# Patient Record
Sex: Female | Born: 1970
Health system: Southern US, Community
[De-identification: ages and names within clinical notes are randomized; demographics above are authoritative.]

## PROBLEM LIST (undated history)

## (undated) DIAGNOSIS — A0472 Enterocolitis due to Clostridium difficile, not specified as recurrent: Secondary | ICD-10-CM

## (undated) DIAGNOSIS — E785 Hyperlipidemia, unspecified: Secondary | ICD-10-CM

## (undated) DIAGNOSIS — E119 Type 2 diabetes mellitus without complications: Secondary | ICD-10-CM

## (undated) DIAGNOSIS — Z9889 Other specified postprocedural states: Secondary | ICD-10-CM

## (undated) DIAGNOSIS — R51 Headache: Secondary | ICD-10-CM

## (undated) DIAGNOSIS — Z800 Family history of malignant neoplasm of digestive organs: Secondary | ICD-10-CM

## (undated) DIAGNOSIS — M5136 Other intervertebral disc degeneration, lumbar region: Secondary | ICD-10-CM

## (undated) DIAGNOSIS — I10 Essential (primary) hypertension: Secondary | ICD-10-CM

## (undated) DIAGNOSIS — Z8041 Family history of malignant neoplasm of ovary: Secondary | ICD-10-CM

## (undated) DIAGNOSIS — M1990 Unspecified osteoarthritis, unspecified site: Secondary | ICD-10-CM

## (undated) DIAGNOSIS — E282 Polycystic ovarian syndrome: Secondary | ICD-10-CM

## (undated) DIAGNOSIS — Z803 Family history of malignant neoplasm of breast: Principal | ICD-10-CM

## (undated) DIAGNOSIS — M51369 Other intervertebral disc degeneration, lumbar region without mention of lumbar back pain or lower extremity pain: Secondary | ICD-10-CM

## (undated) DIAGNOSIS — K219 Gastro-esophageal reflux disease without esophagitis: Secondary | ICD-10-CM

## (undated) DIAGNOSIS — K5792 Diverticulitis of intestine, part unspecified, without perforation or abscess without bleeding: Secondary | ICD-10-CM

## (undated) DIAGNOSIS — N189 Chronic kidney disease, unspecified: Secondary | ICD-10-CM

## (undated) DIAGNOSIS — R112 Nausea with vomiting, unspecified: Secondary | ICD-10-CM

## (undated) HISTORY — DX: Family history of malignant neoplasm of digestive organs: Z80.0

## (undated) HISTORY — DX: Family history of malignant neoplasm of breast: Z80.3

## (undated) HISTORY — DX: Family history of malignant neoplasm of ovary: Z80.41

---

## 1992-05-21 HISTORY — PX: CHOLECYSTECTOMY: SHX55

## 1992-05-21 HISTORY — PX: DILATION AND CURETTAGE OF UTERUS: SHX78

## 1996-05-21 HISTORY — PX: TONSILLECTOMY: SUR1361

## 1997-05-21 HISTORY — PX: LYMPH NODE DISSECTION: SHX5087

## 2004-06-15 ENCOUNTER — Ambulatory Visit: Payer: Self-pay | Admitting: Oncology

## 2004-06-21 ENCOUNTER — Ambulatory Visit: Payer: Self-pay | Admitting: Oncology

## 2004-07-19 ENCOUNTER — Ambulatory Visit: Payer: Self-pay | Admitting: Oncology

## 2004-08-15 ENCOUNTER — Ambulatory Visit: Payer: Self-pay | Admitting: Anesthesiology

## 2004-11-08 ENCOUNTER — Ambulatory Visit: Payer: Self-pay | Admitting: General Practice

## 2005-02-01 ENCOUNTER — Ambulatory Visit: Payer: Self-pay | Admitting: General Practice

## 2005-03-28 ENCOUNTER — Ambulatory Visit: Payer: Self-pay | Admitting: Anesthesiology

## 2005-06-13 ENCOUNTER — Ambulatory Visit: Payer: Self-pay | Admitting: Internal Medicine

## 2005-09-06 ENCOUNTER — Ambulatory Visit: Payer: Self-pay | Admitting: Anesthesiology

## 2005-11-24 ENCOUNTER — Emergency Department: Payer: Self-pay | Admitting: Emergency Medicine

## 2005-12-05 ENCOUNTER — Encounter: Payer: Self-pay | Admitting: Internal Medicine

## 2005-12-18 ENCOUNTER — Ambulatory Visit: Payer: Self-pay | Admitting: Internal Medicine

## 2005-12-19 ENCOUNTER — Encounter: Payer: Self-pay | Admitting: Internal Medicine

## 2006-02-06 ENCOUNTER — Ambulatory Visit: Payer: Self-pay | Admitting: Internal Medicine

## 2006-02-18 ENCOUNTER — Ambulatory Visit: Payer: Self-pay | Admitting: Internal Medicine

## 2006-02-27 ENCOUNTER — Ambulatory Visit: Payer: Self-pay | Admitting: Anesthesiology

## 2006-08-15 ENCOUNTER — Ambulatory Visit: Payer: Self-pay | Admitting: Anesthesiology

## 2006-08-28 ENCOUNTER — Ambulatory Visit: Payer: Self-pay | Admitting: Internal Medicine

## 2007-01-22 ENCOUNTER — Ambulatory Visit: Payer: Self-pay | Admitting: Anesthesiology

## 2007-02-09 ENCOUNTER — Ambulatory Visit: Payer: Self-pay | Admitting: Family Medicine

## 2007-02-21 ENCOUNTER — Ambulatory Visit: Payer: Self-pay | Admitting: Internal Medicine

## 2007-02-25 ENCOUNTER — Ambulatory Visit: Payer: Self-pay | Admitting: Anesthesiology

## 2007-02-28 ENCOUNTER — Ambulatory Visit: Payer: Self-pay | Admitting: Internal Medicine

## 2007-05-22 HISTORY — PX: ABDOMINAL HYSTERECTOMY: SHX81

## 2007-08-27 ENCOUNTER — Ambulatory Visit: Payer: Self-pay | Admitting: Anesthesiology

## 2007-09-29 ENCOUNTER — Ambulatory Visit: Payer: Self-pay | Admitting: Pain Medicine

## 2007-10-22 ENCOUNTER — Ambulatory Visit: Payer: Self-pay | Admitting: Internal Medicine

## 2007-10-30 ENCOUNTER — Ambulatory Visit: Payer: Self-pay | Admitting: Anesthesiology

## 2008-04-05 ENCOUNTER — Ambulatory Visit: Payer: Self-pay | Admitting: Unknown Physician Specialty

## 2008-04-08 ENCOUNTER — Inpatient Hospital Stay: Payer: Self-pay | Admitting: Unknown Physician Specialty

## 2008-05-10 ENCOUNTER — Ambulatory Visit: Payer: Self-pay | Admitting: Anesthesiology

## 2008-10-12 ENCOUNTER — Ambulatory Visit: Payer: Self-pay | Admitting: Internal Medicine

## 2008-11-23 ENCOUNTER — Ambulatory Visit: Payer: Self-pay | Admitting: Anesthesiology

## 2008-12-07 ENCOUNTER — Ambulatory Visit: Payer: Self-pay | Admitting: Internal Medicine

## 2009-03-25 ENCOUNTER — Ambulatory Visit: Payer: Self-pay | Admitting: Internal Medicine

## 2009-05-26 ENCOUNTER — Ambulatory Visit: Payer: Self-pay | Admitting: Anesthesiology

## 2009-11-23 ENCOUNTER — Ambulatory Visit: Payer: Self-pay | Admitting: Anesthesiology

## 2010-02-09 ENCOUNTER — Ambulatory Visit: Payer: Self-pay | Admitting: Internal Medicine

## 2010-05-08 ENCOUNTER — Ambulatory Visit: Payer: Self-pay | Admitting: Anesthesiology

## 2010-11-13 ENCOUNTER — Ambulatory Visit: Payer: Self-pay

## 2011-01-16 ENCOUNTER — Ambulatory Visit: Payer: Self-pay | Admitting: Anesthesiology

## 2011-04-17 ENCOUNTER — Ambulatory Visit: Payer: Self-pay | Admitting: Internal Medicine

## 2011-06-20 ENCOUNTER — Ambulatory Visit: Payer: Self-pay | Admitting: Anesthesiology

## 2012-02-04 ENCOUNTER — Ambulatory Visit: Payer: Self-pay | Admitting: Anesthesiology

## 2012-02-14 ENCOUNTER — Ambulatory Visit: Payer: Self-pay

## 2012-02-14 LAB — URINALYSIS, COMPLETE
Blood: NEGATIVE
Glucose,UR: NEGATIVE mg/dL (ref 0–75)
Ketone: NEGATIVE
Protein: NEGATIVE
Specific Gravity: 1.01 (ref 1.003–1.030)

## 2012-03-16 ENCOUNTER — Ambulatory Visit: Payer: Self-pay

## 2012-04-07 ENCOUNTER — Ambulatory Visit: Payer: Self-pay | Admitting: Anesthesiology

## 2012-06-25 ENCOUNTER — Ambulatory Visit: Payer: Self-pay | Admitting: Internal Medicine

## 2012-08-04 ENCOUNTER — Ambulatory Visit: Payer: Self-pay | Admitting: Anesthesiology

## 2012-11-18 ENCOUNTER — Ambulatory Visit: Payer: Self-pay | Admitting: Anesthesiology

## 2013-01-21 ENCOUNTER — Ambulatory Visit: Payer: Self-pay | Admitting: Orthopedic Surgery

## 2013-02-05 ENCOUNTER — Encounter (HOSPITAL_COMMUNITY)
Admission: RE | Admit: 2013-02-05 | Discharge: 2013-02-05 | Disposition: A | Discharge status: Home / Self Care | Payer: 59 | Source: Ambulatory Visit | Attending: Orthopedic Surgery | Admitting: Orthopedic Surgery

## 2013-02-05 ENCOUNTER — Encounter (HOSPITAL_COMMUNITY)
Admission: RE | Admit: 2013-02-05 | Discharge: 2013-02-05 | Disposition: A | Discharge status: Home / Self Care | Payer: 59 | Source: Ambulatory Visit | Attending: Anesthesiology | Admitting: Anesthesiology

## 2013-02-05 ENCOUNTER — Encounter (HOSPITAL_COMMUNITY): Payer: Self-pay

## 2013-02-05 ENCOUNTER — Encounter (HOSPITAL_COMMUNITY): Payer: Self-pay | Admitting: Pharmacy Technician

## 2013-02-05 DIAGNOSIS — Z01812 Encounter for preprocedural laboratory examination: Secondary | ICD-10-CM | POA: Insufficient documentation

## 2013-02-05 DIAGNOSIS — Z01810 Encounter for preprocedural cardiovascular examination: Secondary | ICD-10-CM | POA: Insufficient documentation

## 2013-02-05 DIAGNOSIS — Z01818 Encounter for other preprocedural examination: Secondary | ICD-10-CM | POA: Insufficient documentation

## 2013-02-05 HISTORY — DX: Essential (primary) hypertension: I10

## 2013-02-05 HISTORY — DX: Nausea with vomiting, unspecified: R11.2

## 2013-02-05 HISTORY — DX: Unspecified osteoarthritis, unspecified site: M19.90

## 2013-02-05 HISTORY — DX: Type 2 diabetes mellitus without complications: E11.9

## 2013-02-05 HISTORY — DX: Headache: R51

## 2013-02-05 HISTORY — DX: Other specified postprocedural states: Z98.890

## 2013-02-05 HISTORY — DX: Gastro-esophageal reflux disease without esophagitis: K21.9

## 2013-02-05 HISTORY — DX: Chronic kidney disease, unspecified: N18.9

## 2013-02-05 LAB — BASIC METABOLIC PANEL
BUN: 15 mg/dL (ref 6–23)
GFR calc Af Amer: >90 mL/min (ref >90)
GFR calc non Af Amer: >90 mL/min (ref >90)
Potassium: 4 mEq/L (ref 3.5–5.1)
Sodium: 134 mEq/L — ABNORMAL LOW (ref 135–145)

## 2013-02-05 LAB — CBC
MCH: 30.3 pg (ref 26.0–34.0)
MCV: 88.7 fL (ref 78.0–100.0)
Platelets: 251 10*3/uL (ref 150–400)
RBC: 4.26 MIL/uL (ref 3.87–5.11)
RDW: 12.5 % (ref 11.5–15.5)
WBC: 7.5 10*3/uL (ref 4.0–10.5)

## 2013-02-05 NOTE — Anesthesia Preprocedure Evaluation (Addendum)
Anesthesia Evaluation  Patient identified by MRN, date of birth, ID band Patient awake    History of Anesthesia Complications (+) PONV  Airway Mallampati: II TM Distance: >3 FB Neck ROM: Limited    Dental   Pulmonary  breath sounds clear to auscultation        Cardiovascular hypertension, Rhythm:Regular Rate:Normal     Neuro/Psych  Headaches,    GI/Hepatic GERD-  ,  Endo/Other  diabetesMorbid obesity  Renal/GU      Musculoskeletal   Abdominal (+) + obese,   Peds  Hematology   Anesthesia Other Findings   Reproductive/Obstetrics                           Anesthesia Physical Anesthesia Plan  ASA: III  Anesthesia Plan: General   Post-op Pain Management:    Induction: Intravenous  Airway Management Planned: Oral ETT  Additional Equipment:   Intra-op Plan:   Post-operative Plan: Extubation in OR  Informed Consent: I have reviewed the patients History and Physical, chart, labs and discussed the procedure including the risks, benefits and alternatives for the proposed anesthesia with the patient or authorized representative who has indicated his/her understanding and acceptance.     Plan Discussed with: CRNA and Surgeon  Anesthesia Plan Comments: (See my note regarding anesthesia concerns: IV access, N/V.  Shonna Chock, PA-C)       Anesthesia Quick Evaluation

## 2013-02-05 NOTE — Progress Notes (Signed)
Anesthesia Note:  Patient is a 42 year old female scheduled for left shoulder arthroscopy with subacromial decompression, distal clavicle resection, possible bicep tenotomy, possible rotator cuff repair on 02/12/13 by Dr. Rennis Chris.  History includes Cat-scratch fever with right neck cervical LN dissection in 1991 with development of RUE lymphedema 10 years later, HTN, DM2 (reported A1C of 6.1), morbid obesity (BMI 41.98; 116 kg), GERD, headaches, non-smoker, arthritis, tonsillectomy, cholecystectomy, hysterectomy.  PCP is Dr. Bethann Punches.  She is an Nurse, learning disability at [employer].  Anesthesia concerns:  Her RUE is restricted due to lymphedema history.  She reports that Dr. Rennis Chris said she could not have a PIV in her LUE at all (even in hand) since he will be working on her left shoulder.  She would like an IV in her foot for this procedure.  She also requests that if she receives propofol she will need Zofran preoperatively to prevent N/V.    EKG on 02/05/13 showed NSR, cannot rule out anterior infarct. She is unsure if she has ever had a prior EKG.  She denies history of stress and echo.  She denies SOB or recent history of chest pain.  She is able to still work on the [employer] floor. Up until a month ago when she began having shoulder issues, she was going to the gym and working with a Psychologist, educational.  She was able to lose 80 lb by doing 30-40 minutes of cardio and 60 minutes of weight training.  She denied any chest pain during these sessions.  She has had recent good exercise tolerance > 4 METS, so I do not think she will require further cardiac evaluation at this time.  Preoperative CXR and labs noted.  Reviewed foot IV request with anesthesiologists Dr. Jacklynn Bue and Dr. Michelle Piper.  Will attempt to obtain IV access in her foot on the day of surgery, but if unsuccessful a neck IV will need to be considered.  Patient updated, but she is adamant that she only wants a left hand IV (which Dr. Rennis Chris said "no") or a foot IV.  She will be  further evaluated by her assigned anesthesiologist on the day of surgery to discuss the definitve anesthesia plan.  Velna Ochs [facility] Stay [facility]/Anesthesiology Phone [phone] 02/05/2013 3:04 PM

## 2013-02-05 NOTE — Pre-Procedure Instructions (Signed)
KAMYLAH MANZO  02/05/2013   Your procedure is scheduled on:  Thursday February 12, 2013  Report to [facility] at 0800 AM.  Call this number if you have problems the morning of surgery: [phone]   Remember:   Do not eat food or drink liquids after midnight Wednesday   Take these medicines the morning of surgery with A SIP OF WATER: None. Take medication at night.   Do not wear jewelry, make-up or nail polish.  Do not wear lotions, powders, or perfumes. You may wear deodorant.  Do not shave 48 hours prior to surgery.   Do not bring valuables to the hospital.  [facility] is not responsible for any belongings or valuables.  Contacts, dentures or bridgework may not be worn into surgery.  Leave suitcase in the car. After surgery it may be brought to your room.  For patients admitted to the hospital, checkout time is 11:00 AM the day of discharge.   Patients discharged the day of surgery will not be allowed to drive home.  Name and phone number of your driver:  Special Instructions: Shower using CHG 2 nights before surgery and the night before surgery.  If you shower the day of surgery use CHG.  Use special wash - you have one bottle of CHG for all showers.  You should use approximately 1/3 of the bottle for each shower.   Please read over the following fact sheets that you were given: Pain Booklet, Coughing and Deep Breathing and Surgical Site Infection Prevention

## 2013-02-11 MED ORDER — DEXTROSE 5 % IV SOLN
3.0000 g | INTRAVENOUS | Status: AC
  Administered 2013-02-12: 3 g via INTRAVENOUS
  Filled 2013-02-11: qty 3000

## 2013-02-12 ENCOUNTER — Ambulatory Visit (HOSPITAL_COMMUNITY)
Admission: RE | Admit: 2013-02-12 | Discharge: 2013-02-12 | Disposition: A | Discharge status: Home / Self Care | Payer: 59 | Source: Ambulatory Visit | Attending: Orthopedic Surgery | Admitting: Orthopedic Surgery

## 2013-02-12 ENCOUNTER — Encounter (HOSPITAL_COMMUNITY)
Admission: RE | Disposition: A | Discharge status: Home / Self Care | Payer: Self-pay | Source: Ambulatory Visit | Attending: Orthopedic Surgery

## 2013-02-12 ENCOUNTER — Encounter (HOSPITAL_COMMUNITY): Payer: Self-pay | Admitting: *Deleted

## 2013-02-12 ENCOUNTER — Encounter (HOSPITAL_COMMUNITY): Payer: Self-pay | Admitting: Vascular Surgery

## 2013-02-12 ENCOUNTER — Ambulatory Visit (HOSPITAL_COMMUNITY): Payer: 59 | Admitting: *Deleted

## 2013-02-12 DIAGNOSIS — M75110 Incomplete rotator cuff tear or rupture of unspecified shoulder, not specified as traumatic: Secondary | ICD-10-CM | POA: Insufficient documentation

## 2013-02-12 DIAGNOSIS — I10 Essential (primary) hypertension: Secondary | ICD-10-CM | POA: Insufficient documentation

## 2013-02-12 DIAGNOSIS — M19019 Primary osteoarthritis, unspecified shoulder: Secondary | ICD-10-CM | POA: Insufficient documentation

## 2013-02-12 DIAGNOSIS — M7542 Impingement syndrome of left shoulder: Secondary | ICD-10-CM

## 2013-02-12 DIAGNOSIS — E119 Type 2 diabetes mellitus without complications: Secondary | ICD-10-CM | POA: Insufficient documentation

## 2013-02-12 DIAGNOSIS — M25819 Other specified joint disorders, unspecified shoulder: Secondary | ICD-10-CM | POA: Insufficient documentation

## 2013-02-12 DIAGNOSIS — Z79899 Other long term (current) drug therapy: Secondary | ICD-10-CM | POA: Insufficient documentation

## 2013-02-12 HISTORY — PX: SHOULDER ARTHROSCOPY WITH ROTATOR CUFF REPAIR AND SUBACROMIAL DECOMPRESSION: SHX5686

## 2013-02-12 SURGERY — SHOULDER ARTHROSCOPY WITH ROTATOR CUFF REPAIR AND SUBACROMIAL DECOMPRESSION
Anesthesia: General | Site: Shoulder | Laterality: Left | Wound class: Clean

## 2013-02-12 MED ORDER — DEXAMETHASONE SODIUM PHOSPHATE 4 MG/ML IJ SOLN
INTRAMUSCULAR | Status: DC | PRN
  Administered 2013-02-12: 4 mg via INTRAVENOUS
  Administered 2013-02-12: 4 mg

## 2013-02-12 MED ORDER — FENTANYL CITRATE 0.05 MG/ML IJ SOLN
100.0000 ug | Freq: Once | INTRAMUSCULAR | Status: AC
  Administered 2013-02-12: 100 ug via INTRAVENOUS

## 2013-02-12 MED ORDER — ONDANSETRON HCL 4 MG PO TABS: 4.0000 mg | ORAL_TABLET | Freq: Four times a day (QID) | ORAL | Status: DC | PRN

## 2013-02-12 MED ORDER — PROMETHAZINE HCL 25 MG/ML IJ SOLN: 6.2500 mg | INTRAMUSCULAR | Status: DC | PRN

## 2013-02-12 MED ORDER — METHOCARBAMOL 500 MG PO TABS: 500.0000 mg | ORAL_TABLET | Freq: Four times a day (QID) | ORAL | Status: DC

## 2013-02-12 MED ORDER — METOCLOPRAMIDE HCL 5 MG/ML IJ SOLN: 5.0000 mg | Freq: Three times a day (TID) | INTRAMUSCULAR | Status: DC | PRN

## 2013-02-12 MED ORDER — MIDAZOLAM HCL 5 MG/5ML IJ SOLN
INTRAMUSCULAR | Status: DC | PRN
  Administered 2013-02-12: 2 mg via INTRAVENOUS

## 2013-02-12 MED ORDER — ONDANSETRON HCL 4 MG/2ML IJ SOLN
INTRAMUSCULAR | Status: DC | PRN
  Administered 2013-02-12 (×2): 4 mg via INTRAVENOUS

## 2013-02-12 MED ORDER — PROPOFOL 10 MG/ML IV BOLUS
INTRAVENOUS | Status: DC | PRN
  Administered 2013-02-12: 200 mg via INTRAVENOUS
  Administered 2013-02-12: 90 mg via INTRAVENOUS

## 2013-02-12 MED ORDER — SUCCINYLCHOLINE CHLORIDE 20 MG/ML IJ SOLN
INTRAMUSCULAR | Status: DC | PRN
  Administered 2013-02-12: 160 mg via INTRAVENOUS

## 2013-02-12 MED ORDER — ACETAMINOPHEN 325 MG PO TABS
ORAL_TABLET | ORAL | Status: AC
  Filled 2013-02-12: qty 2

## 2013-02-12 MED ORDER — BUPIVACAINE-EPINEPHRINE PF 0.5-1:200000 % IJ SOLN
INTRAMUSCULAR | Status: DC | PRN
  Administered 2013-02-12: 25 mL

## 2013-02-12 MED ORDER — LIDOCAINE HCL (CARDIAC) 20 MG/ML IV SOLN
INTRAVENOUS | Status: DC | PRN
  Administered 2013-02-12: 100 mg via INTRAVENOUS

## 2013-02-12 MED ORDER — MIDAZOLAM HCL 5 MG/ML IJ SOLN
2.0000 mg | Freq: Once | INTRAMUSCULAR | Status: AC
  Administered 2013-02-12: 2 mg via INTRAVENOUS

## 2013-02-12 MED ORDER — FENTANYL CITRATE 0.05 MG/ML IJ SOLN
50.0000 ug | Freq: Once | INTRAMUSCULAR | Status: AC
  Administered 2013-02-12: 100 ug via INTRAVENOUS

## 2013-02-12 MED ORDER — FENTANYL CITRATE 0.05 MG/ML IJ SOLN
INTRAMUSCULAR | Status: AC
  Administered 2013-02-12: 100 ug via INTRAVENOUS
  Filled 2013-02-12: qty 4

## 2013-02-12 MED ORDER — SODIUM CHLORIDE 0.9 % IR SOLN
Status: DC | PRN
  Administered 2013-02-12 (×2): 3000 mL

## 2013-02-12 MED ORDER — ONDANSETRON HCL 4 MG/2ML IJ SOLN: 4.0000 mg | Freq: Four times a day (QID) | INTRAMUSCULAR | Status: DC | PRN

## 2013-02-12 MED ORDER — LACTATED RINGERS IV SOLN
INTRAVENOUS | Status: DC
  Administered 2013-02-12: 09:00:00 via INTRAVENOUS

## 2013-02-12 MED ORDER — MIDAZOLAM HCL 2 MG/2ML IJ SOLN
INTRAMUSCULAR | Status: AC
  Administered 2013-02-12: 2 mg
  Filled 2013-02-12: qty 4

## 2013-02-12 MED ORDER — METOCLOPRAMIDE HCL 5 MG PO TABS: 5.0000 mg | ORAL_TABLET | Freq: Three times a day (TID) | ORAL | Status: DC | PRN

## 2013-02-12 MED ORDER — OXYCODONE-ACETAMINOPHEN 5-325 MG PO TABS: 1.0000 | ORAL_TABLET | ORAL | Status: DC | PRN

## 2013-02-12 MED ORDER — ACETAMINOPHEN 325 MG PO TABS
650.0000 mg | ORAL_TABLET | Freq: Once | ORAL | Status: AC
  Administered 2013-02-12: 650 mg via ORAL

## 2013-02-12 MED ORDER — NAPROXEN 500 MG PO TABS: 500.0000 mg | ORAL_TABLET | Freq: Two times a day (BID) | ORAL | Status: DC

## 2013-02-12 MED ORDER — CHLORHEXIDINE GLUCONATE 4 % EX LIQD: 60.0000 mL | Freq: Once | CUTANEOUS | Status: DC

## 2013-02-12 MED ORDER — HYDROMORPHONE HCL PF 1 MG/ML IJ SOLN: 0.2500 mg | INTRAMUSCULAR | Status: DC | PRN

## 2013-02-12 MED ORDER — LACTATED RINGERS IV SOLN: INTRAVENOUS | Status: DC

## 2013-02-12 MED ORDER — LACTATED RINGERS IV SOLN
INTRAVENOUS | Status: DC | PRN
  Administered 2013-02-12 (×2): via INTRAVENOUS

## 2013-02-12 MED ORDER — MIDAZOLAM HCL 2 MG/2ML IJ SOLN: 2.0000 mg | Freq: Once | INTRAMUSCULAR | Status: DC

## 2013-02-12 MED ORDER — TEMAZEPAM 30 MG PO CAPS: 30.0000 mg | ORAL_CAPSULE | Freq: Every evening | ORAL | Status: DC | PRN

## 2013-02-12 SURGICAL SUPPLY — 56 items
BLADE CUTTER GATOR 3.5 (BLADE) ×2 IMPLANT
BLADE GREAT WHITE 4.2 (BLADE) ×2 IMPLANT
BLADE SURG 11 STRL SS (BLADE) ×2 IMPLANT
BOOTCOVER CLEANROOM LRG (PROTECTIVE WEAR) ×4 IMPLANT
BUR 3.5 LG SPHERICAL (BURR) ×1 IMPLANT
BUR OVAL 4.0 (BURR) ×2 IMPLANT
BURR 3.5 LG SPHERICAL (BURR) ×2
CANISTER SUCT LVC 12 LTR MEDI- (MISCELLANEOUS) ×2 IMPLANT
CANNULA ACUFLEX KIT 5X76 (CANNULA) ×2 IMPLANT
CANNULA DRILOCK 5.0X75 (CANNULA) IMPLANT
CLOTH BEACON ORANGE TIMEOUT ST (SAFETY) ×2 IMPLANT
CONNECTOR 5 IN 1 STRAIGHT STRL (MISCELLANEOUS) ×2 IMPLANT
DRAPE INCISE 23X17 IOBAN STRL (DRAPES) ×1
DRAPE INCISE IOBAN 23X17 STRL (DRAPES) ×1 IMPLANT
DRAPE INCISE IOBAN 66X45 STRL (DRAPES) ×2 IMPLANT
DRAPE STERI 35X30 U-POUCH (DRAPES) ×2 IMPLANT
DRAPE SURG 17X11 SM STRL (DRAPES) ×2 IMPLANT
DRAPE U-SHAPE 47X51 STRL (DRAPES) ×2 IMPLANT
DRSG PAD ABDOMINAL 8X10 ST (GAUZE/BANDAGES/DRESSINGS) ×2 IMPLANT
DURAPREP 26ML APPLICATOR (WOUND CARE) ×4 IMPLANT
ELECT REM PT RETURN 9FT ADLT (ELECTROSURGICAL)
ELECTRODE REM PT RTRN 9FT ADLT (ELECTROSURGICAL) IMPLANT
GLOVE BIO SURGEON STRL SZ7.5 (GLOVE) ×2 IMPLANT
GLOVE BIO SURGEON STRL SZ8 (GLOVE) ×2 IMPLANT
GLOVE EUDERMIC 7 POWDERFREE (GLOVE) ×2 IMPLANT
GLOVE SS BIOGEL STRL SZ 7.5 (GLOVE) ×1 IMPLANT
GLOVE SUPERSENSE BIOGEL SZ 7.5 (GLOVE) ×1
GOWN STRL NON-REIN LRG LVL3 (GOWN DISPOSABLE) ×2 IMPLANT
GOWN STRL REIN XL XLG (GOWN DISPOSABLE) ×8 IMPLANT
KIT BASIN OR (CUSTOM PROCEDURE TRAY) ×2 IMPLANT
KIT ROOM TURNOVER OR (KITS) ×2 IMPLANT
KIT SHOULDER TRACTION (DRAPES) ×2 IMPLANT
MANIFOLD NEPTUNE II (INSTRUMENTS) ×2 IMPLANT
NDL SUT 6 .5 CRC .975X.05 MAYO (NEEDLE) IMPLANT
NEEDLE MAYO TAPER (NEEDLE)
NEEDLE SPNL 18GX3.5 QUINCKE PK (NEEDLE) ×2 IMPLANT
NS IRRIG 1000ML POUR BTL (IV SOLUTION) ×2 IMPLANT
PACK SHOULDER (CUSTOM PROCEDURE TRAY) ×2 IMPLANT
PAD ARMBOARD 7.5X6 YLW CONV (MISCELLANEOUS) ×4 IMPLANT
SET ARTHROSCOPY TUBING (MISCELLANEOUS) ×1
SET ARTHROSCOPY TUBING LN (MISCELLANEOUS) ×1 IMPLANT
SLING ARM FOAM STRAP LRG (SOFTGOODS) ×2 IMPLANT
SLING ARM FOAM STRAP MED (SOFTGOODS) ×2 IMPLANT
SPONGE GAUZE 4X4 12PLY (GAUZE/BANDAGES/DRESSINGS) ×2 IMPLANT
SPONGE LAP 4X18 X RAY DECT (DISPOSABLE) ×2 IMPLANT
STRIP CLOSURE SKIN 1/2X4 (GAUZE/BANDAGES/DRESSINGS) ×2 IMPLANT
SUT MNCRL AB 3-0 PS2 18 (SUTURE) ×2 IMPLANT
SUT PDS AB 0 CT 36 (SUTURE) IMPLANT
SUT RETRIEVER GRASP 30 DEG (SUTURE) IMPLANT
SYR 20CC LL (SYRINGE) ×2 IMPLANT
TAPE CLOTH SURG 4X10 WHT LF (GAUZE/BANDAGES/DRESSINGS) ×2 IMPLANT
TAPE PAPER 3X10 WHT MICROPORE (GAUZE/BANDAGES/DRESSINGS) ×2 IMPLANT
TOWEL OR 17X24 6PK STRL BLUE (TOWEL DISPOSABLE) ×2 IMPLANT
TOWEL OR 17X26 10 PK STRL BLUE (TOWEL DISPOSABLE) ×2 IMPLANT
WAND SUCTION MAX 4MM 90S (SURGICAL WAND) ×2 IMPLANT
WATER STERILE IRR 1000ML POUR (IV SOLUTION) ×2 IMPLANT

## 2013-02-12 NOTE — Op Note (Signed)
02/12/2013  12:00 PM  PATIENT:   Charlene Daniels  42 y.o. female  PRE-OPERATIVE DIAGNOSIS:  LEFT SHOULDER IMPINGEMENT  POST-OPERATIVE DIAGNOSIS:  Same with AC joint OA and partial articular rotator cuff tear and labral tear.  PROCEDURE:  LSA, labral and rotator cuff debridement, SAD, DCR  SURGEON:  Emryn Flanery, Vania Rea. M.D.  ASSISTANTS: Roberts pac   ANESTHESIA:   GET + ISB  EBL: min  SPECIMEN:  none  Drains: none   PATIENT DISPOSITION:  PACU - hemodynamically stable.    PLAN OF CARE: Discharge to home after PACU  Dictation# [id number]

## 2013-02-12 NOTE — H&P (Signed)
Charlene Daniels    Chief Complaint: LEFT SHOULDER IMPINGEMENT HPI: The patient is a 42 y.o. female with chronic left shoulder impingment and AC joint OA  Past Medical History  Diagnosis Date  . PONV (postoperative nausea and vomiting)   . Diabetes mellitus without complication   . Hypertension   . Chronic kidney disease     on meds to protect kidney from DM  . GERD (gastroesophageal reflux disease)   . Headache(784.0)   . Arthritis     Past Surgical History  Procedure Laterality Date  . Tonsillectomy  1998  . Cholecystectomy  1994  . Dilation and curettage of uterus  1994  . Abdominal hysterectomy  2009  . Lymph node dissection Right 1999    right neck cervical LN dissection after Cat-scratch fever    No family history on file.  Social History:  reports that she has never smoked. She has never used smokeless tobacco. She reports that  drinks alcohol. She reports that she does not use illicit drugs.  Allergies:  Allergies  Allergen Reactions  . Darvocet [Propoxyphene-Acetaminophen] Nausea Only and Other (See Comments)    headache  . Ultram [Tramadol] Other (See Comments)    hallucinations  . Sulfa Antibiotics Rash    Rash on wrist    Medications Prior to Admission  Medication Sig Dispense Refill  . acetaminophen (TYLENOL) 325 MG tablet Take 650 mg by mouth daily as needed for pain. For pain      . aspirin 81 MG tablet Take 81 mg by mouth daily as needed for pain. For pain/blood thinner      . Cholecalciferol (VITAMIN D-3) 5000 UNITS TABS Take 1 tablet by mouth daily.      . Cyanocobalamin (VITAMIN B-12 PO) Take 1 tablet by mouth daily.      Marland Kitchen estradiol (ESTRACE) 1 MG tablet Take 0.5 mg by mouth at bedtime.       Marland Kitchen HYDROcodone-acetaminophen (NORCO/VICODIN) 5-325 MG per tablet Take 1-2 tablets by mouth daily as needed for pain. For pain      . ibuprofen (ADVIL,MOTRIN) 200 MG tablet Take 200 mg by mouth daily as needed for pain. For pain      .  lisinopril-hydrochlorothiazide (PRINZIDE,ZESTORETIC) 20-12.5 MG per tablet Take 0.5 tablets by mouth at bedtime.       . metFORMIN (GLUCOPHAGE) 1000 MG tablet Take 1,000 mg by mouth 2 (two) times daily with a meal.      . omeprazole (PRILOSEC) 20 MG capsule Take 20 mg by mouth daily.      . Probiotic Product (PROBIOTIC PO) Take 1 tablet by mouth daily.      Marland Kitchen spironolactone (ALDACTONE) 50 MG tablet Take 50 mg by mouth daily.      . tiaGABine (GABITRIL) 4 MG tablet Take 4-8 mg by mouth 2 (two) times daily. 4mg  in the am 8mg  at night         Physical Exam: left shoulder with painful and restricted motion as noted at recent office visit.  Vitals     Assessment/Plan  Impression: LEFT SHOULDER IMPINGEMENT  Plan of Action: Procedure(s): LEFT SHOULDER ARTHROSCOPY WITH SUBACROMIAL DECOMPRESSION, DISTAL CLAVICLE RESECTION, POSSIBLE BICEP TENOTOMY, POSSIBLE ROTATOR CUFF REPAIR  Javeon Macmurray M 02/12/2013, 9:54 AM

## 2013-02-12 NOTE — Anesthesia Postprocedure Evaluation (Signed)
  Anesthesia Post-op Note  Patient: Charlene Daniels  Procedure(s) Performed: Procedure(s): LEFT SHOULDER ARTHROSCOPY WITH SUBACROMIAL DECOMPRESSION, DISTAL CLAVICLE RESECTION, POSSIBLE BICEP TENOTOMY, POSSIBLE ROTATOR CUFF REPAIR (Left)  Patient Location: PACU  Anesthesia Type:GA combined with regional for post-op pain  Level of Consciousness: awake and alert   Airway and Oxygen Therapy: Patient Spontanous Breathing  Post-op Pain: none  Post-op Assessment: Post-op Vital signs reviewed, Patient's Cardiovascular Status Stable, Respiratory Function Stable, Patent Airway, No signs of Nausea or vomiting and Pain level controlled  Post-op Vital Signs: Reviewed and stable  Complications: No apparent anesthesia complications

## 2013-02-12 NOTE — Progress Notes (Signed)
Orthopedic Tech Progress Note Patient Details:  Charlene Daniels [birth year] [id number]  Ortho Devices Type of Ortho Device: Sling immobilizer Ortho Device/Splint Interventions: Application   Shawnie Pons 02/12/2013, 2:29 PM

## 2013-02-12 NOTE — Transfer of Care (Signed)
Immediate Anesthesia Transfer of Care Note  Patient: Doyce Para  Procedure(s) Performed: Procedure(s): LEFT SHOULDER ARTHROSCOPY WITH SUBACROMIAL DECOMPRESSION, DISTAL CLAVICLE RESECTION, POSSIBLE BICEP TENOTOMY, POSSIBLE ROTATOR CUFF REPAIR (Left)  Patient Location: PACU  Anesthesia Type:General and Regional  Level of Consciousness: patient cooperative and responds to stimulation  Airway & Oxygen Therapy: Patient Spontanous Breathing and Patient connected to nasal cannula oxygen  Post-op Assessment: Report given to PACU RN and Post -op Vital signs reviewed and stable  Post vital signs: Reviewed and stable  Complications: No apparent anesthesia complications

## 2013-02-12 NOTE — Anesthesia Procedure Notes (Signed)
Anesthesia Regional Block:  Interscalene brachial plexus block  Pre-Anesthetic Checklist: ,, timeout performed, Correct Patient, Correct Site, Correct Laterality, Correct Procedure, Correct Position, site marked, Risks and benefits discussed,  Surgical consent,  Pre-op evaluation,  At surgeon's request and post-op pain management  Laterality: Left  Prep: chloraprep       Needles:   Needle Type: Echogenic Stimulator Needle     Needle Length: 5cm 5 cm Needle Gauge: 22 and 22 G    Additional Needles:  Procedures: ultrasound guided (picture in chart) and nerve stimulator Interscalene brachial plexus block  Nerve Stimulator or Paresthesia:  Response: 0.48 mA,   Additional Responses:   Narrative:  Start time: 02/12/2013 9:47 AM End time: 02/12/2013 10:04 AM Injection made incrementally with aspirations every 3 mL. Anesthesiologist: Dr Gypsy Balsam  Additional Notes: 1610-9604 L ISB POP CHG prep, sterile tech #22 stim/echo needle with stim down to .48ma and good [place] visualization-PIX in chart Marc .5% w/epi 1:200000 total 25cc+ decadron 4mg  infiltrated Multiple neg asp No compl Dr Gypsy Balsam

## 2013-02-12 NOTE — Preoperative (Signed)
Beta Blockers   Reason not to administer Beta Blockers:Not Applicable 

## 2013-02-13 NOTE — Op Note (Signed)
Charlene Daniels, Charlene Daniels                ACCOUNT NO.:  [id number]  MEDICAL RECORD NO.:  [id number]  LOCATION:  [facility]                         FACILITY:  [facility]  PHYSICIAN:  Vania Rea. Farhaan Mabee, M.D.  DATE OF BIRTH:  12/30/1970  DATE OF PROCEDURE:  02/12/2013 DATE OF DISCHARGE:  02/12/2013                              OPERATIVE REPORT   PREOPERATIVE DIAGNOSES: 1. Chronic left shoulder impingement syndrome. 2. Left shoulder acromioclavicular joint arthrosis.  POSTOPERATIVE DIAGNOSES: 1. Chronic left shoulder impingement syndrome. 2. Left shoulder acromioclavicular joint arthrosis. 3. Left shoulder partial articular rotator cuff tear. 4. Left shoulder labral tear.  PROCEDURES: 1. Left shoulder examination under anesthesia. 2. Left shoulder diagnostic arthroscopy. 3. Debridement of degenerative labral tear. 4. Debridement of partial articular rotator cuff tear. 5. Arthroscopic subacromial decompression and bursectomy. 6. Arthroscopic distal clavicle resection.  SURGEON:  Vania Rea. Lynnsey Barbara, MD  ASSISTANT:  Skip Mayer, PA-C  ANESTHESIA:  General endotracheal as well as interscalene block.  ESTIMATED BLOOD LOSS:  Minimal.  DRAINS:  None.  HISTORY:  Charlene Daniels is a 42 year old female who has a chronic and progressive increasing left shoulder pain and impingement syndrome.  It has been refractory to prolonged and multiple attempts at conservative management.  She was brought to the operating room at this time for planned left shoulder arthroscopy as described below.  Preoperatively, I counseled Charlene Daniels on treatment options as well as risks versus benefits thereof.  Possible surgical complications were reviewed with potential for bleeding, infection, neurovascular injury, persistent pain, loss of motion, anesthetic complication, and possible need for additional surgery.  She understands and accepts and agrees with the planned procedure.  PROCEDURE IN DETAIL:  After undergoing routine  preoperative evaluation, the patient received prophylactic antibiotics and an interscalene block was established in the holding area by the Anesthesia Department. Placed supine on the operating table, underwent smooth induction of a general endotracheal anesthesia.  Turned to the right lateral decubitus position on a beanbag and appropriately padded and protected.  Left shoulder examination under anesthesia revealed full motion.  No obvious instability patterns were noted.  Left arm suspended at 70 degrees of abduction and 15 pounds of traction.  The left shoulder girdle region was sterilely prepped and draped in standard fashion.  Time-out was called.  Posterior portal was established in the glenohumeral joint. Anterior portal was established under direct visualization.  The glenohumeral articular surfaces were all in good condition.  No instability patterns noted.  There was, however, significant tearing of the anterior and superior labrum which was debrided with shaver.  The biceps anchor was stable.  Biceps tendon normal caliber.  There was a partial articular sided rotator cuff tear involving of my estimate 10% to 15% of thickness of the tendon.  This was debrided with a shaver in the region of the distal supraspinatus.  The remaining inspection of the glenohumeral joint showed no obvious additional pathologies.  At this point, fluid and instruments wee removed.  The arm dropped down to 30 degrees of abduction with the arthroscope in the region of the subacromial space at the posterior portal and direct lateral portal was established in the subacromial space.  Abundant dense bursal  tissue and multiple adhesions were encountered and these were all divided and excised from the shaver and Stryker wand.  The wand was then used to remove the periosteum from the undersurface of the anterior half of the acromion.  The subacromial decompression was performed with a burr, creating type 1  morphology.  Portal was then established directly into the distal clavicle and distal clavicle resection was performed with a burr.  Care was taken to confirm visualization of the entire circumference of the distal clavicle to ensure adequate removal of bone. We then completed the subacromial/subdeltoid bursectomy.  The bursal surface of the rotator cuff was carefully inspected and found to be intact with some minimal superficial fraying, but no full-thickness defects or significant attenuation.  At this point, final hemostasis was obtained.  Fluid and instruments were removed.  Portals were closed with Monocryl and Steri-Strips.  Dry dressing taped to the left shoulder. Left arm was placed in a sling.  The patient was awakened, extubated, and taken to the recovery room in stable condition.  Surgical assistant, Skip Mayer, required throughout this case, essential for help with positioning the extremity and management of the arthroscopic equipment, tissue manipulation, wound closure, patient positioning in consideration of her morbid obesity, and intraoperative decision making.     Vania Rea. Nielle Duford, M.D.     KMS/MEDQ  D:  02/12/2013  T:  02/13/2013  Job:  [id number]

## 2013-02-17 ENCOUNTER — Encounter (HOSPITAL_COMMUNITY): Payer: Self-pay | Admitting: Orthopedic Surgery

## 2013-02-18 ENCOUNTER — Encounter: Payer: Self-pay | Admitting: Orthopedic Surgery

## 2013-03-21 ENCOUNTER — Encounter: Payer: Self-pay | Admitting: Orthopedic Surgery

## 2013-04-20 ENCOUNTER — Encounter: Payer: Self-pay | Admitting: Orthopedic Surgery

## 2013-05-21 ENCOUNTER — Encounter: Payer: Self-pay | Admitting: Orthopedic Surgery

## 2013-08-21 ENCOUNTER — Ambulatory Visit: Payer: Self-pay | Admitting: Internal Medicine

## 2014-01-12 ENCOUNTER — Emergency Department: Payer: Self-pay | Admitting: Emergency Medicine

## 2014-01-12 LAB — CBC
HCT: 39.5 % (ref 35.0–47.0)
HGB: 13.2 g/dL (ref 12.0–16.0)
MCH: 29.9 pg (ref 26.0–34.0)
MCHC: 33.5 g/dL (ref 32.0–36.0)
MCV: 89 fL (ref 80–100)
Platelet: 243 10*3/uL (ref 150–440)
RBC: 4.43 10*6/uL (ref 3.80–5.20)
RDW: 13.1 % (ref 11.5–14.5)
WBC: 7.6 10*3/uL (ref 3.6–11.0)

## 2014-01-12 LAB — BASIC METABOLIC PANEL
Anion Gap: 12 (ref 7–16)
BUN: 11 mg/dL (ref 7–18)
CHLORIDE: 102 mmol/L (ref 98–107)
Calcium, Total: 8.7 mg/dL (ref 8.5–10.1)
Co2: 22 mmol/L (ref 21–32)
Creatinine: 0.73 mg/dL (ref 0.60–1.30)
EGFR (Non-African Amer.): >60
GLUCOSE: 156 mg/dL — AB (ref 65–99)
Osmolality: 275 (ref 275–301)
Potassium: 4.1 mmol/L (ref 3.5–5.1)
Sodium: 136 mmol/L (ref 136–145)

## 2014-01-12 LAB — TROPONIN I
Troponin-I: <0.02 ng/mL
Troponin-I: <0.02 ng/mL

## 2014-04-06 ENCOUNTER — Encounter: Payer: Self-pay | Admitting: Unknown Physician Specialty

## 2014-04-20 ENCOUNTER — Encounter: Payer: Self-pay | Admitting: Unknown Physician Specialty

## 2014-05-21 ENCOUNTER — Encounter: Payer: Self-pay | Admitting: Unknown Physician Specialty

## 2014-06-21 ENCOUNTER — Encounter: Payer: Self-pay | Admitting: Unknown Physician Specialty

## 2014-07-08 ENCOUNTER — Ambulatory Visit: Payer: Self-pay | Admitting: Internal Medicine

## 2014-07-20 ENCOUNTER — Encounter
Admit: 2014-07-20 | Disposition: A | Discharge status: Home / Self Care | Payer: Self-pay | Attending: Unknown Physician Specialty | Admitting: Unknown Physician Specialty

## 2014-08-20 ENCOUNTER — Encounter
Admit: 2014-08-20 | Disposition: A | Discharge status: Home / Self Care | Payer: Self-pay | Attending: Unknown Physician Specialty | Admitting: Unknown Physician Specialty

## 2014-08-27 ENCOUNTER — Ambulatory Visit
Admit: 2014-08-27 | Disposition: A | Discharge status: Home / Self Care | Payer: Self-pay | Attending: Internal Medicine | Admitting: Internal Medicine

## 2014-08-31 ENCOUNTER — Encounter: Payer: Self-pay | Admitting: *Deleted

## 2015-01-18 ENCOUNTER — Encounter: Payer: 59 | Attending: Internal Medicine | Admitting: Dietician

## 2015-01-18 ENCOUNTER — Encounter: Payer: Self-pay | Admitting: Dietician

## 2015-01-18 VITALS — Ht 65.5 in | Wt 279.0 lb

## 2015-01-18 DIAGNOSIS — E119 Type 2 diabetes mellitus without complications: Secondary | ICD-10-CM | POA: Insufficient documentation

## 2015-01-18 NOTE — Patient Instructions (Signed)
Balance meals with 1-3 servings of protein, 2-3 servings of carbohydrate and non-starchy vegetables. Spread 10-11 servings of carbohydrate over 3 meals and 1-2 snacks. Limit diet mountain dew and increase water. Limit fries at lunch to once a week vs. 3 x/week. Measure out some portions especially of starchy foods.

## 2015-01-18 NOTE — Progress Notes (Signed)
Medical Nutrition Therapy: Visit start time: 3:10 end time: 4:10pm Assessment:  Diagnosis: Type 2 Diabetes Past medical history: Chronic neck pain; GERD, obesity Psychosocial issues/ stress concerns: Patient rates her stress as moderate and indicates "ok" as to how she is dealing with her stress. Preferred learning method:  . Auditory . Visual  Current weight: 279 lbs  Height: 65.5 in Medications, supplements: see list Progress and evaluation: Patient in for nutrition assessment/instruction as part of obtaining [employer] badge. She was diagnosed with diabetes in 2009. She lost approx. 50 lbs at that time through exercise and diet changes. She expresses frustration at efforts to lose weight now and states she may lose 4-5 lbs but then plateaus and becomes discouraged. Reports her last HgA1c was 6.8. Her main beverages are diet [facility] and water; does not drink sugar sweetened beverages. Diet related problem areas are high fat choices at lunch  or when eating "out" in evening, portion control and bread intake. Pt. States, "I love bread" Physical activity: none  Dietary Intake:  Usual eating pattern includes 2-3 meals and 1-2 snacks per day. Dining out frequency: 5 meals per week.  Breakfast: 2 boiled eggs or Premier protein shake Lunch: 11:30amchicken sandwich (grilled or fried), fries, pickles, diet M.Dew Snack: Has switched to celery and peanut butter vs. candy Supper: 7:30pm- chicken casserole, mac.'n cheese, gr. Beans or pork chop, baked potato, slaw or salad Snack: peanuts Beverages: Diet M.Dew, water,   Nutrition Care Education:  Weight control: Instructed on a meal plan based on 1600 calories including portion control and balance of carbohydrate, protein, non-starchy vegetables and small amounts of added fats. Diabetes:  Diet instruction included carbohydrate counting and how carbohydrate, protein and fat effect blood sugars. Also, importance of exercise in decreasing  insulin resistance.  Nutritional Diagnosis:  NI-1.5 Excessive energy intake As related to high fat choices, large portions.  As evidenced by diet history.  Intervention:  Balance meals with 1-3 servings of protein, 2-3 servings of carbohydrate and non-starchy vegetables. Spread 10-11 servings of carbohydrate over 3 meals and 1-2 snacks. Limit diet mountain dew and increase water. Limit fries at lunch to once a week vs. 3 x/week. Measure out some portions especially of starchy foods.   Education Materials given:  . Food lists/ Planning A Balanced Meal . Sample meal pattern/ menus . Goals/ instructions  Learner/ who was taught:  . Patient  Level of understanding: . Partial understanding; needs review/ practice Learning barriers: . None Willingness to learn/ readiness for change: . Acceptance, ready for change Monitoring and Evaluation:  No follow-up scheduled. Patient was encouraged to call if desires further help with her diet/nutrition.

## 2015-04-18 ENCOUNTER — Ambulatory Visit
Admission: RE | Admit: 2015-04-18 | Discharge: 2015-04-18 | Disposition: A | Discharge status: Home / Self Care | Payer: 59 | Source: Ambulatory Visit | Attending: Family Medicine | Admitting: Family Medicine

## 2015-04-18 ENCOUNTER — Other Ambulatory Visit: Payer: Self-pay | Admitting: Family Medicine

## 2015-04-18 DIAGNOSIS — M79652 Pain in left thigh: Secondary | ICD-10-CM

## 2015-05-27 DIAGNOSIS — Z0000 Encounter for general adult medical examination without abnormal findings: Secondary | ICD-10-CM | POA: Diagnosis not present

## 2015-05-27 DIAGNOSIS — E119 Type 2 diabetes mellitus without complications: Secondary | ICD-10-CM | POA: Diagnosis not present

## 2015-06-02 DIAGNOSIS — E119 Type 2 diabetes mellitus without complications: Secondary | ICD-10-CM | POA: Diagnosis not present

## 2015-07-18 ENCOUNTER — Encounter: Payer: Self-pay | Admitting: Occupational Therapy

## 2015-07-18 ENCOUNTER — Ambulatory Visit: Payer: 59 | Attending: Internal Medicine | Admitting: Occupational Therapy

## 2015-07-18 DIAGNOSIS — I890 Lymphedema, not elsewhere classified: Secondary | ICD-10-CM | POA: Insufficient documentation

## 2015-07-18 NOTE — Patient Instructions (Signed)
Educated on importance of wearing compression garments during high risk activities - not afterwards  Educations of what lymphedema is again

## 2015-07-18 NOTE — Therapy (Signed)
[facility] [address], [place], [address] Phone: [phone]   Fax:  [phone]  Occupational Therapy Treatment  Patient Details  Name: Charlene Daniels MRN: [id number] Date of Birth: 07-29-1970 No Data Recorded  Encounter Date: 07/18/2015      OT End of Session - 07/18/15 1612    Visit Number 1   Number of Visits 5   Date for OT Re-Evaluation 08/22/15   OT Start Time 1349   OT Stop Time 1440   OT Time Calculation (min) 51 min   Activity Tolerance Patient tolerated treatment well   Behavior During Therapy [facility] for tasks assessed/performed      Past Medical History  Diagnosis Date  . PONV (postoperative nausea and vomiting)   . Diabetes mellitus without complication ([place])   . Hypertension   . Chronic kidney disease     on meds to protect kidney from DM  . GERD (gastroesophageal reflux disease)   . Headache(784.0)   . Arthritis     Past Surgical History  Procedure Laterality Date  . Tonsillectomy  1998  . Cholecystectomy  1994  . Dilation and curettage of uterus  1994  . Abdominal hysterectomy  2009  . Lymph node dissection Right 1999    right neck cervical LN dissection after Cat-scratch fever  . Shoulder arthroscopy with rotator cuff repair and subacromial decompression Left 02/12/2013    Procedure: LEFT SHOULDER ARTHROSCOPY WITH SUBACROMIAL DECOMPRESSION, DISTAL CLAVICLE RESECTION, POSSIBLE BICEP TENOTOMY, POSSIBLE ROTATOR CUFF REPAIR;  Surgeon: Marin Shutter, MD;  Location: [place];  Service: Orthopedics;  Laterality: Left;    There were no vitals filed for this visit.  Visit Diagnosis:  Lymphedema - Plan: Ot plan of care cert/re-cert      Subjective Assessment - 07/18/15 1602    Subjective  I had R cervical ln removed in 1991 and since then had trouble with lymphedema in the R side of my face, neck , chest/under arm and R UE - moved few months agoa and sicne then  flared up again    Patient Stated Goals Want to get the lymphedema under control, new compression garments so that heavy and ache feeling gets better    Currently in Pain? Yes   Pain Score 4    Pain Location Neck   Pain Orientation Right   Pain Descriptors / Indicators Aching;Tightness   Pain Radiating Towards ,under arm and face   Pain Onset More than a month ago   Pain Frequency Constant             LYMPHEDEMA/ONCOLOGY QUESTIONNAIRE - 07/18/15 1605    What other symptoms do you have   Are you Having Heaviness or Tightness Yes   Are you having Pain Yes   Do you have infections No   Lymphedema Stage   Stage STAGE 1 SPONTANEOUSLY REVERSIBLE   Right Upper Extremity Lymphedema   At Axilla  50.1 cm   15 cm Proximal to Olecranon Process 50.8 cm   10 cm Proximal to Olecranon Process 43.8 cm   Olecranon Process 31 cm   15 cm Proximal to Ulnar Styloid Process 31.4 cm   10 cm Proximal to Ulnar Styloid Process 28 cm   Just Proximal to Ulnar Styloid Process 19 cm   Across Hand at [employer] 21.6 cm   At [place] of 2nd Digit 7.5 cm   At [facility] of Thumb 7.1 cm   Left Upper Extremity  Lymphedema   At Axilla  49.8 cm   15 cm Proximal to Olecranon Process 51 cm   10 cm Proximal to Olecranon Process 43.2 cm   Olecranon Process 30.1 cm   15 cm Proximal to Ulnar Styloid Process 30.7 cm   10 cm Proximal to Ulnar Styloid Process 27 cm   Just Proximal to Ulnar Styloid Process 18.5 cm   Across Hand at [employer] 21.4 cm   At [place] of 2nd Digit 7.4 cm   At [facility] of Thumb 7 cm                         OT Education - 07/18/15 1612    Education provided Yes   Education Details lymphedema    Person(s) Educated Patient   Methods Explanation;Demonstration;Tactile cues   Comprehension Returned demonstration;Verbalized understanding          OT Short Term Goals - 07/18/15 1628    OT SHORT TERM GOAL #1   Title Pt to be ind in self MLD to do at home prior to fitting of pump   Baseline was  taught MLD last in 2011   Time 2   Period Weeks   Status New           OT Long Term Goals - 07/18/15 1626    OT LONG TERM GOAL #1   Title Pt to be ind in homeprogram to manage her lymphedema in R side of face, chest/under arm , upper back , and R UE    Baseline Pt report tight, heavy and ache feeling under arm , upper back , anterior chest, face under eye and cheek - as well as upper arm - although R UE was not increase this date    Time 5   Period Weeks   Status New               Plan - 07/18/15 1613    Clinical Impression Statement  Pt had R cervical chain of lymphnodes removed in 1991 because of "cat scratch fever" - had lymphedema in R side of face, neck , R chest , under arm and R UE - she was seen in past by lymphedema CLT's that did MLD , wore compression to keep it under control - but  last treatment was in 2011 - pt compression bra, sleeve and glove not fitting anymore - she moved  few wks agao and flareup  now - pain  and tightness/heaviness about  4/10 constant and at the wose 8/10 - she works as Therapist, 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 -  activities that worsen it  is vacuuming , lifting or moving heavy objects -  would recommend  for her  a flexi touch pump at this time because of her  history of lymphedema since the 90's - and flareups - and it is hard to get appropriate compression on face and neck -proximal R UE lymphedeam increase if chest/trunk and neck is congested -  and she has busy schedule because of her  occupation as Therapist, 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)   Pt will benefit from skilled therapeutic intervention in order to improve on the following deficits (Retired) Pain;Increased edema   Rehab Potential Good   OT Frequency 1x / week   OT Duration 6 weeks   OT Treatment/Interventions Self-care/ADL training;Patient/family education;Manual Therapy;Manual lymph drainage;Therapeutic exercises;DME and/or AE instruction   Plan Contact flexitouch pump rep- and do  MLD next session  - setup to get measured for compression garments    OT Home Exercise Plan see pt instruction    Consulted and Agree with Plan of Care Patient        Problem List There are no active problems to display for this patient.   Rosalyn Gess 07/18/2015, 4:33 PM  [facility] [address], [place], [address] Phone: [phone]   Fax:  [phone]  Name: ARCILIA AEBERSOLD MRN: [id number] Date of Birth: 1970/09/28

## 2015-07-21 ENCOUNTER — Ambulatory Visit: Payer: Self-pay | Attending: Internal Medicine | Admitting: Occupational Therapy

## 2015-07-21 ENCOUNTER — Ambulatory Visit: Payer: 59 | Admitting: Occupational Therapy

## 2015-07-21 DIAGNOSIS — I890 Lymphedema, not elsewhere classified: Secondary | ICD-10-CM | POA: Insufficient documentation

## 2015-07-28 ENCOUNTER — Ambulatory Visit: Payer: Self-pay | Admitting: Occupational Therapy

## 2015-07-28 DIAGNOSIS — I890 Lymphedema, not elsewhere classified: Secondary | ICD-10-CM

## 2015-07-28 NOTE — Therapy (Signed)
[facility] [address] [place], [address] Phone: [phone]   Fax:  [phone]  Occupational Therapy Treatment  Patient Details  Name: AMANY SIVILAY MRN: [id number] Date of Birth: Dec 13, 1970 No Data Recorded  Encounter Date: 07/28/2015      OT End of Session - 07/28/15 1841    Visit Number 2   Number of Visits 5   Date for OT Re-Evaluation 08/22/15   OT Start Time 1345   OT Stop Time 1426   OT Time Calculation (min) 41 min   Activity Tolerance Patient tolerated treatment well   Behavior During Therapy [facility] for tasks assessed/performed      Past Medical History  Diagnosis Date  . PONV (postoperative nausea and vomiting)   . Diabetes mellitus without complication ([place])   . Hypertension   . Chronic kidney disease     on meds to protect kidney from DM  . GERD (gastroesophageal reflux disease)   . Headache(784.0)   . Arthritis     Past Surgical History  Procedure Laterality Date  . Tonsillectomy  1998  . Cholecystectomy  1994  . Dilation and curettage of uterus  1994  . Abdominal hysterectomy  2009  . Lymph node dissection Right 1999    right neck cervical LN dissection after Cat-scratch fever  . Shoulder arthroscopy with rotator cuff repair and subacromial decompression Left 02/12/2013    Procedure: LEFT SHOULDER ARTHROSCOPY WITH SUBACROMIAL DECOMPRESSION, DISTAL CLAVICLE RESECTION, POSSIBLE BICEP TENOTOMY, POSSIBLE ROTATOR CUFF REPAIR;  Surgeon: Marin Shutter, MD;  Location: [place];  Service: Orthopedics;  Laterality: Left;    There were no vitals filed for this visit.  Visit Diagnosis:  Lymphedema      Subjective Assessment - 07/28/15 1830    Subjective  I felt good after trying that flexitouch pump on my head, neck and trunk - had less pain under my arm and heaviness , and not as tight feeling in my face and shoulder    Patient Stated Goals Want to get the lymphedema under control,  new compression garments so that heavy and ache feeling gets better    Currently in Pain? No/denies                      OT Treatments/Exercises (OP) - 07/28/15 0001    Manual Therapy   Manual Lymphatic Drainage (MLD) see note       Pt ed on self MLD  1.Stimulate L axillary ln , and R inguinal 8 reps 2.Pump from R to L over AAA 8 reps 3. And R AI 8 reps   4. Effleurage anterior chest to shoulder along clavicle 5. Stimulate Cervical  Ln  Behind clavicle and lateral cervical ln  8 reps each  6. Stationary circles ln front and behind ears 8 reps  7. Rework nr 5  8. Stationary circles on the submandibular ln 8 reps 9. Rework 5 10. Rework 4,3, 2 and 1   To do one time day  Pt ed on can add facial path to ears and PAA in side lying  Pt assess and ed on correct compression bra and jovi pak for trunk lymphedema             OT Education - 07/28/15 1841    Education provided Yes   Education Details MLD   [employer]) Educated Patient   Methods Explanation;Demonstration;Tactile cues;Verbal cues;Handout   Comprehension Verbal cues required;Returned demonstration;Verbalized understanding  OT Short Term Goals - 07/18/15 1628    OT SHORT TERM GOAL #1   Title Pt to be ind in self MLD to do at home prior to fitting of pump   Baseline was taught MLD last in 2011   Time 2   Period Weeks   Status New           OT Long Term Goals - 07/18/15 1626    OT LONG TERM GOAL #1   Title Pt to be ind in homeprogram to manage her lymphedema in R side of face, chest/under arm , upper back , and R UE    Baseline Pt report tight, heavy and ache feeling under arm , upper back , anterior chest, face under eye and cheek - as well as upper arm - although R UE was not increase this date    Time 5   Period Weeks   Status New               Plan - 07/28/15 1842    Clinical Impression Statement Pt report improving since REP did demo with flexi touch - pain and  swelling less under arm , less tighenss in face and neck - pt ed on self MLD while awaiting insurance approval for pump - pt info provided for new compression bra and jovi pak  - to use after or during high risk activties     Pt will benefit from skilled therapeutic intervention in order to improve on the following deficits (Retired) Pain;Increased edema   Rehab Potential Good   OT Frequency Biweekly   OT Duration 4 weeks   OT Treatment/Interventions Self-care/ADL training;Patient/family education;Manual Therapy;Manual lymph drainage;Therapeutic exercises;DME and/or AE instruction   Plan Await flexion touch approval - assess use of MLD and compression bra and jovipad   OT Home Exercise Plan see pt instruction    Consulted and Agree with Plan of Care Patient        Problem List There are no active problems to display for this patient.   Rosalyn Gess OTR/lLCLT  07/28/2015, 6:46 PM  [facility] [address], [place], [address] Phone: [phone]   Fax:  [phone]  Name: ELLAMARIE TRIANO MRN: [id number] Date of Birth: January 16, 1971

## 2015-07-28 NOTE — Patient Instructions (Addendum)
Pt ed on self MLD  1.Stimulate L axillary ln , and R inguinal 8 reps 2.Pump from R to L over AAA 8 reps 3. And R AI 8 reps   4. Effleurage anterior chest to shoulder along clavicle 5. Stimulate Cervical  Ln  Behind clavicle and lateral cervical ln  8 reps each  6. Stationary circles ln front and behind ears 8 reps  7. Rework nr 5  8. Stationary circles on the submandibular ln 8 reps 9. Rework 5 10. Rework 4,3, 2 and 1   To do one time day  Pt ed on can add facial path to ears and PAA in side lying  Pt assess and ed on correct compression bra and jovi pak for trunk lymphedema

## 2015-08-16 ENCOUNTER — Ambulatory Visit: Payer: Self-pay | Admitting: Occupational Therapy

## 2015-08-31 ENCOUNTER — Ambulatory Visit: Payer: 59 | Admitting: Occupational Therapy

## 2015-10-19 ENCOUNTER — Ambulatory Visit: Payer: Self-pay | Admitting: Physician Assistant

## 2016-02-13 ENCOUNTER — Other Ambulatory Visit: Payer: Self-pay | Admitting: Internal Medicine

## 2016-02-13 DIAGNOSIS — Z1231 Encounter for screening mammogram for malignant neoplasm of breast: Secondary | ICD-10-CM

## 2016-03-15 ENCOUNTER — Ambulatory Visit
Admission: RE | Admit: 2016-03-15 | Discharge: 2016-03-15 | Disposition: A | Discharge status: Home / Self Care | Payer: 59 | Source: Ambulatory Visit | Attending: Internal Medicine | Admitting: Internal Medicine

## 2016-03-15 DIAGNOSIS — Z1231 Encounter for screening mammogram for malignant neoplasm of breast: Secondary | ICD-10-CM | POA: Insufficient documentation

## 2016-04-04 DIAGNOSIS — E119 Type 2 diabetes mellitus without complications: Secondary | ICD-10-CM | POA: Diagnosis not present

## 2016-06-19 DIAGNOSIS — E119 Type 2 diabetes mellitus without complications: Secondary | ICD-10-CM | POA: Diagnosis not present

## 2016-06-26 DIAGNOSIS — M5136 Other intervertebral disc degeneration, lumbar region: Secondary | ICD-10-CM | POA: Diagnosis not present

## 2016-06-26 DIAGNOSIS — F1190 Opioid use, unspecified, uncomplicated: Secondary | ICD-10-CM | POA: Diagnosis not present

## 2016-06-26 DIAGNOSIS — E119 Type 2 diabetes mellitus without complications: Secondary | ICD-10-CM | POA: Diagnosis not present

## 2016-06-26 DIAGNOSIS — Z0000 Encounter for general adult medical examination without abnormal findings: Secondary | ICD-10-CM | POA: Diagnosis not present

## 2016-10-09 DIAGNOSIS — E119 Type 2 diabetes mellitus without complications: Secondary | ICD-10-CM | POA: Diagnosis not present

## 2016-10-09 DIAGNOSIS — M1990 Unspecified osteoarthritis, unspecified site: Secondary | ICD-10-CM | POA: Diagnosis not present

## 2016-10-16 DIAGNOSIS — M25551 Pain in right hip: Secondary | ICD-10-CM | POA: Diagnosis not present

## 2016-10-16 DIAGNOSIS — E119 Type 2 diabetes mellitus without complications: Secondary | ICD-10-CM | POA: Diagnosis not present

## 2016-10-16 DIAGNOSIS — F1190 Opioid use, unspecified, uncomplicated: Secondary | ICD-10-CM | POA: Diagnosis not present

## 2016-10-22 DIAGNOSIS — R0781 Pleurodynia: Secondary | ICD-10-CM | POA: Diagnosis not present

## 2016-10-22 DIAGNOSIS — M47816 Spondylosis without myelopathy or radiculopathy, lumbar region: Secondary | ICD-10-CM | POA: Diagnosis not present

## 2016-10-22 DIAGNOSIS — M7061 Trochanteric bursitis, right hip: Secondary | ICD-10-CM | POA: Diagnosis not present

## 2016-10-22 DIAGNOSIS — M25551 Pain in right hip: Secondary | ICD-10-CM | POA: Diagnosis not present

## 2016-10-24 ENCOUNTER — Other Ambulatory Visit: Payer: Self-pay | Admitting: Surgery

## 2016-10-24 DIAGNOSIS — S76011A Strain of muscle, fascia and tendon of right hip, initial encounter: Secondary | ICD-10-CM

## 2016-10-24 DIAGNOSIS — M7061 Trochanteric bursitis, right hip: Secondary | ICD-10-CM

## 2016-10-25 ENCOUNTER — Other Ambulatory Visit: Payer: Self-pay | Admitting: Surgery

## 2016-10-25 DIAGNOSIS — M7061 Trochanteric bursitis, right hip: Secondary | ICD-10-CM

## 2016-10-25 DIAGNOSIS — M25551 Pain in right hip: Secondary | ICD-10-CM

## 2016-10-25 DIAGNOSIS — S76011A Strain of muscle, fascia and tendon of right hip, initial encounter: Secondary | ICD-10-CM

## 2016-10-26 ENCOUNTER — Ambulatory Visit: Payer: 59

## 2016-10-26 ENCOUNTER — Ambulatory Visit
Admission: RE | Admit: 2016-10-26 | Discharge: 2016-10-26 | Disposition: A | Discharge status: Home / Self Care | Payer: 59 | Source: Ambulatory Visit | Attending: Surgery | Admitting: Surgery

## 2016-10-26 DIAGNOSIS — S76011A Strain of muscle, fascia and tendon of right hip, initial encounter: Secondary | ICD-10-CM

## 2016-10-26 DIAGNOSIS — M25551 Pain in right hip: Secondary | ICD-10-CM | POA: Diagnosis not present

## 2016-10-26 DIAGNOSIS — M7061 Trochanteric bursitis, right hip: Secondary | ICD-10-CM

## 2017-02-15 DIAGNOSIS — F1190 Opioid use, unspecified, uncomplicated: Secondary | ICD-10-CM | POA: Diagnosis not present

## 2017-02-15 DIAGNOSIS — E119 Type 2 diabetes mellitus without complications: Secondary | ICD-10-CM | POA: Diagnosis not present

## 2017-02-19 DIAGNOSIS — M25552 Pain in left hip: Secondary | ICD-10-CM | POA: Diagnosis not present

## 2017-02-19 DIAGNOSIS — M25551 Pain in right hip: Secondary | ICD-10-CM | POA: Diagnosis not present

## 2017-02-19 DIAGNOSIS — E119 Type 2 diabetes mellitus without complications: Secondary | ICD-10-CM | POA: Diagnosis not present

## 2017-02-27 DIAGNOSIS — M25551 Pain in right hip: Secondary | ICD-10-CM | POA: Diagnosis not present

## 2017-02-27 DIAGNOSIS — M25552 Pain in left hip: Secondary | ICD-10-CM | POA: Diagnosis not present

## 2017-03-03 ENCOUNTER — Ambulatory Visit
Admission: EM | Admit: 2017-03-03 | Discharge: 2017-03-03 | Disposition: A | Discharge status: Home / Self Care | Payer: 59 | Attending: Physician Assistant | Admitting: Physician Assistant

## 2017-03-03 DIAGNOSIS — R1032 Left lower quadrant pain: Secondary | ICD-10-CM | POA: Diagnosis not present

## 2017-03-03 DIAGNOSIS — K5732 Diverticulitis of large intestine without perforation or abscess without bleeding: Secondary | ICD-10-CM

## 2017-03-03 DIAGNOSIS — K5792 Diverticulitis of intestine, part unspecified, without perforation or abscess without bleeding: Secondary | ICD-10-CM | POA: Diagnosis not present

## 2017-03-03 MED ORDER — ONDANSETRON 8 MG PO TBDP: 8.0000 mg | ORAL_TABLET | Freq: Three times a day (TID) | ORAL | 0 refills | Status: DC | PRN

## 2017-03-03 MED ORDER — METRONIDAZOLE 500 MG PO TABS: 500.0000 mg | ORAL_TABLET | Freq: Three times a day (TID) | ORAL | 0 refills | Status: AC

## 2017-03-03 MED ORDER — CIPROFLOXACIN HCL 500 MG PO TABS: 500.0000 mg | ORAL_TABLET | Freq: Two times a day (BID) | ORAL | 0 refills | Status: DC

## 2017-03-03 NOTE — ED Triage Notes (Signed)
Patient complains of abdominal pain and nausea. Patient states that this morning she started having abdominal pain in left lower quadrant. Patient states that pain is worse when she pushes on it. Patient states that she feels flush. Patient reports previous history of diverticulitis.

## 2017-03-03 NOTE — Discharge Instructions (Signed)
-  Cipro: one tablet twice a day for 10 days -Flagyl: one tablet twice a day for 10 days -Zofran: one tablet every 8 hours as needed for pain -fluids -recommend bland diet until better -follow up with Dr. Sabra Heck

## 2017-03-03 NOTE — ED Provider Notes (Addendum)
[facility]    CSN: [id number] Arrival date & time: 03/03/17  1436     History   Chief Complaint Chief Complaint  Patient presents with  . Abdominal Pain    HPI Charlene Daniels is a 46 y.o. female.   Patient is a 46 year old female with a history diverticulosis and diverticulitis who presents with complaint of abdominal pain that started last night. Patient ports of the pain initially seemed like it is getting better this morning but then began worsening has been getting worse through the day. Patient reports overall general malaise not feeling well with nausea and cramping pain mostly on the lower left quadrant to lower left flank. Patient states that this is pain that is very similar to her previous episodes but that the earlier pains have been more lower gastric and that really towards his left side. She reports that her last flare was about 2-3 years ago and she is very careful with what she eats. Patient also reports that she did finish is not pass out and has had some diarrhea today as well. Patient denies any urinary symptoms including painful voiding, burning, frequency. Patient states that her normal flares are treated well with Cipro and Flagyl.      Past Medical History:  Diagnosis Date  . Arthritis   . Chronic kidney disease    on meds to protect kidney from DM  . Diabetes mellitus without complication ([place])   . GERD (gastroesophageal reflux disease)   . Headache(784.0)   . Hypertension   . PONV (postoperative nausea and vomiting)     There are no active problems to display for this patient.   Past Surgical History:  Procedure Laterality Date  . ABDOMINAL HYSTERECTOMY  2009  . CHOLECYSTECTOMY  1994  . DILATION AND CURETTAGE OF UTERUS  1994  . LYMPH NODE DISSECTION Right 1999   right neck cervical LN dissection after Cat-scratch fever  . SHOULDER ARTHROSCOPY WITH ROTATOR CUFF REPAIR AND SUBACROMIAL DECOMPRESSION Left 02/12/2013   Procedure: LEFT  SHOULDER ARTHROSCOPY WITH SUBACROMIAL DECOMPRESSION, DISTAL CLAVICLE RESECTION, POSSIBLE BICEP TENOTOMY, POSSIBLE ROTATOR CUFF REPAIR;  Surgeon: Marin Shutter, MD;  Location: [place];  Service: Orthopedics;  Laterality: Left;  . TONSILLECTOMY  1998    OB History    No data available       Home Medications    Prior to Admission medications   Medication Sig Start Date End Date Taking? Authorizing Provider  acetaminophen (TYLENOL) 325 MG tablet Take 650 mg by mouth daily as needed for pain. For pain   Yes [provider]  aspirin 81 MG tablet Take 81 mg by mouth daily as needed for pain. For pain/blood thinner   Yes [provider]  Cholecalciferol (VITAMIN D-3) 5000 UNITS TABS Take 1 tablet by mouth daily.   Yes [provider]  Cyanocobalamin (VITAMIN B-12 PO) Take 1 tablet by mouth daily.   Yes [provider]  estradiol (ESTRACE) 1 MG tablet Take 0.5 mg by mouth at bedtime.    Yes [provider]  lisinopril-hydrochlorothiazide (PRINZIDE,ZESTORETIC) 20-12.5 MG per tablet Take 0.5 tablets by mouth at bedtime.    Yes [provider]  metFORMIN (GLUCOPHAGE) 1000 MG tablet Take 1,000 mg by mouth 2 (two) times daily with a meal.   Yes [provider]  pantoprazole (PROTONIX) 20 MG tablet Take 20 mg by mouth daily.   Yes [provider]  Probiotic Product (PROBIOTIC PO) Take 1 tablet by mouth daily.  Yes [provider]  spironolactone (ALDACTONE) 50 MG tablet Take 50 mg by mouth daily.   Yes [provider]  tiaGABine (GABITRIL) 4 MG tablet Take 4-8 mg by mouth 2 (two) times daily. 4mg  in the am 8mg  at night   Yes [provider]  ciprofloxacin (CIPRO) 500 MG tablet Take 1 tablet (500 mg total) by mouth 2 (two) times daily. 03/03/17   Luvenia Redden, PA-C  methocarbamol (ROBAXIN) 500 MG tablet Take 1 tablet (500 mg total) by mouth 4 (four) times daily. As needed for spasm Patient not taking:  Reported on 01/18/2015 02/12/13   Benedetto Goad, PA-C  metroNIDAZOLE (FLAGYL) 500 MG tablet Take 1 tablet (500 mg total) by mouth 3 (three) times daily. 03/03/17 03/13/17  Luvenia Redden, PA-C  naproxen (NAPROSYN) 500 MG tablet Take 1 tablet (500 mg total) by mouth 2 (two) times daily with a meal. Patient not taking: Reported on 01/18/2015 02/12/13   Benedetto Goad, PA-C  omeprazole (PRILOSEC) 20 MG capsule Take 20 mg by mouth daily.    [provider]  ondansetron (ZOFRAN ODT) 8 MG disintegrating tablet Take 1 tablet (8 mg total) by mouth every 8 (eight) hours as needed for nausea or vomiting. 03/03/17   Luvenia Redden, PA-C  oxyCODONE-acetaminophen (ROXICET) 5-325 MG per tablet Take 1 tablet by mouth every 4 (four) hours as needed for pain. Patient not taking: Reported on 01/18/2015 02/12/13   Benedetto Goad, PA-C  temazepam (RESTORIL) 30 MG capsule Take 1 capsule (30 mg total) by mouth at bedtime as needed for sleep. Patient not taking: Reported on 01/18/2015 02/12/13   Prudencio Burly    Family History Family History  Problem Relation Age of Onset  . Ovarian cancer Mother 44  . Esophageal cancer Father 25  . Breast cancer Maternal Aunt 50    Social History Social History  Substance Use Topics  . Smoking status: Never Smoker  . Smokeless tobacco: Never Used  . Alcohol use Yes     Comment: rarely     Allergies   Darvocet [propoxyphene n-acetaminophen]; Ultram [tramadol]; and Sulfa antibiotics   Review of Systems Review of Systems  As noted above in history of present illness. Other systems reviewed and found be negative.    Physical Exam Triage Vital Signs ED Triage Vitals  Enc Vitals Group     BP 03/03/17 1454 (!) 108/58     Pulse Rate 03/03/17 1454 87     Resp 03/03/17 1454 18     Temp 03/03/17 1454 98.1 F (36.7 C)     Temp Source 03/03/17 1454 Oral     SpO2 03/03/17 1454 100 %     Weight 03/03/17 1450 280 lb (127 kg)     Height 03/03/17 1450  5\' 5"  (1.651 m)     Head Circumference --      Peak Flow --      Pain Score 03/03/17 1452 10     Pain Loc --      Pain Edu? --      Excl. in [place]? --    No data found.   Updated Vital Signs BP (!) 108/58 (BP Location: Left Arm)   Pulse 87   Temp 98.1 F (36.7 C) (Oral)   Resp 18   Ht 5\' 5"  (1.651 m)   Wt 280 lb (127 kg)   SpO2 100%   BMI 46.59 kg/m   Visual Acuity Right Eye Distance:  Left Eye Distance:   Bilateral Distance:    Right Eye Near:   Left Eye Near:    Bilateral Near:     Physical Exam  Constitutional: She appears well-developed and well-nourished.  Some distress due to pain associated with movement, especially when laying flat on exam table.  HENT:  Head: Normocephalic and atraumatic.  Eyes: Pupils are equal, round, and reactive to light. EOM are normal.  Neck: Normal range of motion.  Cardiovascular: Normal rate and regular rhythm.   Pulmonary/Chest: Effort normal and breath sounds normal. No respiratory distress. She has no wheezes.  Abdominal: Soft. Bowel sounds are normal. There is tenderness in the left lower quadrant. There is guarding.       UC Treatments / Results  Labs (all labs ordered are listed, but only abnormal results are displayed) Labs Reviewed - No data to display  EKG  EKG Interpretation None       Radiology No results found.  Procedures Procedures (including critical care time)  Medications Ordered in UC Medications - No data to display   Initial Impression / Assessment and Plan / UC Course  I have reviewed the triage vital signs and the nursing notes.  Pertinent labs & imaging results that were available during my care of the patient were reviewed by me and considered in my medical decision making (see chart for details).    Patient with abdominal tenderness and nausea that she says her very consistent with her previous bouts of diverticulitis. Patient complaints and exam are consistent with this as a likely  probability.  Final Clinical Impressions(s) / UC Diagnoses   Final diagnoses:  Left lower quadrant pain  Diverticulitis of colon   Given patient's history go ahead and prescribe her a 10 day course of Cipro and Flagyl. Will have her follow up with Dr. Sabra Heck her PCP.  New Prescriptions New Prescriptions   CIPROFLOXACIN (CIPRO) 500 MG TABLET    Take 1 tablet (500 mg total) by mouth 2 (two) times daily.   METRONIDAZOLE (FLAGYL) 500 MG TABLET    Take 1 tablet (500 mg total) by mouth 3 (three) times daily.   ONDANSETRON (ZOFRAN ODT) 8 MG DISINTEGRATING TABLET    Take 1 tablet (8 mg total) by mouth every 8 (eight) hours as needed for nausea or vomiting.     Controlled Substance Prescriptions [place] Controlled Substance Registry consulted? Not Applicable   Luvenia Redden, PA-C    Luvenia Redden, PA-C 03/03/17 1537    Luvenia Redden, PA-C 03/03/17 1538

## 2017-03-05 ENCOUNTER — Emergency Department
Admission: EM | Admit: 2017-03-05 | Discharge: 2017-03-05 | Disposition: A | Discharge status: Home / Self Care | Payer: 59 | Attending: Emergency Medicine | Admitting: Emergency Medicine

## 2017-03-05 ENCOUNTER — Emergency Department: Payer: 59

## 2017-03-05 DIAGNOSIS — Z7982 Long term (current) use of aspirin: Secondary | ICD-10-CM | POA: Diagnosis not present

## 2017-03-05 DIAGNOSIS — R1032 Left lower quadrant pain: Secondary | ICD-10-CM | POA: Diagnosis not present

## 2017-03-05 DIAGNOSIS — Z7984 Long term (current) use of oral hypoglycemic drugs: Secondary | ICD-10-CM | POA: Diagnosis not present

## 2017-03-05 DIAGNOSIS — I129 Hypertensive chronic kidney disease with stage 1 through stage 4 chronic kidney disease, or unspecified chronic kidney disease: Secondary | ICD-10-CM | POA: Diagnosis not present

## 2017-03-05 DIAGNOSIS — R109 Unspecified abdominal pain: Secondary | ICD-10-CM | POA: Diagnosis not present

## 2017-03-05 DIAGNOSIS — K5732 Diverticulitis of large intestine without perforation or abscess without bleeding: Secondary | ICD-10-CM | POA: Insufficient documentation

## 2017-03-05 DIAGNOSIS — Z79899 Other long term (current) drug therapy: Secondary | ICD-10-CM | POA: Diagnosis not present

## 2017-03-05 DIAGNOSIS — E1122 Type 2 diabetes mellitus with diabetic chronic kidney disease: Secondary | ICD-10-CM | POA: Insufficient documentation

## 2017-03-05 DIAGNOSIS — N189 Chronic kidney disease, unspecified: Secondary | ICD-10-CM | POA: Diagnosis not present

## 2017-03-05 LAB — COMPREHENSIVE METABOLIC PANEL
ALK PHOS: 32 U/L — AB (ref 38–126)
ALT: 22 U/L (ref 14–54)
AST: 31 U/L (ref 15–41)
Albumin: 3.8 g/dL (ref 3.5–5.0)
Anion gap: 13 (ref 5–15)
BUN: 14 mg/dL (ref 6–20)
CALCIUM: 8.6 mg/dL — AB (ref 8.9–10.3)
CHLORIDE: 100 mmol/L — AB (ref 101–111)
CO2: 21 mmol/L — AB (ref 22–32)
Creatinine, Ser: 0.84 mg/dL (ref 0.44–1.00)
GFR calc Af Amer: >60 mL/min (ref >60)
Glucose, Bld: 364 mg/dL — ABNORMAL HIGH (ref 65–99)
Potassium: 4 mmol/L (ref 3.5–5.1)
Sodium: 134 mmol/L — ABNORMAL LOW (ref 135–145)
Total Bilirubin: 0.4 mg/dL (ref 0.3–1.2)
Total Protein: 7.4 g/dL (ref 6.5–8.1)

## 2017-03-05 LAB — URINALYSIS, COMPLETE (UACMP) WITH MICROSCOPIC
Bilirubin Urine: NEGATIVE
Hgb urine dipstick: NEGATIVE
KETONES UR: 5 mg/dL — AB
NITRITE: NEGATIVE
PROTEIN: NEGATIVE mg/dL
Specific Gravity, Urine: 1.026 (ref 1.005–1.030)
pH: 5 (ref 5.0–8.0)

## 2017-03-05 LAB — CBC WITH DIFFERENTIAL/PLATELET
Basophils Absolute: 0 10*3/uL (ref 0–0.1)
Basophils Relative: 0 %
EOS ABS: 0.2 10*3/uL (ref 0–0.7)
EOS PCT: 2 %
HEMATOCRIT: 39.8 % (ref 35.0–47.0)
HEMOGLOBIN: 13.4 g/dL (ref 12.0–16.0)
LYMPHS ABS: 1.8 10*3/uL (ref 1.0–3.6)
Lymphocytes Relative: 16 %
MCH: 29.9 pg (ref 26.0–34.0)
MCHC: 33.6 g/dL (ref 32.0–36.0)
MCV: 89 fL (ref 80.0–100.0)
MONOS PCT: 6 %
Monocytes Absolute: 0.7 10*3/uL (ref 0.2–0.9)
Neutro Abs: 8.5 10*3/uL — ABNORMAL HIGH (ref 1.4–6.5)
Neutrophils Relative %: 76 %
Platelets: 295 10*3/uL (ref 150–440)
RBC: 4.47 MIL/uL (ref 3.80–5.20)
RDW: 13.1 % (ref 11.5–14.5)
WBC: 11.2 10*3/uL — AB (ref 3.6–11.0)

## 2017-03-05 LAB — PREGNANCY, URINE: Preg Test, Ur: NEGATIVE

## 2017-03-05 MED ORDER — ONDANSETRON 4 MG PO TBDP
8.0000 mg | ORAL_TABLET | Freq: Once | ORAL | Status: AC
  Administered 2017-03-05: 8 mg via ORAL
  Filled 2017-03-05 (×2): qty 2

## 2017-03-05 MED ORDER — IOPAMIDOL (ISOVUE-300) INJECTION 61%
30.0000 mL | Freq: Once | INTRAVENOUS | Status: AC | PRN
  Administered 2017-03-05: 30 mL via ORAL

## 2017-03-05 MED ORDER — METRONIDAZOLE 500 MG PO TABS: 500.0000 mg | ORAL_TABLET | Freq: Three times a day (TID) | ORAL | 0 refills | Status: DC

## 2017-03-05 MED ORDER — IOPAMIDOL (ISOVUE-300) INJECTION 61%
100.0000 mL | Freq: Once | INTRAVENOUS | Status: AC | PRN
  Administered 2017-03-05: 100 mL via INTRAVENOUS

## 2017-03-05 MED ORDER — HYDROCODONE-ACETAMINOPHEN 5-325 MG PO TABS
1.0000 | ORAL_TABLET | Freq: Once | ORAL | Status: AC
  Administered 2017-03-05: 1 via ORAL
  Filled 2017-03-05: qty 1

## 2017-03-05 NOTE — ED Notes (Signed)
4 oz diet ginger ale given to patient.

## 2017-03-05 NOTE — ED Triage Notes (Signed)
Patient states lower and upper left abdominal pain, 10/10 started 3 days ago, went to clinic, has taken anti-biotics with no improvement. Patinjet stable and in no distress other than pain.

## 2017-03-05 NOTE — ED Notes (Signed)
Patient transported to CT 

## 2017-03-05 NOTE — ED Provider Notes (Signed)
[facility] Emergency Department Provider Note  ____________________________________________  Time seen: Approximately 10:26 AM  I have reviewed the triage vital signs and the nursing notes.   HISTORY  Chief Complaint Abdominal Pain (Nausea left upper and lower quad)    HPI Charlene Daniels is a 46 y.o. female who complains of left lower quadrant abdominal pain, gradually worsening over the past 3 days, 10/10 in severity, nonradiating, no aggravating or alleviating factors. Feels like diverticulitis that she has had in the past. She's been on Cipro and Flagyl for 3 days without improvement and in fact with worsening. Denies fevers chills or sweats. Has nausea but no vomiting or diarrhea. She is passing gas and having bowel movements. No history of bowel obstructions. She is status post TAH/BSO and cholecystectomy.     Past Medical History:  Diagnosis Date  . Arthritis   . Chronic kidney disease    on meds to protect kidney from DM  . Diabetes mellitus without complication ([place])   . GERD (gastroesophageal reflux disease)   . Headache(784.0)   . Hypertension   . PONV (postoperative nausea and vomiting)      There are no active problems to display for this patient.    Past Surgical History:  Procedure Laterality Date  . ABDOMINAL HYSTERECTOMY  2009  . CHOLECYSTECTOMY  1994  . DILATION AND CURETTAGE OF UTERUS  1994  . LYMPH NODE DISSECTION Right 1999   right neck cervical LN dissection after Cat-scratch fever  . SHOULDER ARTHROSCOPY WITH ROTATOR CUFF REPAIR AND SUBACROMIAL DECOMPRESSION Left 02/12/2013   Procedure: LEFT SHOULDER ARTHROSCOPY WITH SUBACROMIAL DECOMPRESSION, DISTAL CLAVICLE RESECTION, POSSIBLE BICEP TENOTOMY, POSSIBLE ROTATOR CUFF REPAIR;  Surgeon: Marin Shutter, MD;  Location: [facility];  Service: Orthopedics;  Laterality: Left;  . TONSILLECTOMY  1998     Prior to Admission medications   Medication Sig Start Date End Date Taking?  Authorizing Provider  aspirin 325 MG EC tablet Take 325 mg by mouth daily. For pain/blood thinner    Yes [provider]  Cholecalciferol (VITAMIN D-3) 5000 UNITS TABS Take 1 tablet by mouth daily.   Yes [provider]  ciprofloxacin (CIPRO) 500 MG tablet Take 1 tablet (500 mg total) by mouth 2 (two) times daily. 03/03/17  Yes Luvenia Redden, PA-C  Cyanocobalamin (VITAMIN B-12 PO) Take 1 tablet by mouth daily.   Yes [provider]  estradiol (ESTRACE) 1 MG tablet Take 1 mg by mouth at bedtime.    Yes [provider]  glimepiride (AMARYL) 2 MG tablet Take 2 mg by mouth daily. 02/06/17  Yes [provider]  liraglutide (VICTOZA) 18 MG/3ML SOPN Inject into the skin daily.  03/28/16 03/28/17 Yes [provider]  lisinopril-hydrochlorothiazide (PRINZIDE,ZESTORETIC) 20-12.5 MG per tablet Take 0.5 tablets by mouth at bedtime.    Yes [provider]  metFORMIN (GLUCOPHAGE) 1000 MG tablet Take 1,000 mg by mouth 2 (two) times daily with a meal.   Yes [provider]  metroNIDAZOLE (FLAGYL) 500 MG tablet Take 1 tablet (500 mg total) by mouth 3 (three) times daily. 03/03/17 03/13/17 Yes Luvenia Redden, PA-C  pantoprazole (PROTONIX) 40 MG tablet Take 20 mg by mouth 2 (two) times daily.    Yes [provider]  Probiotic Product (PROBIOTIC PO) Take 1 tablet by mouth daily.   Yes [provider]  spironolactone (ALDACTONE) 50 MG tablet Take 50 mg by mouth daily.   Yes [provider]  tiaGABine (GABITRIL) 4  MG tablet Take 8 mg by mouth at bedtime. 4mg  in the am 8mg  at night    Yes [provider]  acetaminophen (TYLENOL) 325 MG tablet Take 650 mg by mouth daily as needed for pain. For pain    [provider]  HYDROcodone-acetaminophen (NORCO/VICODIN) 5-325 MG tablet 1-2 tablets every 4 (four) hours as needed.  01/16/17   [provider]  methocarbamol (ROBAXIN) 500 MG tablet Take 1  tablet (500 mg total) by mouth 4 (four) times daily. As needed for spasm Patient not taking: Reported on 01/18/2015 02/12/13   Benedetto Goad, PA-C  metroNIDAZOLE (FLAGYL) 500 MG tablet Take 1 tablet (500 mg total) by mouth 3 (three) times daily. 03/05/17   Carrie Mew, MD  naproxen (NAPROSYN) 500 MG tablet Take 1 tablet (500 mg total) by mouth 2 (two) times daily with a meal. Patient not taking: Reported on 01/18/2015 02/12/13   Benedetto Goad, PA-C  ondansetron (ZOFRAN ODT) 8 MG disintegrating tablet Take 1 tablet (8 mg total) by mouth every 8 (eight) hours as needed for nausea or vomiting. Patient not taking: Reported on 03/05/2017 03/03/17   Luvenia Redden, PA-C  oxyCODONE-acetaminophen (ROXICET) 5-325 MG per tablet Take 1 tablet by mouth every 4 (four) hours as needed for pain. Patient not taking: Reported on 01/18/2015 02/12/13   Benedetto Goad, PA-C  temazepam (RESTORIL) 30 MG capsule Take 1 capsule (30 mg total) by mouth at bedtime as needed for sleep. Patient not taking: Reported on 01/18/2015 02/12/13   Benedetto Goad, PA-C     Allergies Darvocet [propoxyphene n-acetaminophen]; Ultram [tramadol]; and Sulfa antibiotics   Family History  Problem Relation Age of Onset  . Ovarian cancer Mother 24  . Esophageal cancer Father 27  . Breast cancer Maternal Aunt 50    Social History Social History  Substance Use Topics  . Smoking status: Never Smoker  . Smokeless tobacco: Never Used  . Alcohol use No     Comment: rarely    Review of Systems  Constitutional:   No fever or chills.  ENT:   No sore throat. No rhinorrhea. Cardiovascular:   No chest pain or syncope. Respiratory:   No dyspnea or cough. Gastrointestinal:  positive abdominal pain as above without vomiting and diarrhea.  Musculoskeletal:   Negative for focal pain or swelling All other systems reviewed and are negative except as documented above in ROS and  HPI.  ____________________________________________   PHYSICAL EXAM:  VITAL SIGNS: ED Triage Vitals  Enc Vitals Group     BP 03/05/17 0921 (!) 150/47     Pulse Rate 03/05/17 0919 87     Resp 03/05/17 0919 18     Temp 03/05/17 0919 98.6 F (37 C)     Temp Source 03/05/17 0919 Oral     SpO2 03/05/17 0919 99 %     Weight 03/05/17 0921 280 lb (127 kg)     Height 03/05/17 0921 5\' 5"  (1.651 m)     Head Circumference --      Peak Flow --      Pain Score 03/05/17 0919 10     Pain Loc --      Pain Edu? --      Excl. in [place]? --     Vital signs reviewed, nursing assessments reviewed.   Constitutional:   Alert and oriented. Well appearing and in no distress. Eyes:   No scleral icterus.  EOMI. No nystagmus. No conjunctival pallor. PERRL. ENT  Head:   Normocephalic and atraumatic.   Nose:   No congestion/rhinnorhea.    Mouth/Throat:   MMM, no pharyngeal erythema. No peritonsillar mass.    Neck:   No meningismus. Full ROM. Hematological/Lymphatic/Immunilogical:   No cervical lymphadenopathy. Cardiovascular:   RRR. Symmetric bilateral radial and DP pulses.  No murmurs.  Respiratory:   Normal respiratory effort without tachypnea/retractions. Breath sounds are clear and equal bilaterally. No wheezes/rales/rhonchi. Gastrointestinal:   Soft with focal left lower quadrant tenderness on the far lateral left.. Non distended. There is no CVA tenderness.  No rebound, rigidity, or guarding. Genitourinary:   deferred Musculoskeletal:   Normal range of motion in all extremities. No joint effusions.  No lower extremity tenderness.  No edema. Neurologic:   Normal speech and language.  Motor grossly intact. No gross focal neurologic deficits are appreciated.  Skin:    Skin is warm, dry and intact. No rash noted.  No petechiae, purpura, or bullae.  ____________________________________________    LABS (pertinent positives/negatives) (all labs ordered are listed, but only abnormal  results are displayed) Labs Reviewed  COMPREHENSIVE METABOLIC PANEL - Abnormal; Notable for the following:       Result Value   Sodium 134 (*)    Chloride 100 (*)    CO2 21 (*)    Glucose, Bld 364 (*)    Calcium 8.6 (*)    Alkaline Phosphatase 32 (*)    All other components within normal limits  CBC WITH DIFFERENTIAL/PLATELET - Abnormal; Notable for the following:    WBC 11.2 (*)    Neutro Abs 8.5 (*)    All other components within normal limits  URINALYSIS, COMPLETE (UACMP) WITH MICROSCOPIC - Abnormal; Notable for the following:    Color, Urine YELLOW (*)    APPearance HAZY (*)    Glucose, UA >=500 (*)    Ketones, ur 5 (*)    Leukocytes, UA TRACE (*)    Bacteria, UA RARE (*)    Squamous Epithelial / LPF 6-30 (*)    All other components within normal limits  URINE CULTURE  PREGNANCY, URINE   ____________________________________________   EKG    ____________________________________________    RADIOLOGY  Ct Abdomen Pelvis W Contrast  Result Date: 03/05/2017 CLINICAL DATA:  Left abdominal pain.  History of diverticulitis. EXAM: CT ABDOMEN AND PELVIS WITH CONTRAST TECHNIQUE: Multidetector CT imaging of the abdomen and pelvis was performed using the standard protocol following bolus administration of intravenous contrast. CONTRAST:  119mL ISOVUE-300 IOPAMIDOL (ISOVUE-300) INJECTION 61% COMPARISON:  11/13/2010 FINDINGS: Lower chest: Lung bases are clear. No effusions. Heart is normal size. Hepatobiliary: Fatty infiltration of the liver. No focal abnormality or biliary ductal dilatation. Prior cholecystectomy. Pancreas: No focal abnormality or ductal dilatation. Spleen: No focal abnormality.  Normal size. Adrenals/Urinary Tract: No adrenal abnormality. No focal renal abnormality. No stones or hydronephrosis. Urinary bladder is unremarkable. Stomach/Bowel: Descending colonic and sigmoid diverticulosis. Inflammatory stranding noted around the distal descending colon compatible with  active diverticulitis. No focal fluid collection. Appendix is normal. Stomach and small bowel unremarkable. Vascular/Lymphatic: No evidence of aneurysm or adenopathy. Shotty retroperitoneal lymph nodes are stable since prior study. Reproductive: Prior hysterectomy.  No adnexal masses. Other: No free fluid or free air. Musculoskeletal: No acute bony abnormality. IMPRESSION: Left colonic diverticulosis. Inflammatory stranding around the distal descending colon compatible with active diverticulitis. No complicating features. Fatty infiltration of the liver. Shotty retroperitoneal lymph nodes, stable. Prior hysterectomy and cholecystectomy. Electronically Signed   By: Rolm Baptise M.D.  On: 03/05/2017 11:45    ____________________________________________   PROCEDURES Procedures  ____________________________________________   DIFFERENTIAL DIAGNOSIS  bowel obstruction, diverticulitis with possible perforation or abscess formation,urinary tract infection  CLINICAL IMPRESSION / ASSESSMENT AND PLAN / ED COURSE  Pertinent labs & imaging results that were available during my care of the patient were reviewed by me and considered in my medical decision making (see chart for details).   patient presents with focal left lower quadrant abdominal pain and tenderness, clinically consistent with diverticulitis. She has been treated with oral antibiotics for the past 3 days without improvement. Given the focality of her abdominal exam and unremarkable vital signs, I will start with labs and symptom control. If labs are normal, I think she is stable for continued oral antibiotics and outpatient follow-up. However, if there are concerning features of the lab analysis, CT of the abdomen and pelvis would be needed.  Clinical Course as of Mar 06 1327  Tue Mar 05, 2017  1022 Slightly decreased co2 on chemistry panel. Markedly elevated glucose, possibly as acute phase reactant vs poorly controlled DM. Will proceed  with CT to eval for complicated diveritculitis  [PS]    Clinical Course User Index [PS] Carrie Mew, MD     ----------------------------------------- 1:29 PM on 03/05/2017 -----------------------------------------  CT reviewed, reveals focal diverticulitis in the descending colon, very limited in extension without any complicating features such as perforation or abscess. Patient reevaluated, vitals are unremarkable, she is calm comfortable with essentially benign abdomen. Continue Cipro and Flagyl, she has Vicodin and Zofran at home that she can take as well. I reviewed her antibiotic prescriptions and although it shows that the Flagyl was prescribed 3 times a day, she reports that the bottle says to times a day and has only been taking it twice daily. I advised her she should be taking this 3 times a day and I provided a new prescription in case it is needed.  ____________________________________________   FINAL CLINICAL IMPRESSION(S) / ED DIAGNOSES    Final diagnoses:  Left lower quadrant pain  Diverticulitis large intestine w/o perforation or abscess w/o bleeding      New Prescriptions   METRONIDAZOLE (FLAGYL) 500 MG TABLET    Take 1 tablet (500 mg total) by mouth 3 (three) times daily.     Portions of this note were generated with dragon dictation software. Dictation errors may occur despite best attempts at proofreading.    Carrie Mew, MD 03/05/17 1330

## 2017-03-05 NOTE — ED Notes (Signed)
Attempted to start IV x2 - unsuccessful. 

## 2017-03-05 NOTE — ED Notes (Signed)
C/O increased abdominal pain with PO challenge.  Dr. Joni Fears alerted.

## 2017-03-05 NOTE — ED Notes (Signed)
AAOx3.  Skin warm and dry.  NAD 

## 2017-03-05 NOTE — ED Notes (Signed)
Second contrast completed, CT called

## 2017-03-06 LAB — URINE CULTURE: CULTURE: NO GROWTH

## 2017-03-19 ENCOUNTER — Other Ambulatory Visit: Payer: Self-pay | Admitting: Internal Medicine

## 2017-03-19 DIAGNOSIS — Z1231 Encounter for screening mammogram for malignant neoplasm of breast: Secondary | ICD-10-CM

## 2017-03-20 ENCOUNTER — Ambulatory Visit
Admission: RE | Admit: 2017-03-20 | Discharge: 2017-03-20 | Disposition: A | Discharge status: Home / Self Care | Payer: 59 | Source: Ambulatory Visit | Attending: Internal Medicine | Admitting: Internal Medicine

## 2017-03-20 DIAGNOSIS — Z1231 Encounter for screening mammogram for malignant neoplasm of breast: Secondary | ICD-10-CM | POA: Diagnosis not present

## 2017-06-18 DIAGNOSIS — M7541 Impingement syndrome of right shoulder: Secondary | ICD-10-CM | POA: Diagnosis not present

## 2017-06-18 DIAGNOSIS — G8929 Other chronic pain: Secondary | ICD-10-CM | POA: Diagnosis not present

## 2017-06-18 DIAGNOSIS — M25511 Pain in right shoulder: Secondary | ICD-10-CM | POA: Diagnosis not present

## 2017-06-26 DIAGNOSIS — L814 Other melanin hyperpigmentation: Secondary | ICD-10-CM | POA: Diagnosis not present

## 2017-06-26 DIAGNOSIS — H5213 Myopia, bilateral: Secondary | ICD-10-CM | POA: Diagnosis not present

## 2017-06-26 DIAGNOSIS — D229 Melanocytic nevi, unspecified: Secondary | ICD-10-CM | POA: Diagnosis not present

## 2017-06-26 DIAGNOSIS — L820 Inflamed seborrheic keratosis: Secondary | ICD-10-CM | POA: Diagnosis not present

## 2017-06-27 DIAGNOSIS — M25511 Pain in right shoulder: Secondary | ICD-10-CM | POA: Diagnosis not present

## 2017-07-02 ENCOUNTER — Other Ambulatory Visit: Payer: Self-pay | Admitting: Orthopedic Surgery

## 2017-07-02 DIAGNOSIS — M25511 Pain in right shoulder: Secondary | ICD-10-CM

## 2017-07-16 ENCOUNTER — Ambulatory Visit
Admission: RE | Admit: 2017-07-16 | Discharge: 2017-07-16 | Disposition: A | Discharge status: Home / Self Care | Payer: 59 | Source: Ambulatory Visit | Attending: Orthopedic Surgery | Admitting: Orthopedic Surgery

## 2017-07-16 DIAGNOSIS — M25511 Pain in right shoulder: Secondary | ICD-10-CM | POA: Insufficient documentation

## 2017-07-16 DIAGNOSIS — M75111 Incomplete rotator cuff tear or rupture of right shoulder, not specified as traumatic: Secondary | ICD-10-CM | POA: Diagnosis not present

## 2017-07-16 DIAGNOSIS — S46811A Strain of other muscles, fascia and tendons at shoulder and upper arm level, right arm, initial encounter: Secondary | ICD-10-CM | POA: Insufficient documentation

## 2017-07-16 DIAGNOSIS — M12811 Other specific arthropathies, not elsewhere classified, right shoulder: Secondary | ICD-10-CM | POA: Insufficient documentation

## 2017-07-16 MED ORDER — GADOBENATE DIMEGLUMINE 529 MG/ML IV SOLN
0.1000 mL | Freq: Once | INTRAVENOUS | Status: AC | PRN
  Administered 2017-07-16: 0.1 mL via INTRA_ARTICULAR

## 2017-07-16 MED ORDER — LIDOCAINE HCL (PF) 1 % IJ SOLN
10.0000 mL | Freq: Once | INTRAMUSCULAR | Status: AC
  Administered 2017-07-16: 10 mL
  Filled 2017-07-16: qty 10

## 2017-07-16 MED ORDER — GADOBENATE DIMEGLUMINE 529 MG/ML IV SOLN
1.0000 mL | Freq: Once | INTRAVENOUS | Status: AC | PRN
  Administered 2017-07-16: 1 mL via INTRAVENOUS

## 2017-07-16 MED ORDER — IOPAMIDOL (ISOVUE-200) INJECTION 41%
15.0000 mL | Freq: Once | INTRAVENOUS | Status: AC | PRN
  Administered 2017-07-16: 15 mL
  Filled 2017-07-16: qty 50

## 2017-07-22 DIAGNOSIS — M75111 Incomplete rotator cuff tear or rupture of right shoulder, not specified as traumatic: Secondary | ICD-10-CM | POA: Diagnosis not present

## 2017-07-31 DIAGNOSIS — M25511 Pain in right shoulder: Secondary | ICD-10-CM | POA: Diagnosis not present

## 2017-08-06 ENCOUNTER — Ambulatory Visit: Payer: 59

## 2017-08-07 DIAGNOSIS — M25511 Pain in right shoulder: Secondary | ICD-10-CM | POA: Diagnosis not present

## 2017-08-09 DIAGNOSIS — M25511 Pain in right shoulder: Secondary | ICD-10-CM | POA: Diagnosis not present

## 2017-08-14 DIAGNOSIS — M25511 Pain in right shoulder: Secondary | ICD-10-CM | POA: Diagnosis not present

## 2017-08-16 DIAGNOSIS — M25511 Pain in right shoulder: Secondary | ICD-10-CM | POA: Diagnosis not present

## 2017-08-21 DIAGNOSIS — M25511 Pain in right shoulder: Secondary | ICD-10-CM | POA: Diagnosis not present

## 2017-08-28 DIAGNOSIS — M25511 Pain in right shoulder: Secondary | ICD-10-CM | POA: Diagnosis not present

## 2017-08-29 DIAGNOSIS — M75111 Incomplete rotator cuff tear or rupture of right shoulder, not specified as traumatic: Secondary | ICD-10-CM | POA: Diagnosis not present

## 2017-09-04 DIAGNOSIS — M25511 Pain in right shoulder: Secondary | ICD-10-CM | POA: Diagnosis not present

## 2017-09-05 ENCOUNTER — Other Ambulatory Visit: Payer: Self-pay | Admitting: Orthopedic Surgery

## 2017-09-13 ENCOUNTER — Other Ambulatory Visit: Payer: Self-pay

## 2017-09-13 ENCOUNTER — Encounter
Admission: RE | Admit: 2017-09-13 | Discharge: 2017-09-13 | Disposition: A | Discharge status: Home / Self Care | Payer: 59 | Source: Ambulatory Visit | Attending: Orthopedic Surgery | Admitting: Orthopedic Surgery

## 2017-09-13 DIAGNOSIS — Z01810 Encounter for preprocedural cardiovascular examination: Secondary | ICD-10-CM | POA: Insufficient documentation

## 2017-09-13 DIAGNOSIS — Z01812 Encounter for preprocedural laboratory examination: Secondary | ICD-10-CM | POA: Insufficient documentation

## 2017-09-13 DIAGNOSIS — E119 Type 2 diabetes mellitus without complications: Secondary | ICD-10-CM | POA: Diagnosis not present

## 2017-09-13 DIAGNOSIS — M5090 Cervical disc disorder, unspecified, unspecified cervical region: Secondary | ICD-10-CM | POA: Diagnosis not present

## 2017-09-13 LAB — PROTIME-INR
INR: 1.09
Prothrombin Time: 14 seconds (ref 11.4–15.2)

## 2017-09-13 LAB — APTT: aPTT: 28 seconds (ref 24–36)

## 2017-09-13 NOTE — Patient Instructions (Signed)
Your procedure is scheduled on: Sep 19, 2017 THURSDAY Report to Day Surgery on the 2nd floor of the [facility]. To find out your arrival time, please call [phone] between 1PM - 3PM on: Sep 18, 2017 [facility]  REMEMBER: Instructions that are not followed completely may result in serious medical risk, up to and including death; or upon the discretion of your surgeon and anesthesiologist your surgery may need to be rescheduled.  Do not eat food after midnight the night before your procedure.  No gum chewing, lozengers or hard candies.  You may however, drink CLEAR liquids up to 2 hours before you are scheduled to arrive for your surgery. Do not drink anything within 2 hours of the start of your surgery.  Clear liquids include: - water   Do NOT drink anything that is not on this list.  Type 1 and Type 2 diabetics should only drink water.  No Alcohol for 24 hours before or after surgery.  No Smoking including e-cigarettes for 24 hours prior to surgery.  No chewable tobacco products for at least 6 hours prior to surgery.  No nicotine patches on the day of surgery.  On the morning of surgery brush your teeth with toothpaste and water, you may rinse your mouth with mouthwash if you wish. Do not swallow any toothpaste or mouthwash.  Notify your doctor if there is any change in your medical condition (cold, fever, infection).  Do not wear jewelry, make-up, hairpins, clips or nail polish.  Do not wear lotions, powders, or perfumes. You may NOT wear deodorant.  Do not shave 48 hours prior to surgery. Men may shave face and neck.  Contacts and dentures may not be worn into surgery.  Do not bring valuables to the hospital, including drivers license, insurance or credit cards.  [facility] is not responsible for any belongings or valuables.   TAKE THESE MEDICATIONS THE MORNING OF SURGERY: PANTOPRAZOLE   Use CHG Soap as directed on instruction sheet.   Use inhalers on the day of  surgery and bring to the hospital.  Stop Metformin  2 days prior to surgery.  Follow recommendations from Cardiologist, Pulmonologist or PCP regarding stopping Aspirin, Coumadin, Plavix, Eliquis, Pradaxa, or Pletal.  Stop Anti-inflammatories (NSAIDS) such as MELOXICAM  Advil, Aleve, Ibuprofen, Motrin, Naproxen, Naprosyn and Aspirin based products such as Excedrin, Goodys Powder, BC Powder. (May take Tylenol or Acetaminophen if needed.)  Stop ANY OVER THE COUNTER supplements until after surgery VIT C , GARLIC, KRILL OIL  (May continue Vitamin D, Vitamin B, and multivitamin.)  Wear comfortable clothing (specific to your surgery type) to the hospital.  Plan for stool softeners for home use.   If you are being discharged the day of surgery, you will not be allowed to drive home. You will need a responsible adult to drive you home and stay with you that night.   If you are taking public transportation, you will need to have a responsible adult with you. Please confirm with your physician that it is acceptable to use public transportation.   Please call [phone] if you have any questions about these instructions.

## 2017-09-19 ENCOUNTER — Encounter: Payer: Self-pay | Admitting: *Deleted

## 2017-09-19 ENCOUNTER — Ambulatory Visit
Admission: RE | Admit: 2017-09-19 | Discharge: 2017-09-19 | Disposition: A | Discharge status: Home / Self Care | Payer: 59 | Source: Ambulatory Visit | Attending: Orthopedic Surgery | Admitting: Orthopedic Surgery

## 2017-09-19 ENCOUNTER — Encounter
Admission: RE | Disposition: A | Discharge status: Home / Self Care | Payer: Self-pay | Source: Ambulatory Visit | Attending: Orthopedic Surgery

## 2017-09-19 ENCOUNTER — Ambulatory Visit: Payer: 59 | Admitting: Anesthesiology

## 2017-09-19 ENCOUNTER — Other Ambulatory Visit: Payer: Self-pay

## 2017-09-19 DIAGNOSIS — M75111 Incomplete rotator cuff tear or rupture of right shoulder, not specified as traumatic: Secondary | ICD-10-CM | POA: Insufficient documentation

## 2017-09-19 DIAGNOSIS — Z79899 Other long term (current) drug therapy: Secondary | ICD-10-CM | POA: Insufficient documentation

## 2017-09-19 DIAGNOSIS — N189 Chronic kidney disease, unspecified: Secondary | ICD-10-CM | POA: Insufficient documentation

## 2017-09-19 DIAGNOSIS — Z8041 Family history of malignant neoplasm of ovary: Secondary | ICD-10-CM | POA: Diagnosis not present

## 2017-09-19 DIAGNOSIS — M1990 Unspecified osteoarthritis, unspecified site: Secondary | ICD-10-CM | POA: Insufficient documentation

## 2017-09-19 DIAGNOSIS — Z888 Allergy status to other drugs, medicaments and biological substances status: Secondary | ICD-10-CM | POA: Insufficient documentation

## 2017-09-19 DIAGNOSIS — Z803 Family history of malignant neoplasm of breast: Secondary | ICD-10-CM | POA: Diagnosis not present

## 2017-09-19 DIAGNOSIS — Z7982 Long term (current) use of aspirin: Secondary | ICD-10-CM | POA: Diagnosis not present

## 2017-09-19 DIAGNOSIS — Z800 Family history of malignant neoplasm of digestive organs: Secondary | ICD-10-CM | POA: Diagnosis not present

## 2017-09-19 DIAGNOSIS — Z7984 Long term (current) use of oral hypoglycemic drugs: Secondary | ICD-10-CM | POA: Insufficient documentation

## 2017-09-19 DIAGNOSIS — K219 Gastro-esophageal reflux disease without esophagitis: Secondary | ICD-10-CM | POA: Diagnosis not present

## 2017-09-19 DIAGNOSIS — E1122 Type 2 diabetes mellitus with diabetic chronic kidney disease: Secondary | ICD-10-CM | POA: Diagnosis not present

## 2017-09-19 DIAGNOSIS — Z882 Allergy status to sulfonamides status: Secondary | ICD-10-CM | POA: Diagnosis not present

## 2017-09-19 HISTORY — PX: SHOULDER ARTHROSCOPY WITH OPEN ROTATOR CUFF REPAIR: SHX6092

## 2017-09-19 LAB — GLUCOSE, CAPILLARY
Glucose-Capillary: 217 mg/dL — ABNORMAL HIGH (ref 65–99)
Glucose-Capillary: 256 mg/dL — ABNORMAL HIGH (ref 65–99)

## 2017-09-19 SURGERY — ARTHROSCOPY, SHOULDER WITH REPAIR, ROTATOR CUFF, OPEN
Anesthesia: General | Site: Shoulder | Laterality: Right | Wound class: Clean

## 2017-09-19 MED ORDER — LIDOCAINE HCL (PF) 1 % IJ SOLN
INTRAMUSCULAR | Status: AC
  Filled 2017-09-19: qty 30

## 2017-09-19 MED ORDER — ONDANSETRON HCL 4 MG/2ML IJ SOLN
INTRAMUSCULAR | Status: DC | PRN
  Administered 2017-09-19: 4 mg via INTRAVENOUS
  Administered 2017-09-19: 8 mg via INTRAVENOUS

## 2017-09-19 MED ORDER — GLYCOPYRROLATE 0.2 MG/ML IJ SOLN
INTRAMUSCULAR | Status: DC | PRN
  Administered 2017-09-19: .2 mg via INTRAVENOUS

## 2017-09-19 MED ORDER — CHLORHEXIDINE GLUCONATE CLOTH 2 % EX PADS: 6.0000 | MEDICATED_PAD | Freq: Once | CUTANEOUS | Status: DC

## 2017-09-19 MED ORDER — CEFAZOLIN SODIUM-DEXTROSE 2-4 GM/100ML-% IV SOLN
INTRAVENOUS | Status: AC
  Filled 2017-09-19: qty 100

## 2017-09-19 MED ORDER — ONDANSETRON HCL 4 MG/2ML IJ SOLN: 4.0000 mg | Freq: Once | INTRAMUSCULAR | Status: DC | PRN

## 2017-09-19 MED ORDER — BUPIVACAINE HCL (PF) 0.25 % IJ SOLN
INTRAMUSCULAR | Status: DC | PRN
  Administered 2017-09-19: 30 mL

## 2017-09-19 MED ORDER — PROPOFOL 10 MG/ML IV BOLUS
INTRAVENOUS | Status: AC
  Filled 2017-09-19: qty 20

## 2017-09-19 MED ORDER — ONDANSETRON HCL 4 MG/2ML IJ SOLN
INTRAMUSCULAR | Status: AC
  Filled 2017-09-19: qty 4

## 2017-09-19 MED ORDER — INSULIN ASPART 100 UNIT/ML ~~LOC~~ SOLN
4.0000 [IU] | Freq: Once | SUBCUTANEOUS | Status: AC
  Administered 2017-09-19: 4 [IU] via SUBCUTANEOUS

## 2017-09-19 MED ORDER — LIDOCAINE HCL (PF) 2 % IJ SOLN
INTRAMUSCULAR | Status: AC
  Filled 2017-09-19: qty 10

## 2017-09-19 MED ORDER — DEXAMETHASONE SODIUM PHOSPHATE 10 MG/ML IJ SOLN
INTRAMUSCULAR | Status: DC | PRN
  Administered 2017-09-19: 5 mg via INTRAVENOUS

## 2017-09-19 MED ORDER — EPINEPHRINE PF 1 MG/ML IJ SOLN
INTRAMUSCULAR | Status: DC | PRN
  Administered 2017-09-19: 10 mL

## 2017-09-19 MED ORDER — SODIUM CHLORIDE 0.9 % IV SOLN
INTRAVENOUS | Status: DC | PRN
  Administered 2017-09-19: 20 ug/min via INTRAVENOUS

## 2017-09-19 MED ORDER — FENTANYL CITRATE (PF) 100 MCG/2ML IJ SOLN
INTRAMUSCULAR | Status: AC
  Filled 2017-09-19: qty 2

## 2017-09-19 MED ORDER — BUPIVACAINE HCL (PF) 0.25 % IJ SOLN
INTRAMUSCULAR | Status: AC
  Filled 2017-09-19: qty 30

## 2017-09-19 MED ORDER — FENTANYL CITRATE (PF) 100 MCG/2ML IJ SOLN
INTRAMUSCULAR | Status: AC
  Administered 2017-09-19: 25 ug via INTRAVENOUS
  Filled 2017-09-19: qty 2

## 2017-09-19 MED ORDER — FENTANYL CITRATE (PF) 100 MCG/2ML IJ SOLN
25.0000 ug | INTRAMUSCULAR | Status: DC | PRN
  Administered 2017-09-19 (×3): 25 ug via INTRAVENOUS

## 2017-09-19 MED ORDER — OXYCODONE HCL 5 MG PO TABS: 5.0000 mg | ORAL_TABLET | ORAL | 0 refills | Status: DC | PRN

## 2017-09-19 MED ORDER — PROPOFOL 10 MG/ML IV BOLUS
INTRAVENOUS | Status: DC | PRN
  Administered 2017-09-19: 150 mg via INTRAVENOUS

## 2017-09-19 MED ORDER — NEOMYCIN-POLYMYXIN B GU 40-200000 IR SOLN
Status: DC | PRN
  Administered 2017-09-19: 2 mL

## 2017-09-19 MED ORDER — DEXMEDETOMIDINE HCL IN NACL 200 MCG/50ML IV SOLN
INTRAVENOUS | Status: AC
  Filled 2017-09-19: qty 50

## 2017-09-19 MED ORDER — FENTANYL CITRATE (PF) 100 MCG/2ML IJ SOLN
INTRAMUSCULAR | Status: DC | PRN
  Administered 2017-09-19: 100 ug via INTRAVENOUS
  Administered 2017-09-19: 50 ug via INTRAVENOUS
  Administered 2017-09-19 (×2): 25 ug via INTRAVENOUS

## 2017-09-19 MED ORDER — MIDAZOLAM HCL 2 MG/2ML IJ SOLN
INTRAMUSCULAR | Status: DC | PRN
  Administered 2017-09-19: 2 mg via INTRAVENOUS

## 2017-09-19 MED ORDER — LIDOCAINE HCL 1 % IJ SOLN
INTRAMUSCULAR | Status: DC | PRN
  Administered 2017-09-19: 22 mL

## 2017-09-19 MED ORDER — SUGAMMADEX SODIUM 500 MG/5ML IV SOLN
INTRAVENOUS | Status: DC | PRN
  Administered 2017-09-19: 250 mg via INTRAVENOUS

## 2017-09-19 MED ORDER — SUCCINYLCHOLINE CHLORIDE 20 MG/ML IJ SOLN
INTRAMUSCULAR | Status: DC | PRN
  Administered 2017-09-19: 100 mg via INTRAVENOUS

## 2017-09-19 MED ORDER — MIDAZOLAM HCL 2 MG/2ML IJ SOLN
INTRAMUSCULAR | Status: AC
  Filled 2017-09-19: qty 2

## 2017-09-19 MED ORDER — ROCURONIUM BROMIDE 100 MG/10ML IV SOLN
INTRAVENOUS | Status: DC | PRN
  Administered 2017-09-19: 50 mg via INTRAVENOUS
  Administered 2017-09-19: 20 mg via INTRAVENOUS
  Administered 2017-09-19: 10 mg via INTRAVENOUS

## 2017-09-19 MED ORDER — DEXMEDETOMIDINE HCL 200 MCG/2ML IV SOLN
INTRAVENOUS | Status: DC | PRN
  Administered 2017-09-19: 12 ug via INTRAVENOUS

## 2017-09-19 MED ORDER — ROCURONIUM BROMIDE 50 MG/5ML IV SOLN
INTRAVENOUS | Status: AC
  Filled 2017-09-19: qty 1

## 2017-09-19 MED ORDER — INSULIN ASPART 100 UNIT/ML ~~LOC~~ SOLN
SUBCUTANEOUS | Status: AC
  Administered 2017-09-19: 4 [IU] via SUBCUTANEOUS
  Filled 2017-09-19: qty 1

## 2017-09-19 MED ORDER — ROCURONIUM BROMIDE 50 MG/5ML IV SOLN
INTRAVENOUS | Status: AC
Start: 2017-09-19 — End: ?
  Filled 2017-09-19: qty 1

## 2017-09-19 MED ORDER — PHENYLEPHRINE HCL 10 MG/ML IJ SOLN
INTRAMUSCULAR | Status: DC | PRN
  Administered 2017-09-19: 100 ug via INTRAVENOUS

## 2017-09-19 MED ORDER — ACETAMINOPHEN 10 MG/ML IV SOLN
INTRAVENOUS | Status: AC
Start: 2017-09-19 — End: ?
  Filled 2017-09-19: qty 100

## 2017-09-19 MED ORDER — SODIUM CHLORIDE 0.9 % IV SOLN
INTRAVENOUS | Status: DC
  Administered 2017-09-19: 07:00:00 via INTRAVENOUS

## 2017-09-19 MED ORDER — ONDANSETRON HCL 4 MG/2ML IJ SOLN
INTRAMUSCULAR | Status: AC
  Filled 2017-09-19: qty 2

## 2017-09-19 MED ORDER — DEXAMETHASONE SODIUM PHOSPHATE 10 MG/ML IJ SOLN
INTRAMUSCULAR | Status: AC
  Filled 2017-09-19: qty 1

## 2017-09-19 MED ORDER — NEOMYCIN-POLYMYXIN B GU 40-200000 IR SOLN
Status: AC
  Filled 2017-09-19: qty 1

## 2017-09-19 MED ORDER — CEFAZOLIN SODIUM-DEXTROSE 2-4 GM/100ML-% IV SOLN
2.0000 g | INTRAVENOUS | Status: AC
  Administered 2017-09-19: 2 g via INTRAVENOUS

## 2017-09-19 MED ORDER — ACETAMINOPHEN 10 MG/ML IV SOLN
INTRAVENOUS | Status: DC | PRN
  Administered 2017-09-19: 1000 mg via INTRAVENOUS

## 2017-09-19 MED ORDER — FENTANYL CITRATE (PF) 100 MCG/2ML IJ SOLN
25.0000 ug | INTRAMUSCULAR | Status: DC | PRN
Start: 2017-09-19 — End: 2017-09-19
  Administered 2017-09-19: 25 ug via INTRAVENOUS

## 2017-09-19 MED ORDER — NEOMYCIN-POLYMYXIN B GU 40-200000 IR SOLN
Status: AC
  Filled 2017-09-19: qty 20

## 2017-09-19 MED ORDER — EPINEPHRINE 30 MG/30ML IJ SOLN
INTRAMUSCULAR | Status: AC
  Filled 2017-09-19: qty 1

## 2017-09-19 MED ORDER — ONDANSETRON HCL 4 MG PO TABS: 4.0000 mg | ORAL_TABLET | Freq: Three times a day (TID) | ORAL | 0 refills | Status: DC | PRN

## 2017-09-19 MED ORDER — SUGAMMADEX SODIUM 500 MG/5ML IV SOLN
INTRAVENOUS | Status: AC
  Filled 2017-09-19: qty 5

## 2017-09-19 SURGICAL SUPPLY — 68 items
ADAPTER IRRIG TUBE 2 SPIKE SOL (ADAPTER) ×4 IMPLANT
ANCHOR ALL-SUT Q-FIX 2.8 (Anchor) ×2 IMPLANT
ANCHOR SUT 5.5 MULTIFIX (Orthopedic Implant) ×2 IMPLANT
BUR RADIUS 4.0X18.5 (BURR) ×2 IMPLANT
BUR RADIUS 5.5 (BURR) ×2 IMPLANT
CANNULA 5.75X7 CRYSTAL CLEAR (CANNULA) ×4 IMPLANT
CANNULA PARTIAL THREAD 2X7 (CANNULA) ×2 IMPLANT
CANNULA TWIST IN 8.25X9CM (CANNULA) IMPLANT
CONNECTOR PERFECT PASSER (CONNECTOR) ×2 IMPLANT
COOLER POLAR GLACIER W/PUMP (MISCELLANEOUS) ×2 IMPLANT
CRADLE LAMINECT ARM (MISCELLANEOUS) ×2 IMPLANT
DEVICE SUCT BLK HOLE OR FLOOR (MISCELLANEOUS) IMPLANT
DRAPE IMP U-DRAPE 54X76 (DRAPES) ×4 IMPLANT
DRAPE INCISE IOBAN 66X45 STRL (DRAPES) ×2 IMPLANT
DRAPE SHEET LG 3/4 BI-LAMINATE (DRAPES) ×2 IMPLANT
DRAPE U-SHAPE 47X51 STRL (DRAPES) IMPLANT
DURAPREP 26ML APPLICATOR (WOUND CARE) ×6 IMPLANT
ELECT REM PT RETURN 9FT ADLT (ELECTROSURGICAL) ×2
ELECTRODE REM PT RTRN 9FT ADLT (ELECTROSURGICAL) ×1 IMPLANT
GAUZE PETRO XEROFOAM 1X8 (MISCELLANEOUS) ×2 IMPLANT
GAUZE SPONGE 4X4 12PLY STRL (GAUZE/BANDAGES/DRESSINGS) ×4 IMPLANT
GLOVE BIOGEL PI IND STRL 9 (GLOVE) ×1 IMPLANT
GLOVE BIOGEL PI INDICATOR 9 (GLOVE) ×1
GLOVE SURG 9.0 ORTHO LTXF (GLOVE) ×4 IMPLANT
GOWN STRL REUS TWL 2XL XL LVL4 (GOWN DISPOSABLE) ×2 IMPLANT
GOWN STRL REUS W/ TWL LRG LVL3 (GOWN DISPOSABLE) ×1 IMPLANT
GOWN STRL REUS W/ TWL LRG LVL4 (GOWN DISPOSABLE) ×1 IMPLANT
GOWN STRL REUS W/TWL LRG LVL3 (GOWN DISPOSABLE) ×1
GOWN STRL REUS W/TWL LRG LVL4 (GOWN DISPOSABLE) ×1
IV LACTATED RINGER IRRG 3000ML (IV SOLUTION) ×10
IV LR IRRIG 3000ML ARTHROMATIC (IV SOLUTION) ×10 IMPLANT
KIT STABILIZATION SHOULDER (MISCELLANEOUS) ×2 IMPLANT
KIT SUTURE 2.8 Q-FIX DISP (MISCELLANEOUS) ×2 IMPLANT
KIT SUTURETAK 3.0 INSERT PERC (KITS) IMPLANT
KIT TURNOVER KIT A (KITS) ×2 IMPLANT
MANIFOLD NEPTUNE II (INSTRUMENTS) ×2 IMPLANT
MASK FACE SPIDER DISP (MASK) ×2 IMPLANT
MAT BLUE FLOOR 46X72 FLO (MISCELLANEOUS) ×4 IMPLANT
NDL SAFETY ECLIPSE 18X1.5 (NEEDLE) ×1 IMPLANT
NEEDLE HYPO 18GX1.5 SHARP (NEEDLE) ×1
NEEDLE HYPO 22GX1.5 SAFETY (NEEDLE) ×2 IMPLANT
NS IRRIG 500ML POUR BTL (IV SOLUTION) ×2 IMPLANT
PACK ARTHROSCOPY SHOULDER (MISCELLANEOUS) ×2 IMPLANT
PAD WRAPON POLAR SHDR XLG (MISCELLANEOUS) ×1 IMPLANT
PASSER SUT CAPTURE FIRST (SUTURE) ×2 IMPLANT
SET TUBE SUCT SHAVER OUTFL 24K (TUBING) ×2 IMPLANT
SET TUBE TIP INTRA-ARTICULAR (MISCELLANEOUS) ×2 IMPLANT
STRAP SAFETY 5IN WIDE (MISCELLANEOUS) ×2 IMPLANT
STRIP CLOSURE SKIN 1/2X4 (GAUZE/BANDAGES/DRESSINGS) ×4 IMPLANT
SUT ETHILON 4-0 (SUTURE) ×1
SUT ETHILON 4-0 FS2 18XMFL BLK (SUTURE) ×1
SUT LASSO 90 DEG SD STR (SUTURE) IMPLANT
SUT MNCRL 4-0 (SUTURE) ×1
SUT MNCRL 4-0 27XMFL (SUTURE) ×1
SUT PDS AB 0 CT1 27 (SUTURE) ×2 IMPLANT
SUT PERFECTPASSER WHITE CART (SUTURE) ×2 IMPLANT
SUT SMART STITCH CARTRIDGE (SUTURE) ×2 IMPLANT
SUT VIC AB 0 CT1 36 (SUTURE) ×2 IMPLANT
SUT VIC AB 2-0 CT2 27 (SUTURE) ×2 IMPLANT
SUTURE ETHLN 4-0 FS2 18XMF BLK (SUTURE) ×1 IMPLANT
SUTURE MAGNUM WIRE 2X48 BLK (SUTURE) IMPLANT
SUTURE MNCRL 4-0 27XMF (SUTURE) ×1 IMPLANT
SYR 10ML LL (SYRINGE) ×2 IMPLANT
TAPE MICROFOAM 4IN (TAPE) ×2 IMPLANT
TUBING ARTHRO INFLOW-ONLY STRL (TUBING) ×2 IMPLANT
TUBING CONNECTING 10 (TUBING) ×2 IMPLANT
WAND HAND CNTRL MULTIVAC 90 (MISCELLANEOUS) ×2 IMPLANT
WRAPON POLAR PAD SHDR XLG (MISCELLANEOUS) ×2

## 2017-09-19 NOTE — Anesthesia Procedure Notes (Signed)
Procedure Name: Intubation Date/Time: 09/19/2017 8:06 AM Performed by: Jonna Clark, CRNA Pre-anesthesia Checklist: Patient identified, Patient being monitored, Timeout performed, Emergency Drugs available and Suction available Patient Re-evaluated:Patient Re-evaluated prior to induction Oxygen Delivery Method: Circle system utilized Preoxygenation: Pre-oxygenation with 100% oxygen Induction Type: IV induction Ventilation: Mask ventilation without difficulty Laryngoscope Size: 3 and McGraph Grade View: Grade II Tube type: Oral Tube size: 7.0 mm Number of attempts: 1 Airway Equipment and Method: Stylet Placement Confirmation: ETT inserted through vocal cords under direct vision,  positive ETCO2 and breath sounds checked- equal and bilateral Secured at: 22 cm Tube secured with: Tape Dental Injury: Teeth and Oropharynx as per pre-operative assessment

## 2017-09-19 NOTE — Anesthesia Post-op Follow-up Note (Signed)
Anesthesia QCDR form completed.        

## 2017-09-19 NOTE — Progress Notes (Signed)
Provided emotional support and offered a calming presence and intercessory prayer. Patient seemed to relax a bit after discussion and prayer.

## 2017-09-19 NOTE — H&P (Signed)
PREOPERATIVE H&P  Chief Complaint: High grade partial thickness rotator cuff tear, right shoulder  HPI: Charlene Daniels is a 47 y.o. female who presents for preoperative history and physical with a diagnosis of high grade partial thickness rotator cuff tear. Symptoms are rated as moderate to severe, and have been worsening.  This is significantly impairing activities of daily living.  She has elected for surgical management.   Past Medical History:  Diagnosis Date  . Arthritis   . Chronic kidney disease    on meds to protect kidney from DM  . Diabetes mellitus without complication ([place])   . GERD (gastroesophageal reflux disease)   . Headache(784.0)   . PONV (postoperative nausea and vomiting)    Past Surgical History:  Procedure Laterality Date  . ABDOMINAL HYSTERECTOMY  2009  . CHOLECYSTECTOMY  1994  . DILATION AND CURETTAGE OF UTERUS  1994  . LYMPH NODE DISSECTION Right 1999   right neck cervical LN dissection after Cat-scratch fever  . SHOULDER ARTHROSCOPY WITH ROTATOR CUFF REPAIR AND SUBACROMIAL DECOMPRESSION Left 02/12/2013   Procedure: LEFT SHOULDER ARTHROSCOPY WITH SUBACROMIAL DECOMPRESSION, DISTAL CLAVICLE RESECTION, POSSIBLE BICEP TENOTOMY, POSSIBLE ROTATOR CUFF REPAIR;  Surgeon: Marin Shutter, MD;  Location: [facility];  Service: Orthopedics;  Laterality: Left;  . TONSILLECTOMY  1998   Social History   Socioeconomic History  . Marital status: Married    Spouse name: Not on file  . Number of children: Not on file  . Years of education: Not on file  . Highest education level: Not on file  Occupational History  . Not on file  Social Needs  . Financial resource strain: Not on file  . Food insecurity:    Worry: Not on file    Inability: Not on file  . Transportation needs:    Medical: Not on file    Non-medical: Not on file  Tobacco Use  . Smoking status: Never Smoker  . Smokeless tobacco: Never Used  Substance and Sexual Activity  . Alcohol use: No  . Drug use: No   . Sexual activity: Not on file  Lifestyle  . Physical activity:    Days per week: Not on file    Minutes per session: Not on file  . Stress: Not on file  Relationships  . Social connections:    Talks on phone: Not on file    Gets together: Not on file    Attends religious service: Not on file    Active member of club or organization: Not on file    Attends meetings of clubs or organizations: Not on file    Relationship status: Not on file  Other Topics Concern  . Not on file  Social History Narrative  . Not on file   Family History  Problem Relation Age of Onset  . Ovarian cancer Mother 39  . Esophageal cancer Father 38  . Breast cancer Maternal Aunt 50   Allergies  Allergen Reactions  . Darvocet [Propoxyphene N-Acetaminophen] Nausea Only and Other (See Comments)    headache  . Ultram [Tramadol] Other (See Comments)    hallucinations  . Sulfa Antibiotics Rash    Rash on wrist   Prior to Admission medications   Medication Sig Start Date End Date Taking? Authorizing Provider  acetaminophen (TYLENOL) 500 MG tablet Take 1,000 mg by mouth 2 (two) times daily as needed (for pain.).   Yes [provider]  Ascorbic Acid (VITAMIN C PO) Take 1 tablet by mouth at bedtime.  Yes [provider]  aspirin EC 81 MG tablet Take 81 mg by mouth at bedtime.   Yes [provider]  cholecalciferol (VITAMIN D) 1000 units tablet Take 1,000 Units by mouth at bedtime.   Yes [provider]  Dulaglutide (TRULICITY) 2.33 AQ/7.6AU SOPN Inject 0.75 mg into the skin once a week.   Yes [provider]  estradiol (ESTRACE) 2 MG tablet Take 1 mg by mouth at bedtime. 08/06/17  Yes [provider]  Garlic 6333 MG CAPS Take 1,000 mg by mouth at bedtime.   Yes [provider]  glimepiride (AMARYL) 2 MG tablet Take 2 mg by mouth daily at 12 noon.  02/06/17  Yes [provider]  HYDROcodone-acetaminophen (NORCO/VICODIN) 5-325 MG tablet Take  1 tablet by mouth at bedtime.  01/16/17  Yes [provider]  ibuprofen (ADVIL,MOTRIN) 200 MG tablet Take 800 mg by mouth 2 (two) times daily as needed (for pain.).   Yes [provider]  KRILL OIL PO Take 1 capsule by mouth at bedtime.   Yes [provider]  lisinopril-hydrochlorothiazide (PRINZIDE,ZESTORETIC) 20-12.5 MG per tablet Take 0.5 tablets by mouth at bedtime.    Yes [provider]  meloxicam (MOBIC) 7.5 MG tablet Take 7.5 mg by mouth daily as needed for pain. 07/22/17  Yes [provider]  metFORMIN (GLUCOPHAGE) 1000 MG tablet Take 1,000 mg by mouth 2 (two) times daily with a meal.   Yes [provider]  OVER THE COUNTER MEDICATION Take 5 drops by mouth 2 (two) times daily. CBD OIL   Yes [provider]  pantoprazole (PROTONIX) 40 MG tablet Take 40 mg by mouth at bedtime.    Yes [provider]  Probiotic Product (PROBIOTIC PO) Take 1 capsule by mouth at bedtime.   Yes [provider]  spironolactone (ALDACTONE) 50 MG tablet Take 50 mg by mouth at bedtime.    Yes [provider]  vitamin B-12 (CYANOCOBALAMIN) 1000 MCG tablet Take 1,000 mcg by mouth at bedtime.   Yes [provider]     Positive ROS: All other systems have been reviewed and were otherwise negative with the exception of those mentioned in the HPI and as above.  Physical Exam: General: Alert, no acute distress Cardiovascular: Regular rate and rhythm, no murmurs rubs or gallops.  No pedal edema Respiratory: Clear to auscultation bilaterally, no wheezes rales or rhonchi. No cyanosis, no use of accessory musculature GI: No organomegaly, abdomen is soft and non-tender nondistended with positive bowel sounds. Skin: Skin intact, no lesions within the operative field. Neurologic: Sensation intact distally Psychiatric: Patient is competent for consent with normal mood and affect Lymphatic: No cervical  lymphadenopathy  MUSCULOSKELETAL: Right shoulder: Patient has pain with abduction of 90 degrees.  She is increased pain and weakness with a downward directed force on her abducted shoulder.  She does not demonstrate external rotator weakness or weakness of the subscapularis.  She has 2+ radial pulse.  She has full digital wrist and elbow range of motion.  She has positive impingement signs but no apprehension or instability.  Assessment: High grade partial thickness rotator cuff tear, right shoulder  Plan: Plan for Procedure(s): RIGHT SHOULDER ARTHROSCOPY WITH MINI-OPEN ROTATOR CUFF REPAIR  I have described the operation in detail to the patient and her husband.  We discussed the postoperative course as well.  I discussed the risks and benefits of surgery. The risks include but are not limited to infection, bleeding requiring blood transfusion, nerve  or blood vessel injury, joint stiffness or loss of motion, persistent pain, weakness or instability, malunion, nonunion and hardware failure and the need for further surgery. Medical risks include but are not limited to DVT and pulmonary embolism, myocardial infarction, stroke, pneumonia, respiratory failure and death. Patient understood these risks and wished to proceed.    Thornton Park, MD   09/19/2017 7:45 AM

## 2017-09-19 NOTE — Op Note (Signed)
09/19/2017  10:50 AM  PATIENT:  Charlene Daniels  47 y.o. female  PRE-OPERATIVE DIAGNOSIS: Right shoulder high-grade partial-thickness tear of the supraspinatus tendon  POST-OPERATIVE DIAGNOSIS: Same  PROCEDURE:  Procedure(s): SHOULDER ARTHROSCOPY WITH OPEN ROTATOR CUFF REPAIR & SUBACROMINAL DECOMPRESSION. (Right)  SURGEON:  Surgeon(s) and Role:    * Thornton Park, MD - Primary  ANESTHESIA:   local and general   PREOPERATIVE INDICATIONS:  [name] is a  47 y.o. female with a diagnosis of right shoulder high-grade partial-thickness tear of the supraspinatus tendon, not trauma failed conservative treatment and elected for surgical management.    The risks benefits and alternatives were discussed with the patient preoperatively including but not limited to the risks of infection, bleeding, nerve injury, persistent pain or weakness, failure of the hardware, re-tear of the rotator cuff and the need for further surgery. Medical risks include DVT and pulmonary embolism, myocardial infarction, stroke, pneumonia, respiratory failure and death. Patient understood these risks and wished to proceed.  OPERATIVE IMPLANTS: [place] Multifix S Ultra anchor x 1 & [employer] Q Fix anchors x 1  OPERATIVE FINDINGS: High-grade partial-thickness tear of the supraspinatus tendon of the right shoulder with subacromial spurring.  Partial-thickness tear of the subscapularis without retraction.  No tear or dislocation of the intra-articular portion of the biceps tendon  OPERATIVE PROCEDURE: The patient was met in the preoperative area. The right shoulder was signed with the word yes and my initials according the hospital's correct site of surgery protocol.   Preoperative history and physical was performed.  Patient was brought to the operating room where she underwent general anesthesia.  The patient was placed in a beachchair position.  A spider arm positioner was used for this case. Examination  under anesthesia revealed no limitation of motion or instability with load shift testing. The patient had a negative sulcus sign.  Patient was prepped and draped in a sterile fashion. A timeout was performed to verify the patient's name, date of birth, medical record number, correct site of surgery and correct procedure to be performed there was also used to verify the patient received antibiotics that all appropriate instruments, implants and radiographs studies were available in the room. Once all in attendance were in agreement case began.  Bony landmarks were drawn out with a surgical marker along with proposed arthroscopy incisions. These were pre-injected with 1% lidocaine plain. An 11 blade was used to establish a posterior portal through which the arthroscope was placed in the glenohumeral joint. A full diagnostic examination of the shoulder was performed.  Patient was found to have a high-grade partial-thickness tear involving the supraspinatus tendon. An 18-gauge spinal needle was used to place a  0 PDS suture through the rotator cuff tear for identification from the bursal side.  The arthroscope was then placed in the subacromial space.  Extensive bursitis was encountered and debrided using a 4-0 resector shaver blade and a 90 ArthroCare wand from a lateral portal which was established under direct visualization using an 18-gauge spinal needle. A subacromial decompression was also performed using a 5.5 mm resector shaver blade from the lateral portal.  The 0 PDS suture was identified.  The partial-thickness tear was then completed with a 4.0 resector shaver blade.  Two perfect Pass sutures were placed in the lateral border of the rotator cuff tear. All arthroscopic instruments were then removed and the mini-open portion of the procedure began.  A saber-type incision was made along the lateral border  of the acromion. The deltoid muscle was identified and split in line with its fibers which allowed  visualization of the rotator cuff. The Perfect Pass sutures previously placed in the lateral border of the rotator cuff were brought out through the deltoid split.  A 5.5 mm resector shaver blade was then used to debride the greater tuberosity of all torn fibers of the rotator cuff.  A single [employer] anchor was placed at the articular margin of the humeral head with the greater tuberosity. The suture limbs of the Q Fix anchor were passed medially through the rotator cuff using a First Pass suture passer.   The two Perfect Pass sutures were then anchored to the greater tuberosity footprint using [employer] single multifix S ultra anchor. The sutures were tensioned to allow for anatomic reduction of the rotator cuff to the greater tuberosity. The medial row sutures were then tied down using an arthroscopic knot tying technique.  Arthroscopic images of the repair were taken with the arthroscope both externally and from inside the glenohumeral joint.  All incisions were copiously irrigated. The deltoid fascia was repaired using a 0 Vicryl suture.  The subcutaneous tissue of all incisions were closed with a 2-0 Vicryl. Skin closure for the arthroscopic incisions was performed with 4-0 nylon. The skin edges of the saber incision was approximated with a running 4-0 undyed Monocryl.  0.25% Marcaine plain was then injected into the subacromial space for postoperative pain control. A dry sterile dressing was applied.  The patient was placed in an abduction sling and a Polar Care was applied to the shoulder.  All sharp and it instrument counts were correct at the conclusion of the case. I was scrubbed and present for the entire case. I spoke with the patient's husband postoperatively to let him know the case had been performed without complication and the patient was stable in recovery room.

## 2017-09-19 NOTE — Progress Notes (Signed)
Pt CBG prior to surgery was 217. Post surgery CBG: 256. Dr. Andree Elk notified. Acknowledged. Orders received. [name] E 11:05 AM 09/19/2017

## 2017-09-19 NOTE — Discharge Instructions (Signed)

## 2017-09-19 NOTE — Anesthesia Preprocedure Evaluation (Signed)
Anesthesia Evaluation  Patient identified by MRN, date of birth, ID band Patient awake    Reviewed: Allergy & Precautions, H&P , NPO status , Patient's Chart, lab work & pertinent test results, reviewed documented beta blocker date and time   History of Anesthesia Complications (+) PONV and history of anesthetic complications  Airway Mallampati: II  TM Distance: >3 FB Neck ROM: full    Dental  (+) Teeth Intact   Pulmonary neg pulmonary ROS,    Pulmonary exam normal        Cardiovascular Exercise Tolerance: Good negative cardio ROS Normal cardiovascular exam Rhythm:regular Rate:Normal     Neuro/Psych  Headaches, negative neurological ROS  negative psych ROS   GI/Hepatic negative GI ROS, Neg liver ROS, GERD  Medicated,  Endo/Other  negative endocrine ROSdiabetes  Renal/GU Renal diseasenegative Renal ROS  negative genitourinary   Musculoskeletal   Abdominal   Peds  Hematology negative hematology ROS (+)   Anesthesia Other Findings Past Medical History: No date: Arthritis No date: Chronic kidney disease     Comment:  on meds to protect kidney from DM No date: Diabetes mellitus without complication ([facility]) No date: GERD (gastroesophageal reflux disease) No date: Headache(784.0) No date: PONV (postoperative nausea and vomiting) Past Surgical History: 2009: ABDOMINAL HYSTERECTOMY 1994: CHOLECYSTECTOMY 1994: DILATION AND CURETTAGE OF UTERUS 1999: LYMPH NODE DISSECTION; Right     Comment:  right neck cervical LN dissection after Cat-scratch               fever 02/12/2013: SHOULDER ARTHROSCOPY WITH ROTATOR CUFF REPAIR AND  SUBACROMIAL DECOMPRESSION; Left     Comment:  Procedure: LEFT SHOULDER ARTHROSCOPY WITH SUBACROMIAL               DECOMPRESSION, DISTAL CLAVICLE RESECTION, POSSIBLE BICEP               TENOTOMY, POSSIBLE ROTATOR CUFF REPAIR;  Surgeon: Marin Shutter, MD;  Location: [facility];  Service:  Orthopedics;                Laterality: Left; 1998: TONSILLECTOMY BMI    Body Mass Index:  45.93 kg/m     Reproductive/Obstetrics negative OB ROS                             Anesthesia Physical Anesthesia Plan  ASA: III  Anesthesia Plan: General ETT   Post-op Pain Management:    Induction:   PONV Risk Score and Plan:   Airway Management Planned:   Additional Equipment:   Intra-op Plan:   Post-operative Plan:   Informed Consent: I have reviewed the patients History and Physical, chart, labs and discussed the procedure including the risks, benefits and alternatives for the proposed anesthesia with the patient or authorized representative who has indicated his/her understanding and acceptance.   Dental Advisory Given  Plan Discussed with: CRNA  Anesthesia Plan Comments:         Anesthesia Quick Evaluation

## 2017-09-19 NOTE — Transfer of Care (Signed)
Immediate Anesthesia Transfer of Care Note  Patient: Charlene Daniels  Procedure(s) Performed: SHOULDER ARTHROSCOPY WITH OPEN ROTATOR CUFF REPAIR & SUBACROMINAL DECOMPRESSION. (Right Shoulder)  Patient Location: PACU  Anesthesia Type:General  Level of Consciousness: sedated and responds to stimulation  Airway & Oxygen Therapy: Patient Spontanous Breathing and Patient connected to face mask oxygen  Post-op Assessment: Report given to RN and Post -op Vital signs reviewed and stable  Post vital signs: Reviewed and stable  Last Vitals:  Vitals Value Taken Time  BP 114/55 09/19/2017 10:49 AM  Temp 35.8 C 09/19/2017 10:49 AM  Pulse 75 09/19/2017 10:52 AM  Resp 17 09/19/2017 10:52 AM  SpO2 95 % 09/19/2017 10:52 AM  Vitals shown include unvalidated device data.  Last Pain:  Vitals:   09/19/17 1049  TempSrc:   PainSc: Asleep         Complications: No apparent anesthesia complications

## 2017-09-20 NOTE — Addendum Note (Signed)
Addendum  created 09/20/17 1631 by Doreen Salvage, CRNA   Charge Capture section accepted

## 2017-09-20 NOTE — Anesthesia Postprocedure Evaluation (Signed)
Anesthesia Post Note  Patient: Charlene Daniels  Procedure(s) Performed: SHOULDER ARTHROSCOPY WITH OPEN ROTATOR CUFF REPAIR & SUBACROMINAL DECOMPRESSION. (Right Shoulder)  Anesthesia Type: General     Last Vitals:  Vitals:   09/19/17 1216 09/19/17 1345  BP: (!) 162/106 (!) 177/89  Pulse:    Resp:    Temp:    SpO2:      Last Pain:  Vitals:   09/20/17 0908  TempSrc:   PainSc: 2                  Molli Barrows

## 2017-09-30 DIAGNOSIS — M25511 Pain in right shoulder: Secondary | ICD-10-CM | POA: Diagnosis not present

## 2017-09-30 DIAGNOSIS — M25611 Stiffness of right shoulder, not elsewhere classified: Secondary | ICD-10-CM | POA: Diagnosis not present

## 2017-10-02 DIAGNOSIS — Z0000 Encounter for general adult medical examination without abnormal findings: Secondary | ICD-10-CM | POA: Diagnosis not present

## 2017-10-02 DIAGNOSIS — E119 Type 2 diabetes mellitus without complications: Secondary | ICD-10-CM | POA: Diagnosis not present

## 2017-10-04 DIAGNOSIS — M25611 Stiffness of right shoulder, not elsewhere classified: Secondary | ICD-10-CM | POA: Diagnosis not present

## 2017-10-04 DIAGNOSIS — M25511 Pain in right shoulder: Secondary | ICD-10-CM | POA: Diagnosis not present

## 2017-10-07 DIAGNOSIS — M25611 Stiffness of right shoulder, not elsewhere classified: Secondary | ICD-10-CM | POA: Diagnosis not present

## 2017-10-07 DIAGNOSIS — M25511 Pain in right shoulder: Secondary | ICD-10-CM | POA: Diagnosis not present

## 2017-10-10 DIAGNOSIS — M25511 Pain in right shoulder: Secondary | ICD-10-CM | POA: Diagnosis not present

## 2017-10-10 DIAGNOSIS — M25611 Stiffness of right shoulder, not elsewhere classified: Secondary | ICD-10-CM | POA: Diagnosis not present

## 2017-10-14 DIAGNOSIS — M25511 Pain in right shoulder: Secondary | ICD-10-CM | POA: Diagnosis not present

## 2017-10-14 DIAGNOSIS — M25611 Stiffness of right shoulder, not elsewhere classified: Secondary | ICD-10-CM | POA: Diagnosis not present

## 2017-10-17 DIAGNOSIS — M25611 Stiffness of right shoulder, not elsewhere classified: Secondary | ICD-10-CM | POA: Diagnosis not present

## 2017-10-17 DIAGNOSIS — M25511 Pain in right shoulder: Secondary | ICD-10-CM | POA: Diagnosis not present

## 2017-10-21 DIAGNOSIS — M25611 Stiffness of right shoulder, not elsewhere classified: Secondary | ICD-10-CM | POA: Diagnosis not present

## 2017-10-21 DIAGNOSIS — M25511 Pain in right shoulder: Secondary | ICD-10-CM | POA: Diagnosis not present

## 2017-10-25 DIAGNOSIS — M25511 Pain in right shoulder: Secondary | ICD-10-CM | POA: Diagnosis not present

## 2017-10-25 DIAGNOSIS — M25611 Stiffness of right shoulder, not elsewhere classified: Secondary | ICD-10-CM | POA: Diagnosis not present

## 2017-10-28 DIAGNOSIS — M25511 Pain in right shoulder: Secondary | ICD-10-CM | POA: Diagnosis not present

## 2017-10-28 DIAGNOSIS — M25611 Stiffness of right shoulder, not elsewhere classified: Secondary | ICD-10-CM | POA: Diagnosis not present

## 2017-10-31 DIAGNOSIS — M25611 Stiffness of right shoulder, not elsewhere classified: Secondary | ICD-10-CM | POA: Diagnosis not present

## 2017-10-31 DIAGNOSIS — M25511 Pain in right shoulder: Secondary | ICD-10-CM | POA: Diagnosis not present

## 2017-11-04 DIAGNOSIS — M25511 Pain in right shoulder: Secondary | ICD-10-CM | POA: Diagnosis not present

## 2017-11-04 DIAGNOSIS — M25611 Stiffness of right shoulder, not elsewhere classified: Secondary | ICD-10-CM | POA: Diagnosis not present

## 2017-11-11 DIAGNOSIS — M25511 Pain in right shoulder: Secondary | ICD-10-CM | POA: Diagnosis not present

## 2017-11-11 DIAGNOSIS — M25611 Stiffness of right shoulder, not elsewhere classified: Secondary | ICD-10-CM | POA: Diagnosis not present

## 2017-11-19 ENCOUNTER — Other Ambulatory Visit: Payer: Self-pay | Admitting: Orthopedic Surgery

## 2017-11-19 DIAGNOSIS — M25511 Pain in right shoulder: Secondary | ICD-10-CM

## 2017-11-29 ENCOUNTER — Ambulatory Visit
Admission: RE | Admit: 2017-11-29 | Discharge: 2017-11-29 | Disposition: A | Discharge status: Home / Self Care | Payer: 59 | Source: Ambulatory Visit | Attending: Orthopedic Surgery | Admitting: Orthopedic Surgery

## 2017-11-29 DIAGNOSIS — M75111 Incomplete rotator cuff tear or rupture of right shoulder, not specified as traumatic: Secondary | ICD-10-CM | POA: Insufficient documentation

## 2017-11-29 DIAGNOSIS — M25511 Pain in right shoulder: Secondary | ICD-10-CM | POA: Diagnosis present

## 2017-11-29 DIAGNOSIS — S46011A Strain of muscle(s) and tendon(s) of the rotator cuff of right shoulder, initial encounter: Secondary | ICD-10-CM | POA: Diagnosis not present

## 2017-12-13 DIAGNOSIS — M25511 Pain in right shoulder: Secondary | ICD-10-CM | POA: Diagnosis not present

## 2017-12-16 DIAGNOSIS — M25511 Pain in right shoulder: Secondary | ICD-10-CM | POA: Diagnosis not present

## 2017-12-26 ENCOUNTER — Ambulatory Visit: Payer: 59 | Attending: Orthopedic Surgery

## 2017-12-26 DIAGNOSIS — M25511 Pain in right shoulder: Secondary | ICD-10-CM | POA: Diagnosis not present

## 2017-12-26 DIAGNOSIS — M6281 Muscle weakness (generalized): Secondary | ICD-10-CM | POA: Insufficient documentation

## 2017-12-26 DIAGNOSIS — S46011A Strain of muscle(s) and tendon(s) of the rotator cuff of right shoulder, initial encounter: Secondary | ICD-10-CM | POA: Diagnosis not present

## 2017-12-26 NOTE — Patient Instructions (Signed)
Access Code: [email or web address]  URL: [email or web address]  Date: 12/26/2017  Prepared by: Janna Arch   Exercises  Seated Scapular Retraction - 10 reps - 2 sets - 5 hold - 1x daily - 7x weekly  Isometric Shoulder Abduction at Wall - 10 reps - 1 sets - 5 hold - 1x daily - 7x weekly  Isometric Shoulder Extension at Wall - 10 reps - 1 sets - 5 hold - 1x daily - 7x weekly  Isometric Shoulder Flexion at Wall - 10 reps - 1 sets - 5 hold - 1x daily - 7x weekly  Isometric Shoulder External Rotation at Wall - 10 reps - 1 sets - 5 hold - 1x daily - 7x weekly  Isometric Shoulder Flexion with Ball at [place] 10 reps - 1 sets - 5 hold - 1x daily - 7x weekly

## 2017-12-26 NOTE — Therapy (Addendum)
[place] MAIN [facility] SERVICES [address] [place], [place], [address] Phone: [phone]   Fax:  [phone]  Physical Therapy Evaluation  Patient Details  Name: Charlene Daniels MRN: [id number] Date of Birth: 1970-09-12 No data recorded  Encounter Date: 12/26/2017  PT End of Session - 12/26/17 1810    Visit Number  1    Number of Visits  8    Date for PT Re-Evaluation  01/23/18    Authorization Type  1/10    PT Start Time  4431    PT Stop Time  5400    PT Time Calculation (min)  50 min    Activity Tolerance  Patient limited by pain;Patient tolerated treatment well    Behavior During Therapy  [facility] for tasks assessed/performed       Past Medical History:  Diagnosis Date  . Arthritis   . Chronic kidney disease    on meds to protect kidney from DM  . Diabetes mellitus without complication ([place])   . GERD (gastroesophageal reflux disease)   . Headache(784.0)   . PONV (postoperative nausea and vomiting)     Past Surgical History:  Procedure Laterality Date  . ABDOMINAL HYSTERECTOMY  2009  . CHOLECYSTECTOMY  1994  . DILATION AND CURETTAGE OF UTERUS  1994  . LYMPH NODE DISSECTION Right 1999   right neck cervical LN dissection after Cat-scratch fever  . SHOULDER ARTHROSCOPY WITH OPEN ROTATOR CUFF REPAIR Right 09/19/2017   Procedure: SHOULDER ARTHROSCOPY WITH OPEN ROTATOR CUFF REPAIR & SUBACROMINAL DECOMPRESSION.;  Surgeon: Thornton Park, MD;  Location: [facility] ORS;  Service: Orthopedics;  Laterality: Right;  . SHOULDER ARTHROSCOPY WITH ROTATOR CUFF REPAIR AND SUBACROMIAL DECOMPRESSION Left 02/12/2013   Procedure: LEFT SHOULDER ARTHROSCOPY WITH SUBACROMIAL DECOMPRESSION, DISTAL CLAVICLE RESECTION, POSSIBLE BICEP TENOTOMY, POSSIBLE ROTATOR CUFF REPAIR;  Surgeon: Marin Shutter, MD;  Location: [place];  Service: Orthopedics;  Laterality: Left;  . TONSILLECTOMY  1998    There were no vitals filed for this visit.      PAIN: Current pain  6/10 on pain meds Worst pain: 9/10  Observation; Scapula tracking well with RUE movement, noted tenderness to palpation of R bicep, no tenderness to [place] palpation. Scar tissues slightly raised.      [facility] PT Assessment - 12/26/17 1735      Assessment   Medical Diagnosis  R shoulder Pain    Hand Dominance  Right    Prior Therapy  yes      Precautions   Precautions  None      Restrictions   Weight Bearing Restrictions  No      Balance Screen   Has the patient fallen in the past 6 months  No    Has the patient had a decrease in activity level because of a fear of falling?   Yes    Is the patient reluctant to leave their home because of a fear of falling?   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[employer],       Cognition   Overall Cognitive Status  Within Functional Limits for tasks assessed      Observation/Other Assessments   Observations  muscle gaurding of R shoulder, R shoulder raise     Other Surveys   Other Surveys      Observation/Other Assessments-Edema    Edema  --   R cervical edema     Sensation   Light Touch  Appears Intact      Coordination   Gross Motor Movements are Fluid and Coordinated  No    Fine Motor Movements are Fluid and Coordinated  No    Coordination and Movement Description  coordination limited by pain      Posture/Postural Control   Posture/Postural Control  Postural limitations    Postural Limitations  Rounded Shoulders;Forward head   guarding of R GH joint     ROM / Strength   AROM / PROM / Strength  AROM;PROM;Strength      AROM    Overall AROM   Deficits;Due to pain    AROM Assessment Site  Shoulder;Elbow;Wrist    Right/Left Shoulder  Right;Left    Right Shoulder Extension  5 Degrees   pain   Right Shoulder Flexion  95 Degrees   slight scaption with pain   Right Shoulder ABduction  99 Degrees   painful   Right Shoulder Internal Rotation  --   unable to obtain position   Right Shoulder External Rotation  --   unable to obtain position    Left Shoulder Extension  52 Degrees    Left Shoulder Flexion  170 Degrees    Left Shoulder ABduction  164 Degrees    Left Shoulder Internal Rotation  88 Degrees    Left Shoulder External Rotation  84 Degrees    Right/Left Elbow  Right;Left    Right Elbow Flexion  --   limited by pain and body habitus   Right Elbow Extension  0    Left Elbow Flexion  120    Left Elbow Extension  0    Right/Left Wrist  Left    Right Wrist Extension  --   WFL   Right Wrist Flexion  --   WFL   Left Wrist Extension  --   WFL   Left Wrist Flexion  --   WFL     PROM   Overall PROM   Deficits;Due to pain    Overall PROM Comments  supine with pillow under arm     PROM Assessment Site  Shoulder    Right/Left Shoulder  Right    Right Shoulder Flexion  162 Degrees    Right Shoulder ABduction  102 Degrees    Right Shoulder Internal Rotation  41 Degrees   pain in ROM assessment location   Right Shoulder External Rotation  39 Degrees   pain     Strength   Overall Strength  Deficits;Due to pain    Strength Assessment Site  Shoulder;Elbow;Wrist    Right/Left Shoulder  Right;Left    Right Shoulder Flexion  2/5    Right Shoulder Extension  2+/5    Right Shoulder ABduction  2/5    Right Shoulder Internal Rotation  2+/5    Right Shoulder External Rotation  2+/5    Left Shoulder Flexion  5/5    Left Shoulder Extension  5/5    Left Shoulder ABduction  5/5  Left Shoulder Internal Rotation  5/5    Left Shoulder External Rotation  5/5    Right/Left Elbow  Right;Left    Right Elbow Flexion   3-/5    Right Elbow Extension  3/5    Left Elbow Flexion  5/5    Left Elbow Extension  5/5    Right/Left Wrist  Right;Left    Right Wrist Flexion  4-/5    Right Wrist Extension  4-/5    Left Wrist Flexion  4+/5    Left Wrist Extension  4+/5      Flexibility   Soft Tissue Assessment /Muscle Length  yes   limited levator scap, upper trap R side     Palpation   Palpation comment  Hypomobile empty PA, AP, and inferior grade I, non tender to palpation      Special Tests    Special Tests  --              Treat   Access Code: [email or web address]  URL: [email or web address]  Date: 12/26/2017  Prepared by: Janna Arch   Exercises  Seated Scapular Retraction - 10 reps - 2 sets - 5 hold - 1x daily - 7x weekly  Isometric Shoulder Abduction at Wall - 10 reps - 1 sets - 5 hold - 1x daily - 7x weekly  Isometric Shoulder Extension at Wall - 10 reps - 1 sets - 5 hold - 1x daily - 7x weekly  Isometric Shoulder Flexion at Wall - 10 reps - 1 sets - 5 hold - 1x daily - 7x weekly  Isometric Shoulder External Rotation at Wall - 10 reps - 1 sets - 5 hold - 1x daily - 7x weekly  Isometric Shoulder Flexion with Ball at [place] - 10 reps - 1 sets - 5 hold - 1x daily - 7x weekly      Objective measurements completed on examination: See above findings.              PT Education - 12/26/17 1809    Education Details  HEP, POC, if movements painful stop, sleeping positions, icing    Person(s) Educated  Patient    Methods  Explanation;Demonstration;Verbal cues;Tactile cues;Handout    Comprehension  Verbalized understanding;Returned demonstration;Need further instruction       PT Short Term Goals - 12/26/17 1815      PT SHORT TERM GOAL #1   Title  Patient will be independent in home exercise program to improve strength/mobility for better functional independence with ADLs.    Baseline  HEP given    Time  2    Period  Weeks    Status  New    Target Date  01/09/18      PT  SHORT TERM GOAL #2   Title  Patient will report a worst pain of 7/10 on VAS in  R shoulder to improve tolerance with ADLs and reduced symptoms with activities.     Baseline  8/8: 9/10     Time  2    Period  Weeks    Status  New    Target Date  01/09/18        PT Long Term Goals - 12/26/17 1816      PT LONG TERM GOAL #1   Title  Patient will report a worst pain of 5/10 on VAS in    R shoulder         to improve tolerance with ADLs and reduced symptoms with activities.  Baseline  8/8: 9/10     Time  4    Period  Weeks    Status  New    Target Date  01/23/18      PT LONG TERM GOAL #2   Title  Patient will improve shoulder AROM to > 140 degrees of flexion, scaption, and abduction for improved ability to perform overhead activities.    Baseline  8/8: AROM standing: flexion 95, abduction 99    Time  4    Period  Weeks    Status  New    Target Date  01/23/18      PT LONG TERM GOAL #3   Title  Patient will decrease Quick DASH score by > 8 points (gross 78%, work 92%) demonstrating reduced self-reported upper extremity disability.    Baseline  8/8: 86% work 100%    Time  4    Period  Weeks    Status  New    Target Date  01/23/18      PT LONG TERM GOAL #4   Title  Patient will demonstrate 4/5 strength in RUE to allow patient to return to work and perform job duties without compensations.     Baseline  8/8: strength limited by ROM and pain    Time  4    Period  Weeks    Status  New    Target Date  01/23/18             Plan - 12/26/17 1810    Clinical Impression Statement  Patient is a pleasant 47 year old female who presents s/p  right shoulder arthroscopy with mini open rotator cuff repair on 5/2. Patient re-injured shoulder with imaging showing :"severe tendinosis of the supraspinatus tendon with a high-grade partial-thickness articular    History and Personal Factors relevant to plan of care:  This patient presents with  1- 2, , personal factors/ comorbidities, and 3  ,  body elements including body structures and functions, activity limitations and or participation restrictions. Patient's condition is unstable    Clinical Presentation  Unstable    Clinical Presentation due to:  unstable pain levels. muscle guarding and re-injury     Clinical Decision Making  Moderate    Rehab Potential  Fair    Clinical Impairments Affecting Rehab Potential  (+) age, educational level (-) secondary injury     PT Frequency  2x / week    PT Duration  4 weeks    PT Treatment/Interventions  ADLs/Self Care Home Management;Aquatic Therapy;Cryotherapy;Electrical Stimulation;Parrafin;Ultrasound;Traction;Moist Heat;Iontophoresis 4mg /ml Dexamethasone;Functional mobility training;Therapeutic activities;Therapeutic exercise;Patient/family education;Neuromuscular re-education;Manual techniques;Manual lymph drainage;Compression bandaging;Taping;Energy conservation;Dry needling;Passive range of motion;Scar mobilization    PT Next Visit Plan  review HEP, abduction PROM, introduce AAROM, ice cup massage    PT Home Exercise Plan  see sheet    Consulted and Agree with Plan of Care  Patient       Patient will benefit from skilled therapeutic intervention in order to improve the following deficits and impairments:  Decreased activity tolerance, Decreased coordination, Decreased endurance, Decreased mobility, Decreased range of motion, Decreased strength, Decreased scar mobility, Hypomobility, Increased edema, Impaired flexibility, Impaired perceived functional ability, Increased muscle spasms, Impaired UE functional use, Postural dysfunction, Improper body mechanics, Pain  Visit Diagnosis: Acute pain of right shoulder - Plan: PT plan of care cert/re-cert  Muscle weakness (generalized) - Plan: PT plan of care cert/re-cert     Problem List There are no active problems to display for this patient.  [employer]  Ermalene Postin, PT, DPT   12/26/2017, 6:24 PM  [place]  MAIN [facility] SERVICES [address] [place], [place], [address] Phone: [phone]   Fax:  [phone]  Name: RIANNAH STAGNER MRN: [id number] Date of Birth: 03-14-1971

## 2017-12-31 ENCOUNTER — Ambulatory Visit: Payer: 59 | Admitting: Physical Therapy

## 2017-12-31 ENCOUNTER — Encounter: Payer: Self-pay | Admitting: Physical Therapy

## 2017-12-31 DIAGNOSIS — S46011A Strain of muscle(s) and tendon(s) of the rotator cuff of right shoulder, initial encounter: Secondary | ICD-10-CM | POA: Diagnosis not present

## 2017-12-31 DIAGNOSIS — M6281 Muscle weakness (generalized): Secondary | ICD-10-CM

## 2017-12-31 DIAGNOSIS — M25511 Pain in right shoulder: Secondary | ICD-10-CM

## 2017-12-31 NOTE — Therapy (Addendum)
[place] MAIN [facility] SERVICES [address] [place], [place], [address] Phone: [phone]   Fax:  [phone]  Physical Therapy Treatment  Patient Details  Name: Charlene Daniels MRN: [id number] Date of Birth: 05-26-1970 No data recorded  Encounter Date: 12/31/2017  PT End of Session - 12/31/17 1700    Visit Number  2    Number of Visits  8    Date for PT Re-Evaluation  01/23/18    Authorization Type  2/10    PT Start Time  1645    PT Stop Time  1730    PT Time Calculation (min)  45 min    Activity Tolerance  Patient limited by pain;Patient tolerated treatment well    Behavior During Therapy  [facility] for tasks assessed/performed       Past Medical History:  Diagnosis Date  . Arthritis   . Chronic kidney disease    on meds to protect kidney from DM  . Diabetes mellitus without complication ([place])   . GERD (gastroesophageal reflux disease)   . Headache(784.0)   . PONV (postoperative nausea and vomiting)     Past Surgical History:  Procedure Laterality Date  . ABDOMINAL HYSTERECTOMY  2009  . CHOLECYSTECTOMY  1994  . DILATION AND CURETTAGE OF UTERUS  1994  . LYMPH NODE DISSECTION Right 1999   right neck cervical LN dissection after Cat-scratch fever  . SHOULDER ARTHROSCOPY WITH OPEN ROTATOR CUFF REPAIR Right 09/19/2017   Procedure: SHOULDER ARTHROSCOPY WITH OPEN ROTATOR CUFF REPAIR & SUBACROMINAL DECOMPRESSION.;  Surgeon: Thornton Park, MD;  Location: [facility]S;  Service: Orthopedics;  Laterality: Right;  . SHOULDER ARTHROSCOPY WITH ROTATOR CUFF REPAIR AND SUBACROMIAL DECOMPRESSION Left 02/12/2013   Procedure: LEFT SHOULDER ARTHROSCOPY WITH SUBACROMIAL DECOMPRESSION, DISTAL CLAVICLE RESECTION, POSSIBLE BICEP TENOTOMY, POSSIBLE ROTATOR CUFF REPAIR;  Surgeon: Marin Shutter, MD;  Location: [place];  Service: Orthopedics;  Laterality: Left;  . TONSILLECTOMY  1998    There were no vitals filed for this visit.  Subjective Assessment -  12/31/17 1646    Subjective  Patient is doing well today; reports she is having some pain and took ibuprofen around lunchtime.    Pertinent History  atient was 8 weeks out right shoulder arthroscopy with mini open rotator cuff repair (performed 5/2)  when she moved her waist band feeling like "her arm had been ripped off". Since this moment on has not been out of pain. Went back to PT and her therapist felt like it had been retorn since she was not able to move arm. Mack Guise said MRI was false positive on tear, wanted pt to go to neurology thinking pain is from neck.  Was thinking it potentially could be complex regional pain syndrome. Patient wanted second opinion so went to [facility] in [place] to [employer]. Was not torn, anchor is lifted, where the repair is was not torn. Prior rotator cuff repair. Severe tendinosis of the supraspinatus tendon with a high-grade partial-thickness articular surface tear with a possible small full-thickness component. Severe tendinosis of the infraspinatus tendon.. Finished up steroid taper form [place] which helped with numb and ache.   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 Still not sleeping in bed , wants to get out of recliner. Has not had PT for 10 days. Patient has lymphedema on R  Side.     Limitations  Reading;Lifting;Sitting;Walking;House hold activities;Other (comment)    Diagnostic tests  MRI: Prior rotator cuff repair. Severe tendinosis of the  Patient Stated Goals  to reduce pain, return to work duties, improve ROM and strength     Currently in Pain?  Yes    Pain Score  4     Pain Location  Shoulder    Pain Orientation  Right    Pain Descriptors / Indicators  Aching    Pain Type  Acute pain;Surgical pain    Pain Onset  More than a month ago    Pain Frequency  Constant       Treatment Warm up with UE ranger seated with ranger on floor, perform alphabet with R arm  AAROM supine with cane:  -Flexion 2x10 reps -ER 2x10 reps  UE  ranger -Horizontal ABD/ADD x1 min bouts x2 bouts  Scar tissue cross-friction assessment: some increased adhesion noted at most anterior scar and superior lateral scar at head of humerus   Isometrics standing against wall: -Flexion 3 sec holds x10 reps -Extension 3 sec holds x10 reps -ER 3 sec holds x10 reps -Abduction 3 sec holds x10 reps -IR 3 sec holds x10 reps  Icing at end of session x4 min and discussion of continued HEP                        PT Education - 12/31/17 1652    Education Details  AAROM, UE ranger, isometric strengthening    Person(s) Educated  Patient    Methods  Explanation;Demonstration;Verbal cues    Comprehension  Verbalized understanding;Returned demonstration;Verbal cues required;Need further instruction       PT Short Term Goals - 12/26/17 1815      PT SHORT TERM GOAL #1   Title  Patient will be independent in home exercise program to improve strength/mobility for better functional independence with ADLs.    Baseline  HEP given    Time  2    Period  Weeks    Status  New    Target Date  01/09/18      PT SHORT TERM GOAL #2   Title  Patient will report a worst pain of 7/10 on VAS in  R shoulder to improve tolerance with ADLs and reduced symptoms with activities.     Baseline  8/8: 9/10     Time  2    Period  Weeks    Status  New    Target Date  01/09/18        PT Long Term Goals - 12/26/17 1816      PT LONG TERM GOAL #1   Title  Patient will report a worst pain of 5/10 on VAS in    R shoulder         to improve tolerance with ADLs and reduced symptoms with activities.     Baseline  8/8: 9/10     Time  4    Period  Weeks    Status  New    Target Date  01/23/18      PT LONG TERM GOAL #2   Title  Patient will improve shoulder AROM to > 140 degrees of flexion, scaption, and abduction for improved ability to perform overhead activities.    Baseline  8/8: AROM standing: flexion 95, abduction 99    Time  4    Period  Weeks     Status  New    Target Date  01/23/18      PT LONG TERM GOAL #3   Title  Patient will decrease Quick DASH score by >  8 points (gross 78%, work 92%) demonstrating reduced self-reported upper extremity disability.    Baseline  8/8: 86% work 100%    Time  4    Period  Weeks    Status  New    Target Date  01/23/18      PT LONG TERM GOAL #4   Title  Patient will demonstrate 4/5 strength in RUE to allow patient to return to work and perform job duties without compensations.     Baseline  8/8: strength limited by ROM and pain    Time  4    Period  Weeks    Status  New    Target Date  01/23/18            Plan - 12/31/17 1736    Clinical Impression Statement  Patient tolerated therapy session well. Pt required demonstration for all exercises during therapy session. Pt performed warm up with UE ranger seated edge of mat by writing out the alphabet with R hand. Pt attempted AAROM supine with cane in flexion but unable to perform due to inability to deactivate shoulder muscles. Pt performed AAROM supine using L arm to lift R arm to about 100 deg; instructed to avoid painful ROM. Pt performed AAROM supine using cane for ER with VCs for proper arm placement. Pt attempted AAROM abduction supine with cane but was unable due to pain and muscle guarding. Pt performed horizontal ADD/ABD with UE ranger seated edge of mat table x1 min bouts x2 bouts with VCs to avoid painful ROM. PT assessed scars for scar adhesion; increased adhesion noted at most anterior scar along bicep and superior scar along humeral head. Pt performed isometrics of flexion, extension, ER/IR, and abduction with VCs for proper form and technique. Pt will continue to benefit from skilled PT intervention for improvements in shoulder ROM, strength, and decreased pain.     Rehab Potential  Fair    Clinical Impairments Affecting Rehab Potential  (+) age, educational level (-) secondary injury     PT Frequency  2x / week    PT Duration  4  weeks    PT Treatment/Interventions  ADLs/Self Care Home Management;Aquatic Therapy;Cryotherapy;Electrical Stimulation;Parrafin;Ultrasound;Traction;Moist Heat;Iontophoresis 4mg /ml Dexamethasone;Functional mobility training;Therapeutic activities;Therapeutic exercise;Patient/family education;Neuromuscular re-education;Manual techniques;Manual lymph drainage;Compression bandaging;Taping;Energy conservation;Dry needling;Passive range of motion;Scar mobilization    PT Next Visit Plan  review HEP, abduction PROM, introduce AAROM, ice cup massage    PT Home Exercise Plan  see sheet    Consulted and Agree with Plan of Care  Patient       Patient will benefit from skilled therapeutic intervention in order to improve the following deficits and impairments:  Decreased activity tolerance, Decreased coordination, Decreased endurance, Decreased mobility, Decreased range of motion, Decreased strength, Decreased scar mobility, Hypomobility, Increased edema, Impaired flexibility, Impaired perceived functional ability, Increased muscle spasms, Impaired UE functional use, Postural dysfunction, Improper body mechanics, Pain  Visit Diagnosis: Acute pain of right shoulder  Muscle weakness (generalized)     Problem List There are no active problems to display for this patient.  Harriet Masson, SPT This entire session was performed under direct supervision and direction of a licensed therapist/therapist assistant . I have personally read, edited and approve of the note as written.  Trotter,Margaret PT, DPT 01/01/2018, 8:34 AM  [place] MAIN [facility] SERVICES [address] [place], [place], [address] Phone: [phone]   Fax:  [phone]  Name: Charlene Daniels MRN: [id number] Date of Birth: 11/15/1970

## 2018-01-01 ENCOUNTER — Ambulatory Visit: Payer: 59

## 2018-01-01 DIAGNOSIS — M6281 Muscle weakness (generalized): Secondary | ICD-10-CM

## 2018-01-01 DIAGNOSIS — S46011A Strain of muscle(s) and tendon(s) of the rotator cuff of right shoulder, initial encounter: Secondary | ICD-10-CM | POA: Diagnosis not present

## 2018-01-01 DIAGNOSIS — M25511 Pain in right shoulder: Secondary | ICD-10-CM

## 2018-01-01 NOTE — Therapy (Signed)
[name] MAIN [facility] SERVICES [address] [place], [place], [address] Phone: [phone]   Fax:  [phone]  Physical Therapy Treatment  Patient Details  Name: Charlene Daniels MRN: [id number] Date of Birth: [birth year] No data recorded  Encounter Date: 01/01/2018  PT End of Session - 01/01/18 1745    Visit Number  3    Number of Visits  8    Date for PT Re-Evaluation  01/23/18    Authorization Type  3/10    PT Start Time  1645    PT Stop Time  1730    PT Time Calculation (min)  45 min    Activity Tolerance  Patient limited by pain;Patient tolerated treatment well    Behavior During Therapy  [facility] for tasks assessed/performed       Past Medical History:  Diagnosis Date  . Arthritis   . Chronic kidney disease    on meds to protect kidney from DM  . Diabetes mellitus without complication ([place])   . GERD (gastroesophageal reflux disease)   . Headache(784.0)   . PONV (postoperative nausea and vomiting)     Past Surgical History:  Procedure Laterality Date  . ABDOMINAL HYSTERECTOMY  2009  . CHOLECYSTECTOMY  1994  . DILATION AND CURETTAGE OF UTERUS  1994  . LYMPH NODE DISSECTION Right 1999   right neck cervical LN dissection after Cat-scratch fever  . SHOULDER ARTHROSCOPY WITH OPEN ROTATOR CUFF REPAIR Right 09/19/2017   Procedure: SHOULDER ARTHROSCOPY WITH OPEN ROTATOR CUFF REPAIR & SUBACROMINAL DECOMPRESSION.;  Surgeon: Thornton Park, MD;  Location: [facility];  Service: Orthopedics;  Laterality: Right;  . SHOULDER ARTHROSCOPY WITH ROTATOR CUFF REPAIR AND SUBACROMIAL DECOMPRESSION Left 02/12/2013   Procedure: LEFT SHOULDER ARTHROSCOPY WITH SUBACROMIAL DECOMPRESSION, DISTAL CLAVICLE RESECTION, POSSIBLE BICEP TENOTOMY, POSSIBLE ROTATOR CUFF REPAIR;  Surgeon: Marin Shutter, MD;  Location: [place];  Service: Orthopedics;  Laterality: Left;  . TONSILLECTOMY  1998    There were no vitals filed for this visit.  Subjective Assessment -  01/01/18 1648    Subjective  Patient reports pain has increased since yesterday. Pain ranges from 6-9 throughout the whole day. Slept in bed last night. Iced it which temperarily helped but not for long.     Pertinent History  atient was 8 weeks out right shoulder arthroscopy with mini open rotator cuff repair (performed 5/2)  when she moved her waist band feeling like "her arm had been ripped off". Since this moment on has not been out of pain. Went back to PT and her therapist felt like it had been retorn since she was not able to move arm. Mack Guise said MRI was false positive on tear, wanted pt to go to neurology thinking pain is from neck.  Was thinking it potentially could be complex regional pain syndrome. Patient wanted second opinion so went to [facility] in [place] to [employer]. Was not torn, anchor is lifted, where the repair is was not torn. Prior rotator cuff repair. Severe tendinosis of the supraspinatus tendon with a high-grade partial-thickness articular surface tear with a possible small full-thickness component. Severe tendinosis of the infraspinatus tendon.. Finished up steroid taper form Kransinski which helped with numb and ache.   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 Still not sleeping in bed , wants to get out of recliner. Has not had PT for 10 days. Patient has lymphedema on R  Side.     Limitations  Reading;Lifting;Sitting;Walking;House hold activities;Other (comment)  Diagnostic tests  MRI: Prior rotator cuff repair. Severe tendinosis of the    Patient Stated Goals  to reduce pain, return to work duties, improve ROM and strength     Currently in Pain?  Yes    Pain Score  7     Pain Location  Shoulder    Pain Orientation  Right    Pain Descriptors / Indicators  Aching    Pain Type  Acute pain;Surgical pain    Pain Onset  More than a month ago      Warm up with UE ranger seated with ranger on floor, perform alphabet with R arm  Standing:  Abduction wall walks RUE  with PT providing stabilization due to fear of arm falling 5x.  Flexion walk backs with hand on raised plinth 5x 10 second holds Abduction walk back with hand on raised plinth (AAROM + distraction) 10x 10 second holds (added to HEP)   AAROM supine with PVC pipe:  -Flexion 1x10 reps; requires 3 point hold, able to perform concentric motion, unable to relax for eccentric resulting in pain -ER 2x10 reps   Supine: Distraction with rhythmic rotation 60 seconds x 5 trials  Soft tissue assessment of subscap with tenderness to palpation x 2 minutes   Scar tissue cross-friction assessment: some increased adhesion noted at most anterior scar and superior lateral scar at head of humerus       Ice cup to R bicep and GH x 4 minutes  Pain reduced to 3/10                         PT Education - 01/01/18 1745    Education Details  AAROM, distraction, ice cup, manual, ther ex    Person(s) Educated  Patient    Methods  Explanation;Demonstration;Handout    Comprehension  Verbalized understanding;Returned demonstration       PT Short Term Goals - 12/26/17 1815      PT SHORT TERM GOAL #1   Title  Patient will be independent in home exercise program to improve strength/mobility for better functional independence with ADLs.    Baseline  HEP given    Time  2    Period  Weeks    Status  New    Target Date  01/09/18      PT SHORT TERM GOAL #2   Title  Patient will report a worst pain of 7/10 on VAS in  R shoulder to improve tolerance with ADLs and reduced symptoms with activities.     Baseline  8/8: 9/10     Time  2    Period  Weeks    Status  New    Target Date  01/09/18        PT Long Term Goals - 12/26/17 1816      PT LONG TERM GOAL #1   Title  Patient will report a worst pain of 5/10 on VAS in    R shoulder         to improve tolerance with ADLs and reduced symptoms with activities.     Baseline  8/8: 9/10     Time  4    Period  Weeks    Status  New    Target  Date  01/23/18      PT LONG TERM GOAL #2   Title  Patient will improve shoulder AROM to > 140 degrees of flexion, scaption, and abduction for improved ability to perform  overhead activities.    Baseline  8/8: AROM standing: flexion 95, abduction 99    Time  4    Period  Weeks    Status  New    Target Date  01/23/18      PT LONG TERM GOAL #3   Title  Patient will decrease Quick DASH score by > 8 points (gross 78%, work 92%) demonstrating reduced self-reported upper extremity disability.    Baseline  8/8: 86% work 100%    Time  4    Period  Weeks    Status  New    Target Date  01/23/18      PT LONG TERM GOAL #4   Title  Patient will demonstrate 4/5 strength in RUE to allow patient to return to work and perform job duties without compensations.     Baseline  8/8: strength limited by ROM and pain    Time  4    Period  Weeks    Status  New    Target Date  01/23/18            Plan - 01/01/18 1746    Clinical Impression Statement  Patient presents with increased muscle guarding and pain that was relieved with manual combined with therex from 7/10 to 3/10 by end of session. Distraction with rhythmic rotation reduced pain and tension within joint allowing for improved mobility. Modified AAROM utilizing plinth table performed successfully. Pt will continue to benefit from skilled PT intervention for improvements in shoulder ROM, strength, and decreased pain.     Rehab Potential  Fair    Clinical Impairments Affecting Rehab Potential  (+) age, educational level (-) secondary injury     PT Frequency  2x / week    PT Duration  4 weeks    PT Treatment/Interventions  ADLs/Self Care Home Management;Aquatic Therapy;Cryotherapy;Electrical Stimulation;Parrafin;Ultrasound;Traction;Moist Heat;Iontophoresis 4mg /ml Dexamethasone;Functional mobility training;Therapeutic activities;Therapeutic exercise;Patient/family education;Neuromuscular re-education;Manual techniques;Manual lymph  drainage;Compression bandaging;Taping;Energy conservation;Dry needling;Passive range of motion;Scar mobilization    PT Next Visit Plan  review HEP, abduction PROM, introduce AAROM, ice cup massage    PT Home Exercise Plan  see sheet    Consulted and Agree with Plan of Care  Patient       Patient will benefit from skilled therapeutic intervention in order to improve the following deficits and impairments:  Decreased activity tolerance, Decreased coordination, Decreased endurance, Decreased mobility, Decreased range of motion, Decreased strength, Decreased scar mobility, Hypomobility, Increased edema, Impaired flexibility, Impaired perceived functional ability, Increased muscle spasms, Impaired UE functional use, Postural dysfunction, Improper body mechanics, Pain  Visit Diagnosis: Acute pain of right shoulder  Muscle weakness (generalized)     Problem List There are no active problems to display for this patient.  Janna Arch, PT, DPT   01/01/2018, 5:47 PM  [place] MAIN [facility] SERVICES [address] [place], [place], [address] Phone: [phone]   Fax:  [phone]  Name: Charlene Daniels MRN: [id number] Date of Birth: 14-Dec-1970

## 2018-01-02 DIAGNOSIS — E119 Type 2 diabetes mellitus without complications: Secondary | ICD-10-CM | POA: Diagnosis not present

## 2018-01-03 ENCOUNTER — Ambulatory Visit (INDEPENDENT_AMBULATORY_CARE_PROVIDER_SITE_OTHER): Payer: Self-pay | Admitting: Family Medicine

## 2018-01-03 ENCOUNTER — Encounter: Payer: Self-pay | Admitting: Family Medicine

## 2018-01-03 VITALS — BP 126/80 | HR 77 | Temp 98.4°F | Wt 281.0 lb

## 2018-01-03 DIAGNOSIS — H6122 Impacted cerumen, left ear: Secondary | ICD-10-CM

## 2018-01-03 MED ORDER — AMOXICILLIN 875 MG PO TABS: 875.0000 mg | ORAL_TABLET | Freq: Two times a day (BID) | ORAL | 0 refills | Status: DC

## 2018-01-03 NOTE — Progress Notes (Addendum)
Patient ID: [name], female    DOB: 28-Mar-1971, 47 y.o.   MRN: [id number]  PCP: Rusty Aus, MD  No chief complaint on file.   Subjective:  HPI Charlene Daniels is a 47 y.o. female presents for evaluation acute onset left ear pain x today.  Symptoms included sharp pain only. Denies URI symptoms, fever, itching, drainage, or diminished hearing. Relief has not been attempted with OTC. Patient denies any other issues today. Social History   Socioeconomic History  . Marital status: Married    Spouse name: Not on file  . Number of children: Not on file  . Years of education: Not on file  . Highest education level: Not on file  Occupational History  . Not on file  Social Needs  . Financial resource strain: Not on file  . Food insecurity:    Worry: Not on file    Inability: Not on file  . Transportation needs:    Medical: Not on file    Non-medical: Not on file  Tobacco Use  . Smoking status: Never Smoker  . Smokeless tobacco: Never Used  Substance and Sexual Activity  . Alcohol use: No  . Drug use: No  . Sexual activity: Not on file  Lifestyle  . Physical activity:    Days per week: Not on file    Minutes per session: Not on file  . Stress: Not on file  Relationships  . Social connections:    Talks on phone: Not on file    Gets together: Not on file    Attends religious service: Not on file    Active member of club or organization: Not on file    Attends meetings of clubs or organizations: Not on file    Relationship status: Not on file  . Intimate partner violence:    Fear of current or ex partner: Not on file    Emotionally abused: Not on file    Physically abused: Not on file    Forced sexual activity: Not on file  Other Topics Concern  . Not on file  Social History Narrative  . Not on file    Family History  Problem Relation Age of Onset  . Ovarian cancer Mother 58  . Esophageal cancer Father 61  . Breast cancer Maternal Aunt 50   Review of  Systems Pertinent negatives listed in HPI There are no active problems to display for this patient.  Allergies  Allergen Reactions  . Darvocet [Propoxyphene N-Acetaminophen] Nausea Only and Other (See Comments)    headache  . Ultram [Tramadol] Other (See Comments)    hallucinations  . Sulfa Antibiotics Rash    Rash on wrist    Prior to Admission medications   Medication Sig Start Date End Date Taking? Authorizing Provider  acetaminophen (TYLENOL) 500 MG tablet Take 1,000 mg by mouth 2 (two) times daily as needed (for pain.).   Yes [provider]  Ascorbic Acid (VITAMIN C PO) Take 1 tablet by mouth at bedtime.   Yes [provider]  aspirin EC 81 MG tablet Take 81 mg by mouth at bedtime.   Yes [provider]  cholecalciferol (VITAMIN D) 1000 units tablet Take 1,000 Units by mouth at bedtime.   Yes [provider]  Dulaglutide (TRULICITY) 5.63 OV/5.6EP SOPN Inject 0.75 mg into the skin once a week.   Yes [provider]  estradiol (ESTRACE) 2 MG tablet Take 1 mg by mouth at bedtime. 08/06/17  Yes  [provider]  Garlic 1749 MG CAPS Take 1,000 mg by mouth at bedtime.   Yes [provider]  glimepiride (AMARYL) 2 MG tablet Take 2 mg by mouth daily at 12 noon.  02/06/17  Yes [provider]  ibuprofen (ADVIL,MOTRIN) 200 MG tablet Take 800 mg by mouth 2 (two) times daily as needed (for pain.).   Yes [provider]  KRILL OIL PO Take 1 capsule by mouth at bedtime.   Yes [provider]  lisinopril-hydrochlorothiazide (PRINZIDE,ZESTORETIC) 20-12.5 MG per tablet Take 0.5 tablets by mouth at bedtime.    Yes [provider]  metFORMIN (GLUCOPHAGE) 1000 MG tablet Take 1,000 mg by mouth 2 (two) times daily with a meal.   Yes [provider]  pantoprazole (PROTONIX) 40 MG tablet Take 40 mg by mouth at bedtime.    Yes [provider]  Probiotic Product (PROBIOTIC PO) Take 1 capsule by  mouth at bedtime.   Yes [provider]  spironolactone (ALDACTONE) 50 MG tablet Take 50 mg by mouth at bedtime.    Yes [provider]  vitamin B-12 (CYANOCOBALAMIN) 1000 MCG tablet Take 1,000 mcg by mouth at bedtime.   Yes [provider]  ondansetron (ZOFRAN) 4 MG tablet Take 1 tablet (4 mg total) by mouth every 8 (eight) hours as needed for nausea or vomiting. Patient not taking: Reported on 01/03/2018 09/19/17   Thornton Park, MD  OVER THE COUNTER MEDICATION Take 5 drops by mouth 2 (two) times daily. CBD OIL    [provider]  oxyCODONE (OXY IR/ROXICODONE) 5 MG immediate release tablet Take 1 tablet (5 mg total) by mouth every 4 (four) hours as needed for moderate pain or severe pain. Patient not taking: Reported on 01/03/2018 09/19/17   Thornton Park, MD   Past Medical, Surgical Family and Social History reviewed and updated.   Objective:   Today's Vitals   01/03/18 1230  BP: 126/80  Pulse: 77  Temp: 98.4 F (36.9 C)  SpO2: 98%  Weight: 281 lb (127.5 kg)    Wt Readings from Last 3 Encounters:  01/03/18 281 lb (127.5 kg)  09/19/17 276 lb (125.2 kg)  09/13/17 276 lb (125.2 kg)   Physical Exam  HENT:  Right Ear: Hearing, external ear and ear canal normal. No drainage, swelling or tenderness. Tympanic membrane is erythematous.  Left Ear: Hearing normal.  Left ear impacted with cerumen.    Assessment & Plan:  Left ear impacted cerumen--Irrigation of left ear performed. Patient did not tolerate and experienced pain 5/10. She opted to discontinue irrigation. Unable to visualize TM or entire ear canal. Offered patient short course antibiotic therapy for treatment of presumptive otitis media. She initially declined. Phoned office and now would like antibiotic. She has a  follow-up with ENT in 1 week.   Meds ordered this encounter  Medications  . amoxicillin (AMOXIL) 875 MG tablet    Sig: Take 1 tablet (875 mg total) by mouth 2 (two) times  daily.    Dispense:  20 tablet    Refill:  0      If symptoms worsen or do not improve, return for follow-up, follow-up with PCP, or at the emergency department if severity of symptoms warrant a higher level of care.     Carroll Sage. Kenton Kingfisher, MSN, FNP-C [facility]  [place] [place], [place] [address] [phone]

## 2018-01-03 NOTE — Addendum Note (Signed)
Addended by: Scot Jun on: 01/03/2018 02:31 PM   Modules accepted: Orders

## 2018-01-06 ENCOUNTER — Telehealth: Payer: Self-pay | Admitting: Emergency Medicine

## 2018-01-06 NOTE — Telephone Encounter (Signed)
Left message for follow up call from [facility] visit 

## 2018-01-08 ENCOUNTER — Ambulatory Visit: Payer: 59

## 2018-01-08 DIAGNOSIS — M6281 Muscle weakness (generalized): Secondary | ICD-10-CM | POA: Diagnosis not present

## 2018-01-08 DIAGNOSIS — M25511 Pain in right shoulder: Secondary | ICD-10-CM

## 2018-01-08 DIAGNOSIS — S46011A Strain of muscle(s) and tendon(s) of the rotator cuff of right shoulder, initial encounter: Secondary | ICD-10-CM | POA: Diagnosis not present

## 2018-01-08 NOTE — Therapy (Signed)
[place] MAIN [facility] SERVICES [address] [place], [place], [address] Phone: [phone]   Fax:  [phone]  Physical Therapy Treatment  Patient Details  Name: Charlene Daniels MRN: [id number] Date of Birth: 07-25-1970 No data recorded  Encounter Date: 01/08/2018  PT End of Session - 01/08/18 1738    Visit Number  4    Number of Visits  8    Date for PT Re-Evaluation  01/23/18    Authorization Type  4/10    PT Start Time  5102    PT Stop Time  1732    PT Time Calculation (min)  46 min    Activity Tolerance  Patient limited by pain;Patient tolerated treatment well    Behavior During Therapy  [facility] for tasks assessed/performed       Past Medical History:  Diagnosis Date  . Arthritis   . Chronic kidney disease    on meds to protect kidney from DM  . Diabetes mellitus without complication ([place])   . GERD (gastroesophageal reflux disease)   . Headache(784.0)   . PONV (postoperative nausea and vomiting)     Past Surgical History:  Procedure Laterality Date  . ABDOMINAL HYSTERECTOMY  2009  . CHOLECYSTECTOMY  1994  . DILATION AND CURETTAGE OF UTERUS  1994  . LYMPH NODE DISSECTION Right 1999   right neck cervical LN dissection after Cat-scratch fever  . SHOULDER ARTHROSCOPY WITH OPEN ROTATOR CUFF REPAIR Right 09/19/2017   Procedure: SHOULDER ARTHROSCOPY WITH OPEN ROTATOR CUFF REPAIR & SUBACROMINAL DECOMPRESSION.;  Surgeon: Thornton Park, MD;  Location: [facility];  Service: Orthopedics;  Laterality: Right;  . SHOULDER ARTHROSCOPY WITH ROTATOR CUFF REPAIR AND SUBACROMIAL DECOMPRESSION Left 02/12/2013   Procedure: LEFT SHOULDER ARTHROSCOPY WITH SUBACROMIAL DECOMPRESSION, DISTAL CLAVICLE RESECTION, POSSIBLE BICEP TENOTOMY, POSSIBLE ROTATOR CUFF REPAIR;  Surgeon: Marin Shutter, MD;  Location: [place];  Service: Orthopedics;  Laterality: Left;  . TONSILLECTOMY  1998    There were no vitals filed for this visit.  Subjective Assessment -  01/08/18 1653    Subjective  Patient saw Dr. Redmond Pulling who is pleased with his progress. Reports that her reaching back is most painful and limited. Patient reports she got her cortisone shot today and reports continued difficulty with extension.     Pertinent History  atient was 8 weeks out right shoulder arthroscopy with mini open rotator cuff repair (performed 5/2)  when she moved her waist band feeling like "her arm had been ripped off". Since this moment on has not been out of pain. Went back to PT and her therapist felt like it had been retorn since she was not able to move arm. Mack Guise said MRI was false positive on tear, wanted pt to go to neurology thinking pain is from neck.  Was thinking it potentially could be complex regional pain syndrome. Patient wanted second opinion so went to [facility] in [place] to [employer]. Was not torn, anchor is lifted, where the repair is was not torn. Prior rotator cuff repair. Severe tendinosis of the supraspinatus tendon with a high-grade partial-thickness articular surface tear with a possible small full-thickness component. Severe tendinosis of the infraspinatus tendon.. Finished up steroid taper form Kransinski which helped with numb and ache.   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 Has not had PT for 10 days. Patient has lymphedema on R  Side.     Limitations  Reading;Lifting;Sitting;Walking;House hold activities;Other (comment)    Diagnostic tests  MRI: Prior rotator cuff repair. Severe tendinosis of the    Patient Stated Goals  to reduce pain, return to work duties, improve ROM and strength     Currently in Pain?  Yes    Pain Score  6     Pain Location  Shoulder    Pain Orientation  Right    Pain Descriptors / Indicators  Aching    Pain Type  Acute pain;Surgical pain    Pain Onset  More than a month ago    Pain Frequency  Constant     Patient saw Dr. Redmond Pulling who is pleased with his  progress. Reports that her reaching back is most painful and limited.    Warm up with UE ranger seated with ranger on floor, perform alphabet with R arm   Standing: Flexion walk backs with hand on raised plinth105x 10 second holds Abduction walk back with hand on raised plinth (AAROM + distraction) 10x 10 second holds (added to HEP)   Seated: Hands on knees sliding back to hips  Shoulder rolls backwards 15x   Supine: Distraction with rhythmic rotation 60 seconds x 5 trials  Isometrics against PT hands: RUE: flexion, abduction, adduction, IR, ER 10x 3 second holds. Cueing for maintaining 40-60% muscle activation in correct alignment.      Ice cup to R bicep and GH x 4 minutes Pain reduced to 4/10                        PT Education - 01/08/18 1737    Education Details  exercise technique, manual, ice cup    Person(s) Educated  Patient    Methods  Explanation;Demonstration;Verbal cues    Comprehension  Verbalized understanding;Returned demonstration       PT Short Term Goals - 12/26/17 1815      PT SHORT TERM GOAL #1   Title  Patient will be independent in home exercise program to improve strength/mobility for better functional independence with ADLs.    Baseline  HEP given    Time  2    Period  Weeks    Status  New    Target Date  01/09/18      PT SHORT TERM GOAL #2   Title  Patient will report a worst pain of 7/10 on VAS in  R shoulder to improve tolerance with ADLs and reduced symptoms with activities.     Baseline  8/8: 9/10     Time  2    Period  Weeks    Status  New    Target Date  01/09/18        PT Long Term Goals - 12/26/17 1816      PT LONG TERM GOAL #1   Title  Patient will report a worst pain of 5/10 on VAS in    R shoulder         to improve tolerance with ADLs and reduced symptoms with activities.     Baseline  8/8: 9/10     Time  4    Period  Weeks    Status  New    Target Date  01/23/18      PT LONG TERM GOAL #2   Title   Patient will improve shoulder AROM to > 140 degrees of flexion, scaption, and abduction for improved ability to perform overhead activities.    Baseline  8/8: AROM standing: flexion 95,  abduction 99    Time  4    Period  Weeks    Status  New    Target Date  01/23/18      PT LONG TERM GOAL #3   Title  Patient will decrease Quick DASH score by > 8 points (gross 78%, work 92%) demonstrating reduced self-reported upper extremity disability.    Baseline  8/8: 86% work 100%    Time  4    Period  Weeks    Status  New    Target Date  01/23/18      PT LONG TERM GOAL #4   Title  Patient will demonstrate 4/5 strength in RUE to allow patient to return to work and perform job duties without compensations.     Baseline  8/8: strength limited by ROM and pain    Time  4    Period  Weeks    Status  New    Target Date  01/23/18            Plan - 01/08/18 1743    Clinical Impression Statement  Patient demonstrates improved AAROM movement with table walk backs, prolonged holds continue to challenge patient at this time.  Muscle activation is improving with decreased pain in isometrics. Postural interventions implemented with good body mechanics and following. Pt will continue to benefit from skilled PT intervention for improvements in shoulder ROM, strength, and decreased pain.    Rehab Potential  Fair    Clinical Impairments Affecting Rehab Potential  (+) age, educational level (-) secondary injury     PT Frequency  2x / week    PT Duration  4 weeks    PT Treatment/Interventions  ADLs/Self Care Home Management;Aquatic Therapy;Cryotherapy;Electrical Stimulation;Parrafin;Ultrasound;Traction;Moist Heat;Iontophoresis 4mg /ml Dexamethasone;Functional mobility training;Therapeutic activities;Therapeutic exercise;Patient/family education;Neuromuscular re-education;Manual techniques;Manual lymph drainage;Compression bandaging;Taping;Energy conservation;Dry needling;Passive range of motion;Scar mobilization     PT Next Visit Plan  review HEP, abduction PROM, introduce AAROM, ice cup massage    PT Home Exercise Plan  see sheet    Consulted and Agree with Plan of Care  Patient       Patient will benefit from skilled therapeutic intervention in order to improve the following deficits and impairments:  Decreased activity tolerance, Decreased coordination, Decreased endurance, Decreased mobility, Decreased range of motion, Decreased strength, Decreased scar mobility, Hypomobility, Increased edema, Impaired flexibility, Impaired perceived functional ability, Increased muscle spasms, Impaired UE functional use, Postural dysfunction, Improper body mechanics, Pain  Visit Diagnosis: Acute pain of right shoulder  Muscle weakness (generalized)     Problem List There are no active problems to display for this patient.  Janna Arch, PT, DPT   01/08/2018, 5:45 PM  [place] MAIN [facility] SERVICES [address] [place], [place], [address] Phone: [phone]   Fax:  [phone]  Name: Charlene Daniels MRN: [id number] Date of Birth: Nov 14, 1970

## 2018-01-09 ENCOUNTER — Other Ambulatory Visit: Payer: Self-pay | Admitting: Internal Medicine

## 2018-01-09 DIAGNOSIS — Z1231 Encounter for screening mammogram for malignant neoplasm of breast: Secondary | ICD-10-CM

## 2018-01-14 ENCOUNTER — Encounter: Payer: Self-pay | Admitting: Physical Therapy

## 2018-01-14 ENCOUNTER — Ambulatory Visit: Payer: 59 | Admitting: Physical Therapy

## 2018-01-14 DIAGNOSIS — M6281 Muscle weakness (generalized): Secondary | ICD-10-CM | POA: Diagnosis not present

## 2018-01-14 DIAGNOSIS — M25511 Pain in right shoulder: Secondary | ICD-10-CM | POA: Diagnosis not present

## 2018-01-14 DIAGNOSIS — S46011A Strain of muscle(s) and tendon(s) of the rotator cuff of right shoulder, initial encounter: Secondary | ICD-10-CM | POA: Diagnosis not present

## 2018-01-14 NOTE — Therapy (Signed)
[place] MAIN [facility] SERVICES [address] [place], [place], [address] Phone: [phone]   Fax:  [phone]  Physical Therapy Treatment  Patient Details  Name: Charlene Daniels MRN: [id number] Date of Birth: 1970/06/24 No data recorded  Encounter Date: 01/14/2018  PT End of Session - 01/14/18 1719    Visit Number  5    Number of Visits  8    Date for PT Re-Evaluation  01/23/18    Authorization Type  5/10    PT Start Time  0505    PT Stop Time  0530    PT Time Calculation (min)  25 min    Activity Tolerance  Patient limited by pain;Patient tolerated treatment well    Behavior During Therapy  [facility] for tasks assessed/performed       Past Medical History:  Diagnosis Date  . Arthritis   . Chronic kidney disease    on meds to protect kidney from DM  . Diabetes mellitus without complication ([place])   . GERD (gastroesophageal reflux disease)   . Headache(784.0)   . PONV (postoperative nausea and vomiting)     Past Surgical History:  Procedure Laterality Date  . ABDOMINAL HYSTERECTOMY  2009  . CHOLECYSTECTOMY  1994  . DILATION AND CURETTAGE OF UTERUS  1994  . LYMPH NODE DISSECTION Right 1999   right neck cervical LN dissection after Cat-scratch fever  . SHOULDER ARTHROSCOPY WITH OPEN ROTATOR CUFF REPAIR Right 09/19/2017   Procedure: SHOULDER ARTHROSCOPY WITH OPEN ROTATOR CUFF REPAIR & SUBACROMINAL DECOMPRESSION.;  Surgeon: Thornton Park, MD;  Location: [facility];  Service: Orthopedics;  Laterality: Right;  . SHOULDER ARTHROSCOPY WITH ROTATOR CUFF REPAIR AND SUBACROMIAL DECOMPRESSION Left 02/12/2013   Procedure: LEFT SHOULDER ARTHROSCOPY WITH SUBACROMIAL DECOMPRESSION, DISTAL CLAVICLE RESECTION, POSSIBLE BICEP TENOTOMY, POSSIBLE ROTATOR CUFF REPAIR;  Surgeon: Marin Shutter, MD;  Location: [place];  Service: Orthopedics;  Laterality: Left;  . TONSILLECTOMY  1998    There were no vitals filed for this visit.  Subjective Assessment -  01/14/18 1716    Subjective  Patient is having 5/10 pain to right shoulder, she needs to go back to work and is going to have a short session.     Pertinent History  atient was 8 weeks out right shoulder arthroscopy with mini open rotator cuff repair (performed 5/2)  when she moved her waist band feeling like "her arm had been ripped off". Since this moment on has not been out of pain. Went back to PT and her therapist felt like it had been retorn since she was not able to move arm. Mack Guise said MRI was false positive on tear, wanted pt to go to neurology thinking pain is from neck.  Was thinking it potentially could be complex regional pain syndrome. Patient wanted second opinion so went to [facility] in [place] to [employer]. Was not torn, anchor is lifted, where the repair is was not torn. Prior rotator cuff repair. Severe tendinosis of the supraspinatus tendon with a high-grade partial-thickness articular surface tear with a possible small full-thickness component. Severe tendinosis of the infraspinatus tendon.. Finished up steroid taper form Kransinski which helped with numb and ache.   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 Still not sleeping in bed , wants to get out of recliner. Has not had PT for 10 days. Patient has lymphedema on R  Side.     Limitations  Reading;Lifting;Sitting;Walking;House hold activities;Other (comment)    Diagnostic tests  MRI:  Prior rotator cuff repair. Severe tendinosis of the    Patient Stated Goals  to reduce pain, return to work duties, improve ROM and strength     Pain Score  5     Pain Location  Shoulder    Pain Orientation  Right    Pain Descriptors / Indicators  Aching    Pain Onset  More than a month ago    Aggravating Factors   AROM    Pain Relieving Factors  medicine, shot cortisone    Effect of Pain on Daily Activities  difficult to do activities       Treatment: Pulley x 5 mins with ROM aprox. 120 deg in scaption Finger ladder x 2  Seated arm  ranger ABCS x 1 Ball fwd flex x 10 Ball on table top horizontal abd/add x 10  Standing mirror with pillow case flex x 10  Patient is guarded and has pain limiting motion.  AROM 0-80 flex right shoulder standing prior to and following PT session.                        PT Education - 01/14/18 1718    Education Details  HEP     Person(s) Educated  Patient    Methods  Explanation;Verbal cues    Comprehension  Verbalized understanding;Verbal cues required       PT Short Term Goals - 12/26/17 1815      PT SHORT TERM GOAL #1   Title  Patient will be independent in home exercise program to improve strength/mobility for better functional independence with ADLs.    Baseline  HEP given    Time  2    Period  Weeks    Status  New    Target Date  01/09/18      PT SHORT TERM GOAL #2   Title  Patient will report a worst pain of 7/10 on VAS in  R shoulder to improve tolerance with ADLs and reduced symptoms with activities.     Baseline  8/8: 9/10     Time  2    Period  Weeks    Status  New    Target Date  01/09/18        PT Long Term Goals - 12/26/17 1816      PT LONG TERM GOAL #1   Title  Patient will report a worst pain of 5/10 on VAS in    R shoulder         to improve tolerance with ADLs and reduced symptoms with activities.     Baseline  8/8: 9/10     Time  4    Period  Weeks    Status  New    Target Date  01/23/18      PT LONG TERM GOAL #2   Title  Patient will improve shoulder AROM to > 140 degrees of flexion, scaption, and abduction for improved ability to perform overhead activities.    Baseline  8/8: AROM standing: flexion 95, abduction 99    Time  4    Period  Weeks    Status  New    Target Date  01/23/18      PT LONG TERM GOAL #3   Title  Patient will decrease Quick DASH score by > 8 points (gross 78%, work 92%) demonstrating reduced self-reported upper extremity disability.    Baseline  8/8: 86% work 100%    Time  4  Period  Weeks     Status  New    Target Date  01/23/18      PT LONG TERM GOAL #4   Title  Patient will demonstrate 4/5 strength in RUE to allow patient to return to work and perform job duties without compensations.     Baseline  8/8: strength limited by ROM and pain    Time  4    Period  Weeks    Status  New    Target Date  01/23/18            Plan - 01/14/18 1719    Clinical Impression Statement  Patient continues to have 5/10 pain to right UE with decreased AROM 80 deg flex and decreased RUE strenght -3/5 flex and abd. She has pain during RUE exercises today and is limited by pain . Her HEP was added to with pulley , pendulum exercise and mirror slide wiht pillowcase. Her pain increased during therapy and will continue to benefit from skilled PT to improve  RoM and strength.     Rehab Potential  Fair    Clinical Impairments Affecting Rehab Potential  (+) age, educational level (-) secondary injury     PT Frequency  2x / week    PT Duration  4 weeks    PT Treatment/Interventions  ADLs/Self Care Home Management;Aquatic Therapy;Cryotherapy;Electrical Stimulation;Parrafin;Ultrasound;Traction;Moist Heat;Iontophoresis 4mg /ml Dexamethasone;Functional mobility training;Therapeutic activities;Therapeutic exercise;Patient/family education;Neuromuscular re-education;Manual techniques;Manual lymph drainage;Compression bandaging;Taping;Energy conservation;Dry needling;Passive range of motion;Scar mobilization    PT Next Visit Plan  review HEP, abduction PROM, introduce AAROM, ice cup massage    PT Home Exercise Plan  see sheet    Consulted and Agree with Plan of Care  Patient       Patient will benefit from skilled therapeutic intervention in order to improve the following deficits and impairments:  Decreased activity tolerance, Decreased coordination, Decreased endurance, Decreased mobility, Decreased range of motion, Decreased strength, Decreased scar mobility, Hypomobility, Increased edema, Impaired  flexibility, Impaired perceived functional ability, Increased muscle spasms, Impaired UE functional use, Postural dysfunction, Improper body mechanics, Pain  Visit Diagnosis: Acute pain of right shoulder  Muscle weakness (generalized)     Problem List There are no active problems to display for this patient.   [address] , [place] DPT 01/14/2018, 5:34 PM  [place] MAIN [facility] SERVICES [address] [place], [place], [address] Phone: [phone]   Fax:  [phone]  Name: LEATHER ESTIS MRN: [id number] Date of Birth: Jul 31, 1970

## 2018-01-15 ENCOUNTER — Ambulatory Visit: Payer: 59

## 2018-01-15 DIAGNOSIS — M6281 Muscle weakness (generalized): Secondary | ICD-10-CM

## 2018-01-15 DIAGNOSIS — S46011A Strain of muscle(s) and tendon(s) of the rotator cuff of right shoulder, initial encounter: Secondary | ICD-10-CM | POA: Diagnosis not present

## 2018-01-15 DIAGNOSIS — M25511 Pain in right shoulder: Secondary | ICD-10-CM | POA: Diagnosis not present

## 2018-01-15 NOTE — Therapy (Signed)
[place] MAIN [facility] SERVICES [address] [place], [place], [address] Phone: [phone]   Fax:  [phone]  Physical Therapy Treatment  Patient Details  Name: Charlene Daniels MRN: [id number] Date of Birth: [birth year] No data recorded  Encounter Date: 01/15/2018  PT End of Session - 01/15/18 1734    Visit Number  6    Number of Visits  8    Date for PT Re-Evaluation  01/23/18    Authorization Type  6/10    PT Start Time  1645    PT Stop Time  5427    PT Time Calculation (min)  46 min    Activity Tolerance  Patient limited by pain;Patient tolerated treatment well    Behavior During Therapy  [facility] for tasks assessed/performed       Past Medical History:  Diagnosis Date  . Arthritis   . Chronic kidney disease    on meds to protect kidney from DM  . Diabetes mellitus without complication ([place])   . GERD (gastroesophageal reflux disease)   . Headache(784.0)   . PONV (postoperative nausea and vomiting)     Past Surgical History:  Procedure Laterality Date  . ABDOMINAL HYSTERECTOMY  2009  . CHOLECYSTECTOMY  1994  . DILATION AND CURETTAGE OF UTERUS  1994  . LYMPH NODE DISSECTION Right 1999   right neck cervical LN dissection after Cat-scratch fever  . SHOULDER ARTHROSCOPY WITH OPEN ROTATOR CUFF REPAIR Right 09/19/2017   Procedure: SHOULDER ARTHROSCOPY WITH OPEN ROTATOR CUFF REPAIR & SUBACROMINAL DECOMPRESSION.;  Surgeon: Thornton Park, MD;  Location: [facility];  Service: Orthopedics;  Laterality: Right;  . SHOULDER ARTHROSCOPY WITH ROTATOR CUFF REPAIR AND SUBACROMIAL DECOMPRESSION Left 02/12/2013   Procedure: LEFT SHOULDER ARTHROSCOPY WITH SUBACROMIAL DECOMPRESSION, DISTAL CLAVICLE RESECTION, POSSIBLE BICEP TENOTOMY, POSSIBLE ROTATOR CUFF REPAIR;  Surgeon: Marin Shutter, MD;  Location: [place];  Service: Orthopedics;  Laterality: Left;  . TONSILLECTOMY  1998    There were no vitals filed for this visit.  Subjective Assessment -  01/15/18 1648    Subjective  Patient reports feeling very sore earlier today with lunchtime being the worse. Reports compliance with HEP.     Pertinent History  atient was 8 weeks out right shoulder arthroscopy with mini open rotator cuff repair (performed 5/2)  when she moved her waist band feeling like "her arm had been ripped off". Since this moment on has not been out of pain. Went back to PT and her therapist felt like it had been retorn since she was not able to move arm. Mack Guise said MRI was false positive on tear, wanted pt to go to neurology thinking pain is from neck.  Was thinking it potentially could be complex regional pain syndrome. Patient wanted second opinion so went to [facility] in [place] to [employer]. Was not torn, anchor is lifted, where the repair is was not torn. Prior rotator cuff repair. Severe tendinosis of the supraspinatus tendon with a high-grade partial-thickness articular surface tear with a possible small full-thickness component. Severe tendinosis of the infraspinatus tendon.. Finished up steroid taper form Kransinski which helped with numb and ache.   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 Has not had PT for 10 days. Patient has lymphedema on R  Side.     Limitations  Reading;Lifting;Sitting;Walking;House hold activities;Other (comment)    Diagnostic tests  MRI: Prior rotator cuff repair. Severe tendinosis of  the    Patient Stated Goals  to reduce pain, return to work duties, improve ROM and strength     Currently in Pain?  Yes    Pain Score  4     Pain Location  Shoulder    Pain Orientation  Right    Pain Descriptors / Indicators  Aching    Pain Type  Acute pain;Surgical pain    Pain Onset  More than a month ago    Pain Frequency  Constant         Standing: Flexion walk backs with hand on swiss ball raised plinth 10x 10 second holds Abduction walk back with hand on raised plinth (AAROM +  distraction) on swiss ball. 10x 10 second holds (added to HEP)   Seated: Hands on knees sliding back to hips  Shoulder rolls backwards 15x  AAROM alphabet   Supine: Distraction with rhythmic rotation 60 seconds x 5 trials   Isometrics against PT hands: RUE: flexion, abduction, adduction, IR, ER 10x 3 second holds. Cueing for maintaining 40-60% muscle activation in correct alignment.      Ice cup to R bicep and GH x 4 minutes Pain reduced to 2/10                        PT Education - 01/15/18 1650    Education Details  exercise technique, manual     Person(s) Educated  Patient    Methods  Explanation;Demonstration;Verbal cues    Comprehension  Verbalized understanding;Returned demonstration       PT Short Term Goals - 12/26/17 1815      PT SHORT TERM GOAL #1   Title  Patient will be independent in home exercise program to improve strength/mobility for better functional independence with ADLs.    Baseline  HEP given    Time  2    Period  Weeks    Status  New    Target Date  01/09/18      PT SHORT TERM GOAL #2   Title  Patient will report a worst pain of 7/10 on VAS in  R shoulder to improve tolerance with ADLs and reduced symptoms with activities.     Baseline  8/8: 9/10     Time  2    Period  Weeks    Status  New    Target Date  01/09/18        PT Long Term Goals - 12/26/17 1816      PT LONG TERM GOAL #1   Title  Patient will report a worst pain of 5/10 on VAS in    R shoulder         to improve tolerance with ADLs and reduced symptoms with activities.     Baseline  8/8: 9/10     Time  4    Period  Weeks    Status  New    Target Date  01/23/18      PT LONG TERM GOAL #2   Title  Patient will improve shoulder AROM to > 140 degrees of flexion, scaption, and abduction for improved ability to perform overhead activities.    Baseline  8/8: AROM standing: flexion 95, abduction 99    Time  4    Period  Weeks    Status  New    Target Date   01/23/18      PT LONG TERM GOAL #3   Title  Patient will decrease Quick  DASH score by > 8 points (gross 78%, work 92%) demonstrating reduced self-reported upper extremity disability.    Baseline  8/8: 86% work 100%    Time  4    Period  Weeks    Status  New    Target Date  01/23/18      PT LONG TERM GOAL #4   Title  Patient will demonstrate 4/5 strength in RUE to allow patient to return to work and perform job duties without compensations.     Baseline  8/8: strength limited by ROM and pain    Time  4    Period  Weeks    Status  New    Target Date  01/23/18            Plan - 01/15/18 1740    Clinical Impression Statement  Patient presents with improved strength of isometric contraction against PT's arm in supine position. Patient continues to be challenged with active ROM due to pain limiting mobility. Her pain increased during therapy and will continue to benefit from skilled PT to improve RoM and strength.     Rehab Potential  Fair    Clinical Impairments Affecting Rehab Potential  (+) age, educational level (-) secondary injury     PT Frequency  2x / week    PT Duration  4 weeks    PT Treatment/Interventions  ADLs/Self Care Home Management;Aquatic Therapy;Cryotherapy;Electrical Stimulation;Parrafin;Ultrasound;Traction;Moist Heat;Iontophoresis 4mg /ml Dexamethasone;Functional mobility training;Therapeutic activities;Therapeutic exercise;Patient/family education;Neuromuscular re-education;Manual techniques;Manual lymph drainage;Compression bandaging;Taping;Energy conservation;Dry needling;Passive range of motion;Scar mobilization    PT Next Visit Plan  review HEP, abduction PROM, introduce AAROM, ice cup massage    PT Home Exercise Plan  see sheet    Consulted and Agree with Plan of Care  Patient       Patient will benefit from skilled therapeutic intervention in order to improve the following deficits and impairments:  Decreased activity tolerance, Decreased coordination,  Decreased endurance, Decreased mobility, Decreased range of motion, Decreased strength, Decreased scar mobility, Hypomobility, Increased edema, Impaired flexibility, Impaired perceived functional ability, Increased muscle spasms, Impaired UE functional use, Postural dysfunction, Improper body mechanics, Pain  Visit Diagnosis: Acute pain of right shoulder  Muscle weakness (generalized)     Problem List There are no active problems to display for this patient.  Janna Arch, PT, DPT   01/15/2018, 5:41 PM  [place] MAIN [facility] SERVICES [address] [place], [place], [address] Phone: [phone]   Fax:  [phone]  Name: Charlene Daniels MRN: [id number] Date of Birth: 12/29/1970

## 2018-01-21 ENCOUNTER — Ambulatory Visit: Payer: 59 | Admitting: Physical Therapy

## 2018-01-22 ENCOUNTER — Ambulatory Visit: Payer: 59 | Attending: Orthopedic Surgery

## 2018-01-22 DIAGNOSIS — M6281 Muscle weakness (generalized): Secondary | ICD-10-CM | POA: Insufficient documentation

## 2018-01-22 DIAGNOSIS — M25511 Pain in right shoulder: Secondary | ICD-10-CM | POA: Diagnosis not present

## 2018-01-22 NOTE — Therapy (Signed)
[place] MAIN [facility] SERVICES [address] [place], [place], [address] Phone: [phone]   Fax:  [phone]  Physical Therapy Treatment Physical Therapy Progress Note   Dates of reporting period  12/26/17   to   01/22/18  Patient Details  Name: Charlene Daniels MRN: [id number] Date of Birth: 1971/02/08 No data recorded  Encounter Date: 01/22/2018  PT End of Session - 01/23/18 1315    Visit Number  7    Number of Visits  16    Date for PT Re-Evaluation  02/19/18    Authorization Type  7/10 (next 1/10 starting 9/4)     PT Start Time  1730    PT Stop Time  1816    PT Time Calculation (min)  46 min    Activity Tolerance  Patient limited by pain;Patient tolerated treatment well    Behavior During Therapy  [facility] for tasks assessed/performed       Past Medical History:  Diagnosis Date  . Arthritis   . Chronic kidney disease    on meds to protect kidney from DM  . Diabetes mellitus without complication ([place])   . GERD (gastroesophageal reflux disease)   . Headache(784.0)   . PONV (postoperative nausea and vomiting)     Past Surgical History:  Procedure Laterality Date  . ABDOMINAL HYSTERECTOMY  2009  . CHOLECYSTECTOMY  1994  . DILATION AND CURETTAGE OF UTERUS  1994  . LYMPH NODE DISSECTION Right 1999   right neck cervical LN dissection after Cat-scratch fever  . SHOULDER ARTHROSCOPY WITH OPEN ROTATOR CUFF REPAIR Right 09/19/2017   Procedure: SHOULDER ARTHROSCOPY WITH OPEN ROTATOR CUFF REPAIR & SUBACROMINAL DECOMPRESSION.;  Surgeon: Thornton Park, MD;  Location: [facility];  Service: Orthopedics;  Laterality: Right;  . SHOULDER ARTHROSCOPY WITH ROTATOR CUFF REPAIR AND SUBACROMIAL DECOMPRESSION Left 02/12/2013   Procedure: LEFT SHOULDER ARTHROSCOPY WITH SUBACROMIAL DECOMPRESSION, DISTAL CLAVICLE RESECTION, POSSIBLE BICEP TENOTOMY, POSSIBLE ROTATOR CUFF REPAIR;  Surgeon: Marin Shutter, MD;  Location: [place];  Service: Orthopedics;  Laterality:  Left;  . TONSILLECTOMY  1998    There were no vitals filed for this visit.  Subjective Assessment - 01/22/18 1736    Subjective  Patient reports having a rough day. Is frustrated at slow progress of shoulder since surgery was performed last November. Is frustrated living with pain already and now has additional shoulder pain which she states " is driving her to the edge".     Pertinent History  atient was 8 weeks out right shoulder arthroscopy with mini open rotator cuff repair (performed 5/2)  when she moved her waist band feeling like "her arm had been ripped off". Since this moment on has not been out of pain. Went back to PT and her therapist felt like it had been retorn since she was not able to move arm. Mack Guise said MRI was false positive on tear, wanted pt to go to neurology thinking pain is from neck.  Was thinking it potentially could be complex regional pain syndrome. Patient wanted second opinion so went to [facility] in [place] to [employer]. Was not torn, anchor is lifted, where the repair is was not torn. Prior rotator cuff repair. Severe tendinosis of the supraspinatus tendon with a high-grade partial-thickness articular surface tear with a possible small full-thickness component. Severe tendinosis of the infraspinatus tendon.. Finished up steroid taper form Kransinski which helped with numb and ache.   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 Still not  sleeping in bed , wants to get out of recliner. Has not had PT for 10 days. Patient has lymphedema on R  Side.     Limitations  Reading;Lifting;Sitting;Walking;House hold activities;Other (comment)    Diagnostic tests  MRI: Prior rotator cuff repair. Severe tendinosis of the    Patient Stated Goals  to reduce pain, return to work duties, improve ROM and strength     Currently in Pain?  Yes    Pain Score  4     Pain Location  Shoulder    Pain Orientation  Right    Pain Descriptors / Indicators  Aching    Pain Type  Acute  pain;Surgical pain    Pain Onset  More than a month ago    Pain Frequency  Constant      Patient reports feeling very stiff in R shoulder that is worse at night waking her up at night. Patient is very tired and irritable today throughout session. Patient feels she can't deal with the pain.    VAS: 7-8/10 worst VAS AROM: standing Right Left  Shoulder Flexion 110 pain WFL  Shoulder Abduction 106 pain WFL     Quickdash= 56.8%; work 75% Strength  : 2+/5 due to pain   Seated: Hands on knees sliding back to hips  Shoulder rolls backwards 15x  AAROM alphabet    Supine: Distraction with rhythmic rotation 60 seconds x 5 trials PROM flexion 8x 10 second holds, abduction 8x 10 second holds, ER 8x 10 second holds, IR 8x 10 second holds Bicep lengthening 10x 8 second holds PNF1 with PT assistance x15 PNF 2 with PT assistance x 15  Ionto applied to R bicep with patient educated on wear time, when to take off. Side effects to be aware of and contra indications. Patient agreeable to attempt and verbalized understanding.    Patient's condition has the potential to improve in response to therapy. Maximum improvement is yet to be obtained. The anticipated improvement is attainable and reasonable in a generally predictable time.  Patient reports she is frustrated with living in pain. Is getting more movement however pain continues to dominate her life at this time.                        PT Education - 01/23/18 1315    Education Details  pain management for sleep     Person(s) Educated  Patient    Methods  Explanation    Comprehension  Verbalized understanding       PT Short Term Goals - 01/22/18 1737      PT SHORT TERM GOAL #1   Title  Patient will be independent in home exercise program to improve strength/mobility for better functional independence with ADLs.    Baseline  HEP given 9/4 compliant    Time  2    Period  Weeks    Status  Partially Met      PT SHORT  TERM GOAL #2   Title  Patient will report a worst pain of 7/10 on VAS in  R shoulder to improve tolerance with ADLs and reduced symptoms with activities.     Baseline  8/8: 9/10 9/4: 7-8/10     Time  2    Period  Weeks    Status  Partially Met        PT Long Term Goals - 01/22/18 1738      PT LONG TERM GOAL #1   Title  Patient will report a worst pain of 5/10 on VAS in    R shoulder         to improve tolerance with ADLs and reduced symptoms with activities.     Baseline  8/8: 9/10 9/4: 7-8/10     Time  4    Period  Weeks    Status  Partially Met    Target Date  02/19/18      PT LONG TERM GOAL #2   Title  Patient will improve shoulder AROM to > 140 degrees of flexion, scaption, and abduction for improved ability to perform overhead activities.    Baseline  8/8: AROM standing: flexion 95, abduction 99 9/4: flexion 110 painful  abduction: 106 painful    Time  4    Period  Weeks    Status  Partially Met    Target Date  02/19/18      PT LONG TERM GOAL #3   Title  Patient will decrease Quick DASH score by > 8 points (gross 78%, work 92%) demonstrating reduced self-reported upper extremity disability.    Baseline  8/8: 86% work 100% 9/5:  56.8%, work 75%    Time  4    Period  Weeks    Status  Achieved      PT LONG TERM GOAL #4   Title  Patient will demonstrate 4/5 strength in RUE to allow patient to return to work and perform job duties without compensations.     Baseline  8/8: strength limited by ROM and pain 9/4: abduction and flexion 2+/5 due to pain    Time  4    Period  Weeks    Status  On-going    Target Date  02/19/18      PT LONG TERM GOAL #5   Title  Patient will decrease Quick DASH score by > 8 points (gross 48.8%, work 67%) demonstrating reduced self-reported upper extremity disability.    Baseline  9/4: 56.8%, work 75%    Time  4    Period  Weeks    Status  New    Target Date  02/19/18            Plan - 01/23/18 1324    Clinical Impression Statement   Patient demonstrates progress towards all goals with improved active ROM, pain levels, QuickDash score and QuickDash work scores. Patient continues to have a pain centric mindset and is frustrated by pain levels and affect of pain on her sleep.Patient's condition has the potential to improve in response to therapy. Maximum improvement is yet to be obtained. The anticipated improvement is attainable and reasonable in a generally predictable time.  Patient will continue to benefit from skilled physical therapy for improvements in shoulder ROM, strength, and decreased pain.    Rehab Potential  Fair    Clinical Impairments Affecting Rehab Potential  (+) age, educational level (-) secondary injury     PT Frequency  2x / week    PT Duration  4 weeks    PT Treatment/Interventions  ADLs/Self Care Home Management;Aquatic Therapy;Cryotherapy;Electrical Stimulation;Parrafin;Ultrasound;Traction;Moist Heat;Iontophoresis 71m/ml Dexamethasone;Functional mobility training;Therapeutic activities;Therapeutic exercise;Patient/family education;Neuromuscular re-education;Manual techniques;Manual lymph drainage;Compression bandaging;Taping;Energy conservation;Dry needling;Passive range of motion;Scar mobilization    PT Next Visit Plan  review HEP, abduction PROM, introduce AAROM, ice cup massage    PT Home Exercise Plan  see sheet    Consulted and Agree with Plan of Care  Patient       Patient will benefit from skilled therapeutic intervention  in order to improve the following deficits and impairments:  Decreased activity tolerance, Decreased coordination, Decreased endurance, Decreased mobility, Decreased range of motion, Decreased strength, Decreased scar mobility, Hypomobility, Increased edema, Impaired flexibility, Impaired perceived functional ability, Increased muscle spasms, Impaired UE functional use, Postural dysfunction, Improper body mechanics, Pain  Visit Diagnosis: Acute pain of right shoulder  Muscle  weakness (generalized)     Problem List There are no active problems to display for this patient.  Janna Arch, PT, DPT   01/23/2018, 1:25 PM  [place] MAIN [facility] SERVICES [address] [place], [place], [address] Phone: [phone]   Fax:  [phone]  Name: TRICIA PLEDGER MRN: [id number] Date of Birth: 07-21-1970

## 2018-01-23 ENCOUNTER — Encounter: Payer: 59 | Admitting: Physical Therapy

## 2018-01-27 ENCOUNTER — Ambulatory Visit: Payer: 59

## 2018-01-27 DIAGNOSIS — M25511 Pain in right shoulder: Secondary | ICD-10-CM

## 2018-01-27 DIAGNOSIS — M6281 Muscle weakness (generalized): Secondary | ICD-10-CM | POA: Diagnosis not present

## 2018-01-27 NOTE — Therapy (Signed)
[place] MAIN [facility] SERVICES [address] [place], [place], [address] Phone: [phone]   Fax:  [phone]  Physical Therapy Treatment  Patient Details  Name: Charlene Daniels MRN: [id number] Date of Birth: 12-Jul-1970 No data recorded  Encounter Date: 01/27/2018  PT End of Session - 01/27/18 1808    Visit Number  8    Number of Visits  16    Date for PT Re-Evaluation  02/19/18    Authorization Type  1/10 (next 1/10 starting 9/4)     PT Start Time  1731    PT Stop Time  1815    PT Time Calculation (min)  44 min    Activity Tolerance  Patient limited by pain;Patient tolerated treatment well    Behavior During Therapy  [facility] for tasks assessed/performed       Past Medical History:  Diagnosis Date  . Arthritis   . Chronic kidney disease    on meds to protect kidney from DM  . Diabetes mellitus without complication ([place])   . GERD (gastroesophageal reflux disease)   . Headache(784.0)   . PONV (postoperative nausea and vomiting)     Past Surgical History:  Procedure Laterality Date  . ABDOMINAL HYSTERECTOMY  2009  . CHOLECYSTECTOMY  1994  . DILATION AND CURETTAGE OF UTERUS  1994  . LYMPH NODE DISSECTION Right 1999   right neck cervical LN dissection after Cat-scratch fever  . SHOULDER ARTHROSCOPY WITH OPEN ROTATOR CUFF REPAIR Right 09/19/2017   Procedure: SHOULDER ARTHROSCOPY WITH OPEN ROTATOR CUFF REPAIR & SUBACROMINAL DECOMPRESSION.;  Surgeon: Thornton Park, MD;  Location: [facility];  Service: Orthopedics;  Laterality: Right;  . SHOULDER ARTHROSCOPY WITH ROTATOR CUFF REPAIR AND SUBACROMIAL DECOMPRESSION Left 02/12/2013   Procedure: LEFT SHOULDER ARTHROSCOPY WITH SUBACROMIAL DECOMPRESSION, DISTAL CLAVICLE RESECTION, POSSIBLE BICEP TENOTOMY, POSSIBLE ROTATOR CUFF REPAIR;  Surgeon: Marin Shutter, MD;  Location: [place];  Service: Orthopedics;  Laterality: Left;  . TONSILLECTOMY  1998    There were no vitals filed for this  visit.  Subjective Assessment - 01/27/18 1806    Subjective  Patient reports having a bad weekend with intermittent bicep spasms throughout the weekend with nothing helping. Took muscle relaxers without support. Feels like R arm is swelling more.     Pertinent History  atient was 8 weeks out right shoulder arthroscopy with mini open rotator cuff repair (performed 5/2)  when she moved her waist band feeling like "her arm had been ripped off". Since this moment on has not been out of pain. Went back to PT and her therapist felt like it had been retorn since she was not able to move arm. Mack Guise said MRI was false positive on tear, wanted pt to go to neurology thinking pain is from neck.  Was thinking it potentially could be complex regional pain syndrome. Patient wanted second opinion so went to [facility] in [place] to [employer]. Was not torn, anchor is lifted, where the repair is was not torn. Prior rotator cuff repair. Severe tendinosis of the supraspinatus tendon with a high-grade partial-thickness articular surface tear with a possible small full-thickness component. Severe tendinosis of the infraspinatus tendon.. Finished up steroid taper form Kransinski which helped with numb and ache.   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 Still not sleeping in bed , wants to get out of recliner. Has not had PT for 10 days. Patient has lymphedema on R  Side.     Limitations  Reading;Lifting;Sitting;Walking;House  hold activities;Other (comment)    Diagnostic tests  MRI: Prior rotator cuff repair. Severe tendinosis of the    Patient Stated Goals  to reduce pain, return to work duties, improve ROM and strength     Currently in Pain?  Yes    Pain Score  4     Pain Location  Shoulder    Pain Orientation  Right    Pain Descriptors / Indicators  Aching    Pain Type  Acute pain;Surgical pain    Pain Onset  More than a month ago    Pain Frequency  Constant       Seated: Swiss ball flexion 10x 5 second  holds Swiss ball to L and R 10 x 5 second holds each direction   AAROM alphabet with UE ranger   Supine: Distraction with rhythmic rotation 60 seconds x 5 trials PROM flexion 8x 10 second holds, abduction 8x 10 second holds, ER 8x 10 second holds, IR 8x 10 second holds Bicep lengthening 10x 8 second holds PNF1 with PT assistance x15 PNF 2 with PT assistance x 15 Isometrics against PT hands: RUE: flexion, abduction, adduction, IR, ER 10x 3 second holds. Cueing for maintaining 40-60% muscle activation in correct alignment.                         PT Education - 01/27/18 1808    Education Details  exercise technique     Person(s) Educated  Patient    Methods  Explanation;Demonstration;Verbal cues    Comprehension  Verbalized understanding;Returned demonstration;Verbal cues required       PT Short Term Goals - 01/22/18 1737      PT SHORT TERM GOAL #1   Title  Patient will be independent in home exercise program to improve strength/mobility for better functional independence with ADLs.    Baseline  HEP given 9/4 compliant    Time  2    Period  Weeks    Status  Partially Met      PT SHORT TERM GOAL #2   Title  Patient will report a worst pain of 7/10 on VAS in  R shoulder to improve tolerance with ADLs and reduced symptoms with activities.     Baseline  8/8: 9/10 9/4: 7-8/10     Time  2    Period  Weeks    Status  Partially Met        PT Long Term Goals - 01/22/18 1738      PT LONG TERM GOAL #1   Title  Patient will report a worst pain of 5/10 on VAS in    R shoulder         to improve tolerance with ADLs and reduced symptoms with activities.     Baseline  8/8: 9/10 9/4: 7-8/10     Time  4    Period  Weeks    Status  Partially Met    Target Date  02/19/18      PT LONG TERM GOAL #2   Title  Patient will improve shoulder AROM to > 140 degrees of flexion, scaption, and abduction for improved ability to perform overhead activities.    Baseline  8/8: AROM  standing: flexion 95, abduction 99 9/4: flexion 110 painful  abduction: 106 painful    Time  4    Period  Weeks    Status  Partially Met    Target Date  02/19/18  PT LONG TERM GOAL #3   Title  Patient will decrease Quick DASH score by > 8 points (gross 78%, work 92%) demonstrating reduced self-reported upper extremity disability.    Baseline  8/8: 86% work 100% 9/5:  56.8%, work 75%    Time  4    Period  Weeks    Status  Achieved      PT LONG TERM GOAL #4   Title  Patient will demonstrate 4/5 strength in RUE to allow patient to return to work and perform job duties without compensations.     Baseline  8/8: strength limited by ROM and pain 9/4: abduction and flexion 2+/5 due to pain    Time  4    Period  Weeks    Status  On-going    Target Date  02/19/18      PT LONG TERM GOAL #5   Title  Patient will decrease Quick DASH score by > 8 points (gross 48.8%, work 67%) demonstrating reduced self-reported upper extremity disability.    Baseline  9/4: 56.8%, work 75%    Time  4    Period  Weeks    Status  New    Target Date  02/19/18            Plan - 01/27/18 1811    Clinical Impression Statement  Patient demonstrated increased guarding and swelilng of R bicep that improved after therex and manual. Patient challenged with allowing muscle to relax due to fear of pain with movement. Patient will continue to benefit from skilled physical therapy for improvements in shoulder ROM, strength, and decreased pain.    Rehab Potential  Fair    Clinical Impairments Affecting Rehab Potential  (+) age, educational level (-) secondary injury     PT Frequency  2x / week    PT Duration  4 weeks    PT Treatment/Interventions  ADLs/Self Care Home Management;Aquatic Therapy;Cryotherapy;Electrical Stimulation;Parrafin;Ultrasound;Traction;Moist Heat;Iontophoresis 64m/ml Dexamethasone;Functional mobility training;Therapeutic activities;Therapeutic exercise;Patient/family education;Neuromuscular  re-education;Manual techniques;Manual lymph drainage;Compression bandaging;Taping;Energy conservation;Dry needling;Passive range of motion;Scar mobilization    PT Next Visit Plan  review HEP, abduction PROM, introduce AAROM, ice cup massage    PT Home Exercise Plan  see sheet    Consulted and Agree with Plan of Care  Patient       Patient will benefit from skilled therapeutic intervention in order to improve the following deficits and impairments:  Decreased activity tolerance, Decreased coordination, Decreased endurance, Decreased mobility, Decreased range of motion, Decreased strength, Decreased scar mobility, Hypomobility, Increased edema, Impaired flexibility, Impaired perceived functional ability, Increased muscle spasms, Impaired UE functional use, Postural dysfunction, Improper body mechanics, Pain  Visit Diagnosis: Acute pain of right shoulder  Muscle weakness (generalized)     Problem List There are no active problems to display for this patient.  MJanna Arch PT, DPT   01/27/2018, 6:16 PM  [place] [facility] CenterSERVICES [address]RPueblito [place] [address]Phone: [phone]  Fax:  [phone] Name: Charlene ESPIRITUMRN: [id number] of Birth: 1Aug 13, 1972

## 2018-01-28 ENCOUNTER — Encounter: Payer: 59 | Admitting: Physical Therapy

## 2018-01-29 ENCOUNTER — Ambulatory Visit: Payer: 59

## 2018-01-30 ENCOUNTER — Encounter: Payer: 59 | Admitting: Physical Therapy

## 2018-02-03 ENCOUNTER — Ambulatory Visit: Payer: 59

## 2018-02-03 DIAGNOSIS — M25511 Pain in right shoulder: Secondary | ICD-10-CM | POA: Diagnosis not present

## 2018-02-03 DIAGNOSIS — M6281 Muscle weakness (generalized): Secondary | ICD-10-CM

## 2018-02-03 NOTE — Therapy (Signed)
[place] MAIN [facility] SERVICES [address] [place], [place], [address] Phone: [phone]   Fax:  [phone]  Physical Therapy Treatment  Patient Details  Name: Charlene Daniels MRN: [id number] Date of Birth: 1971-02-15 No data recorded  Encounter Date: 02/03/2018  PT End of Session - 02/03/18 1733    Visit Number  9    Number of Visits  16    Date for PT Re-Evaluation  02/19/18    Authorization Type  2/10  starting 9/4)     PT Start Time  1726    PT Stop Time  9758    PT Time Calculation (min)  47 min    Activity Tolerance  Patient limited by pain;Patient tolerated treatment well    Behavior During Therapy  [facility] for tasks assessed/performed       Past Medical History:  Diagnosis Date  . Arthritis   . Chronic kidney disease    on meds to protect kidney from DM  . Diabetes mellitus without complication ([place])   . GERD (gastroesophageal reflux disease)   . Headache(784.0)   . PONV (postoperative nausea and vomiting)     Past Surgical History:  Procedure Laterality Date  . ABDOMINAL HYSTERECTOMY  2009  . CHOLECYSTECTOMY  1994  . DILATION AND CURETTAGE OF UTERUS  1994  . LYMPH NODE DISSECTION Right 1999   right neck cervical LN dissection after Cat-scratch fever  . SHOULDER ARTHROSCOPY WITH OPEN ROTATOR CUFF REPAIR Right 09/19/2017   Procedure: SHOULDER ARTHROSCOPY WITH OPEN ROTATOR CUFF REPAIR & SUBACROMINAL DECOMPRESSION.;  Surgeon: Thornton Park, MD;  Location: [facility];  Service: Orthopedics;  Laterality: Right;  . SHOULDER ARTHROSCOPY WITH ROTATOR CUFF REPAIR AND SUBACROMIAL DECOMPRESSION Left 02/12/2013   Procedure: LEFT SHOULDER ARTHROSCOPY WITH SUBACROMIAL DECOMPRESSION, DISTAL CLAVICLE RESECTION, POSSIBLE BICEP TENOTOMY, POSSIBLE ROTATOR CUFF REPAIR;  Surgeon: Marin Shutter, MD;  Location: [facility];  Service: Orthopedics;  Laterality: Left;  . TONSILLECTOMY  1998    There were no vitals filed for this visit.  Subjective  Assessment - 02/03/18 1729    Subjective  Patient reports worst pain was 6/10 this week, had massage last weekend that improved pain levels. This helped her sleep.     Pertinent History  atient was 8 weeks out right shoulder arthroscopy with mini open rotator cuff repair (performed 5/2)  when she moved her waist band feeling like "her arm had been ripped off". Since this moment on has not been out of pain. Went back to PT and her therapist felt like it had been retorn since she was not able to move arm. Mack Guise said MRI was false positive on tear, wanted pt to go to neurology thinking pain is from neck.  Was thinking it potentially could be complex regional pain syndrome. Patient wanted second opinion so went to [facility] in [place] to [employer]. Was not torn, anchor is lifted, where the repair is was not torn. Prior rotator cuff repair. Severe tendinosis of the supraspinatus tendon with a high-grade partial-thickness articular surface tear with a possible small full-thickness component. Severe tendinosis of the infraspinatus tendon.. Finished up steroid taper form Kransinski which helped with numb and ache.   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 Still not sleeping in bed , wants to get out of recliner. Has not had PT for 10 days. Patient has lymphedema on R  Side.     Limitations  Reading;Lifting;Sitting;Walking;House hold activities;Other (comment)    Diagnostic tests  MRI: Prior rotator cuff repair. Severe tendinosis of the    Patient Stated Goals  to reduce pain, return to work duties, improve ROM and strength     Currently in Pain?  Yes    Pain Score  3     Pain Location  Shoulder    Pain Orientation  Right    Pain Descriptors / Indicators  Aching    Pain Type  Acute pain;Surgical pain    Pain Onset  More than a month ago    Pain Frequency  Constant       Seated: Scapular retractions via bringing arms from needs to hips to increase ROM and upright posture x 20  AAROM alphabet with  UE ranger AAROM circles 10x clockwise, 10x counterclockwise with ranger   Supine: Distraction with rhythmic rotation 60 seconds x 5 trials PROM flexion 8x 10 second holds, abduction 8x 10 second holds, ER 8x 10 second holds, IR 8x 10 second holds Bicep lengthening with STM trigger point with movement x 6 minutes PNF1 with PT assistance x15 PNF 2 with PT assistance x 15 Isometrics against PT hands: RUE: flexion, abduction, adduction, IR, ER 10x 3 second holds. Cueing for maintaining 40-60% muscle activation in correct alignment.                         PT Education - 02/03/18 1732    Education Details  exercise technique     Person(s) Educated  Patient    Methods  Explanation;Demonstration;Verbal cues    Comprehension  Verbalized understanding;Returned demonstration       PT Short Term Goals - 01/22/18 1737      PT SHORT TERM GOAL #1   Title  Patient will be independent in home exercise program to improve strength/mobility for better functional independence with ADLs.    Baseline  HEP given 9/4 compliant    Time  2    Period  Weeks    Status  Partially Met      PT SHORT TERM GOAL #2   Title  Patient will report a worst pain of 7/10 on VAS in  R shoulder to improve tolerance with ADLs and reduced symptoms with activities.     Baseline  8/8: 9/10 9/4: 7-8/10     Time  2    Period  Weeks    Status  Partially Met        PT Long Term Goals - 01/22/18 1738      PT LONG TERM GOAL #1   Title  Patient will report a worst pain of 5/10 on VAS in    R shoulder         to improve tolerance with ADLs and reduced symptoms with activities.     Baseline  8/8: 9/10 9/4: 7-8/10     Time  4    Period  Weeks    Status  Partially Met    Target Date  02/19/18      PT LONG TERM GOAL #2   Title  Patient will improve shoulder AROM to > 140 degrees of flexion, scaption, and abduction for improved ability to perform overhead activities.    Baseline  8/8: AROM standing:  flexion 95, abduction 99 9/4: flexion 110 painful  abduction: 106 painful    Time  4    Period  Weeks    Status  Partially Met    Target Date  02/19/18      PT LONG  TERM GOAL #3   Title  Patient will decrease Quick DASH score by > 8 points (gross 78%, work 92%) demonstrating reduced self-reported upper extremity disability.    Baseline  8/8: 86% work 100% 9/5:  56.8%, work 75%    Time  4    Period  Weeks    Status  Achieved      PT LONG TERM GOAL #4   Title  Patient will demonstrate 4/5 strength in RUE to allow patient to return to work and perform job duties without compensations.     Baseline  8/8: strength limited by ROM and pain 9/4: abduction and flexion 2+/5 due to pain    Time  4    Period  Weeks    Status  On-going    Target Date  02/19/18      PT LONG TERM GOAL #5   Title  Patient will decrease Quick DASH score by > 8 points (gross 48.8%, work 67%) demonstrating reduced self-reported upper extremity disability.    Baseline  9/4: 56.8%, work 75%    Time  4    Period  Weeks    Status  New    Target Date  02/19/18            Plan - 02/04/18 0752    Clinical Impression Statement  Patient demonstrates tenderness initially to trigger point with movement technique to bicep that resolved and resulted in reduced pain at end of session and improved mobility. Patient increasing available pain free ROM and improved postural alignment with decreased cueing. Patient will continue to benefit from skilled physical therapy for improvements in shoulder ROM, strength, and decreased pain.    Rehab Potential  Fair    Clinical Impairments Affecting Rehab Potential  (+) age, educational level (-) secondary injury     PT Frequency  2x / week    PT Duration  4 weeks    PT Treatment/Interventions  ADLs/Self Care Home Management;Aquatic Therapy;Cryotherapy;Electrical Stimulation;Parrafin;Ultrasound;Traction;Moist Heat;Iontophoresis 67m/ml Dexamethasone;Functional mobility training;Therapeutic  activities;Therapeutic exercise;Patient/family education;Neuromuscular re-education;Manual techniques;Manual lymph drainage;Compression bandaging;Taping;Energy conservation;Dry needling;Passive range of motion;Scar mobilization    PT Next Visit Plan  review HEP, abduction PROM, introduce AAROM, ice cup massage    PT Home Exercise Plan  see sheet    Consulted and Agree with Plan of Care  Patient       Patient will benefit from skilled therapeutic intervention in order to improve the following deficits and impairments:  Decreased activity tolerance, Decreased coordination, Decreased endurance, Decreased mobility, Decreased range of motion, Decreased strength, Decreased scar mobility, Hypomobility, Increased edema, Impaired flexibility, Impaired perceived functional ability, Increased muscle spasms, Impaired UE functional use, Postural dysfunction, Improper body mechanics, Pain  Visit Diagnosis: Acute pain of right shoulder  Muscle weakness (generalized)     Problem List There are no active problems to display for this patient.  MJanna Arch PT, DPT   02/04/2018, 7:55 AM  CNorth [place] JuanMAIN [facility]ast [facility] HospitalSERVICES [address] [place] [address]Phone: [phone]  Fax:  [phone] Name: TMAKAIYAH SCHWEIGERMRN: [id number] of Birth: 103-19-1972

## 2018-02-04 ENCOUNTER — Encounter: Payer: 59 | Admitting: Physical Therapy

## 2018-02-05 ENCOUNTER — Ambulatory Visit: Payer: 59

## 2018-02-05 DIAGNOSIS — M25511 Pain in right shoulder: Secondary | ICD-10-CM | POA: Diagnosis not present

## 2018-02-05 DIAGNOSIS — M6281 Muscle weakness (generalized): Secondary | ICD-10-CM | POA: Diagnosis not present

## 2018-02-05 NOTE — Therapy (Signed)
[place] MAIN [facility] SERVICES [address] [place], [place], [address] Phone: [phone]   Fax:  [phone]  Physical Therapy Treatment  Patient Details  Name: Charlene Daniels MRN: [id number] Date of Birth: 1971-02-10 No data recorded  Encounter Date: 02/05/2018  PT End of Session - 02/05/18 1726    Visit Number  10    Number of Visits  16    Date for PT Re-Evaluation  02/19/18    Authorization Type  3/10  starting 9/4)     PT Start Time  1729    PT Stop Time  1759    PT Time Calculation (min)  30 min    Activity Tolerance  Patient limited by pain;Patient tolerated treatment well    Behavior During Therapy  [facility] for tasks assessed/performed       Past Medical History:  Diagnosis Date  . Arthritis   . Chronic kidney disease    on meds to protect kidney from DM  . Diabetes mellitus without complication ([place])   . GERD (gastroesophageal reflux disease)   . Headache(784.0)   . PONV (postoperative nausea and vomiting)     Past Surgical History:  Procedure Laterality Date  . ABDOMINAL HYSTERECTOMY  2009  . CHOLECYSTECTOMY  1994  . DILATION AND CURETTAGE OF UTERUS  1994  . LYMPH NODE DISSECTION Right 1999   right neck cervical LN dissection after Cat-scratch fever  . SHOULDER ARTHROSCOPY WITH OPEN ROTATOR CUFF REPAIR Right 09/19/2017   Procedure: SHOULDER ARTHROSCOPY WITH OPEN ROTATOR CUFF REPAIR & SUBACROMINAL DECOMPRESSION.;  Surgeon: Thornton Park, MD;  Location: [facility];  Service: Orthopedics;  Laterality: Right;  . SHOULDER ARTHROSCOPY WITH ROTATOR CUFF REPAIR AND SUBACROMIAL DECOMPRESSION Left 02/12/2013   Procedure: LEFT SHOULDER ARTHROSCOPY WITH SUBACROMIAL DECOMPRESSION, DISTAL CLAVICLE RESECTION, POSSIBLE BICEP TENOTOMY, POSSIBLE ROTATOR CUFF REPAIR;  Surgeon: Marin Shutter, MD;  Location: [place];  Service: Orthopedics;  Laterality: Left;  . TONSILLECTOMY  1998    There were no vitals filed for this visit.  Subjective  Assessment - 02/05/18 1801    Subjective  Patient reports pain has increased, limiting her sleep. Is feeling more swollen and like "something is going wrong"     Pertinent History  atient was 8 weeks out right shoulder arthroscopy with mini open rotator cuff repair (performed 5/2)  when she moved her waist band feeling like "her arm had been ripped off". Since this moment on has not been out of pain. Went back to PT and her therapist felt like it had been retorn since she was not able to move arm. Mack Guise said MRI was false positive on tear, wanted pt to go to neurology thinking pain is from neck.  Was thinking it potentially could be complex regional pain syndrome. Patient wanted second opinion so went to [facility] in [place] to [employer]. Was not torn, anchor is lifted, where the repair is was not torn. Prior rotator cuff repair. Severe tendinosis of the supraspinatus tendon with a high-grade partial-thickness articular surface tear with a possible small full-thickness component. Severe tendinosis of the infraspinatus tendon.. Finished up steroid taper form Kransinski which helped with numb and ache.   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 Has not had PT for 10 days. Patient has lymphedema on R  Side.     Limitations  Reading;Lifting;Sitting;Walking;House hold activities;Other (comment)    Diagnostic tests  MRI: Prior  rotator cuff repair. Severe tendinosis of the    Patient Stated Goals  to reduce pain, return to work duties, improve ROM and strength     Currently in Pain?  Yes    Pain Score  6     Pain Location  Shoulder    Pain Orientation  Right    Pain Descriptors / Indicators  Aching    Pain Type  Acute pain;Surgical pain    Pain Onset  More than a month ago    Pain Frequency  Constant      Seated:  AAROM alphabet with UE ranger AAROM circles 10x clockwise, 10x counterclockwise with ranger; 3 sets   Supine: Distraction  with rhythmic rotation 60 seconds x 5 trials PROM flexion 8x 10 second holds   observation/palpation of R GH joint, bicep, and lymph system: noted warmth, swelling from armpit to fingers, and increased pain  Pain ranges from 6-8/10. Due to pain, warmth, and hx of lymphedema patient session terminated early and patient agreeable to call doctor to schedule appointment tomorrow.                       PT Education - 02/05/18 1725    Education Details  call doctor for appt tomorrow    Person(s) Educated  Patient    Methods  Explanation;Demonstration;Verbal cues    Comprehension  Verbalized understanding;Returned demonstration       PT Short Term Goals - 01/22/18 1737      PT SHORT TERM GOAL #1   Title  Patient will be independent in home exercise program to improve strength/mobility for better functional independence with ADLs.    Baseline  HEP given 9/4 compliant    Time  2    Period  Weeks    Status  Partially Met      PT SHORT TERM GOAL #2   Title  Patient will report a worst pain of 7/10 on VAS in  R shoulder to improve tolerance with ADLs and reduced symptoms with activities.     Baseline  8/8: 9/10 9/4: 7-8/10     Time  2    Period  Weeks    Status  Partially Met        PT Long Term Goals - 01/22/18 1738      PT LONG TERM GOAL #1   Title  Patient will report a worst pain of 5/10 on VAS in    R shoulder         to improve tolerance with ADLs and reduced symptoms with activities.     Baseline  8/8: 9/10 9/4: 7-8/10     Time  4    Period  Weeks    Status  Partially Met    Target Date  02/19/18      PT LONG TERM GOAL #2   Title  Patient will improve shoulder AROM to > 140 degrees of flexion, scaption, and abduction for improved ability to perform overhead activities.    Baseline  8/8: AROM standing: flexion 95, abduction 99 9/4: flexion 110 painful  abduction: 106 painful    Time  4    Period  Weeks    Status  Partially Met    Target Date  02/19/18       PT LONG TERM GOAL #3   Title  Patient will decrease Quick DASH score by > 8 points (gross 78%, work 92%) demonstrating reduced self-reported upper extremity disability.    Baseline  8/8: 86% work 100% 9/5:  56.8%, work 75%    Time  4    Period  Weeks    Status  Achieved      PT LONG TERM GOAL #4   Title  Patient will demonstrate 4/5 strength in RUE to allow patient to return to work and perform job duties without compensations.     Baseline  8/8: strength limited by ROM and pain 9/4: abduction and flexion 2+/5 due to pain    Time  4    Period  Weeks    Status  On-going    Target Date  02/19/18      PT LONG TERM GOAL #5   Title  Patient will decrease Quick DASH score by > 8 points (gross 48.8%, work 67%) demonstrating reduced self-reported upper extremity disability.    Baseline  9/4: 56.8%, work 75%    Time  4    Period  Weeks    Status  New    Target Date  02/19/18            Plan - 02/05/18 1802    Clinical Impression Statement  Pain ranges from 6-8/10. Due to pain, warmth, and hx of lymphedema patient session terminated early and patient agreeable to call doctor to schedule appointment tomorrow.     Rehab Potential  Fair    Clinical Impairments Affecting Rehab Potential  (+) age, educational level (-) secondary injury     PT Frequency  2x / week    PT Duration  4 weeks    PT Treatment/Interventions  ADLs/Self Care Home Management;Aquatic Therapy;Cryotherapy;Electrical Stimulation;Parrafin;Ultrasound;Traction;Moist Heat;Iontophoresis 43m/ml Dexamethasone;Functional mobility training;Therapeutic activities;Therapeutic exercise;Patient/family education;Neuromuscular re-education;Manual techniques;Manual lymph drainage;Compression bandaging;Taping;Energy conservation;Dry needling;Passive range of motion;Scar mobilization    PT Next Visit Plan  review HEP, abduction PROM, introduce AAROM, ice cup massage    PT Home Exercise Plan  see sheet    Consulted and Agree with Plan  of Care  Patient       Patient will benefit from skilled therapeutic intervention in order to improve the following deficits and impairments:  Decreased activity tolerance, Decreased coordination, Decreased endurance, Decreased mobility, Decreased range of motion, Decreased strength, Decreased scar mobility, Hypomobility, Increased edema, Impaired flexibility, Impaired perceived functional ability, Increased muscle spasms, Impaired UE functional use, Postural dysfunction, Improper body mechanics, Pain  Visit Diagnosis: Acute pain of right shoulder  Muscle weakness (generalized)     Problem List There are no active problems to display for this patient.  MJanna Arch PT, DPT   02/05/2018, 6:03 PM  CC[place]MAIN RThe Surgery Center At Pointe WestSERVICES [address]RMoskow[address] [place] [address]Phone: [phone]  Fax:  [phone] Name: TJANEA SCHWENNMRN: [id number] of Birth: 110-16-1972

## 2018-02-06 ENCOUNTER — Encounter: Payer: Self-pay | Admitting: Physical Therapy

## 2018-02-06 DIAGNOSIS — M67929 Unspecified disorder of synovium and tendon, unspecified upper arm: Secondary | ICD-10-CM | POA: Diagnosis not present

## 2018-02-06 DIAGNOSIS — R2231 Localized swelling, mass and lump, right upper limb: Secondary | ICD-10-CM | POA: Diagnosis not present

## 2018-02-07 ENCOUNTER — Other Ambulatory Visit: Payer: Self-pay | Admitting: Student

## 2018-02-07 DIAGNOSIS — M67929 Unspecified disorder of synovium and tendon, unspecified upper arm: Secondary | ICD-10-CM

## 2018-02-10 ENCOUNTER — Ambulatory Visit: Payer: 59

## 2018-02-10 DIAGNOSIS — M6281 Muscle weakness (generalized): Secondary | ICD-10-CM | POA: Diagnosis not present

## 2018-02-10 DIAGNOSIS — M25511 Pain in right shoulder: Secondary | ICD-10-CM

## 2018-02-10 NOTE — Therapy (Signed)
[place] MAIN [facility] SERVICES [address] [place], [place], [address] Phone: [phone]   Fax:  [phone]  Physical Therapy Treatment  Patient Details  Name: Charlene Daniels MRN: [id number] Date of Birth: Nov 26, 1970 No data recorded  Encounter Date: 02/10/2018  PT End of Session - 02/11/18 0821    Visit Number  11    Number of Visits  16    Date for PT Re-Evaluation  02/19/18    Authorization Type  4/10  starting 9/4)     PT Start Time  3846    PT Stop Time  1805    PT Time Calculation (min)  35 min    Activity Tolerance  Patient limited by pain;Patient tolerated treatment well    Behavior During Therapy  [facility] for tasks assessed/performed       Past Medical History:  Diagnosis Date  . Arthritis   . Chronic kidney disease    on meds to protect kidney from DM  . Diabetes mellitus without complication ([place])   . GERD (gastroesophageal reflux disease)   . Headache(784.0)   . PONV (postoperative nausea and vomiting)     Past Surgical History:  Procedure Laterality Date  . ABDOMINAL HYSTERECTOMY  2009  . CHOLECYSTECTOMY  1994  . DILATION AND CURETTAGE OF UTERUS  1994  . LYMPH NODE DISSECTION Right 1999   right neck cervical LN dissection after Cat-scratch fever  . SHOULDER ARTHROSCOPY WITH OPEN ROTATOR CUFF REPAIR Right 09/19/2017   Procedure: SHOULDER ARTHROSCOPY WITH OPEN ROTATOR CUFF REPAIR & SUBACROMINAL DECOMPRESSION.;  Surgeon: Thornton Park, MD;  Location: [facility];  Service: Orthopedics;  Laterality: Right;  . SHOULDER ARTHROSCOPY WITH ROTATOR CUFF REPAIR AND SUBACROMIAL DECOMPRESSION Left 02/12/2013   Procedure: LEFT SHOULDER ARTHROSCOPY WITH SUBACROMIAL DECOMPRESSION, DISTAL CLAVICLE RESECTION, POSSIBLE BICEP TENOTOMY, POSSIBLE ROTATOR CUFF REPAIR;  Surgeon: Marin Shutter, MD;  Location: [place];  Service: Orthopedics;  Laterality: Left;  . TONSILLECTOMY  1998    There were no vitals filed for this visit.  Subjective  Assessment - 02/10/18 1738    Subjective  Patient went to doctor and got cleared of lymphedema and cellulitis. WIll get an MRI soon. Patient did not do her HEP since last session.     Pertinent History  atient was 8 weeks out right shoulder arthroscopy with mini open rotator cuff repair (performed 5/2)  when she moved her waist band feeling like "her arm had been ripped off". Since this moment on has not been out of pain. Went back to PT and her therapist felt like it had been retorn since she was not able to move arm. Mack Guise said MRI was false positive on tear, wanted pt to go to neurology thinking pain is from neck.  Was thinking it potentially could be complex regional pain syndrome. Patient wanted second opinion so went to [facility] in [place] to [employer]. Was not torn, anchor is lifted, where the repair is was not torn. Prior rotator cuff repair. Severe tendinosis of the supraspinatus tendon with a high-grade partial-thickness articular surface tear with a possible small full-thickness component. Severe tendinosis of the infraspinatus tendon.. Finished up steroid taper form Kransinski which helped with numb and ache.   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 Still not sleeping in bed , wants to get out of recliner. Has not had PT for 10 days. Patient has lymphedema on R  Side.     Limitations  Reading;Lifting;Sitting;Walking;House hold activities;Other (comment)  Diagnostic tests  MRI: Prior rotator cuff repair. Severe tendinosis of the    Patient Stated Goals  to reduce pain, return to work duties, improve ROM and strength     Currently in Pain?  Yes    Pain Score  3     Pain Location  Shoulder    Pain Orientation  Right    Pain Descriptors / Indicators  Aching    Pain Type  Acute pain    Pain Onset  More than a month ago    Pain Frequency  Constant       Standing:  UE ranger: flexion 10x UE ranger: crescent 12x.   Seated: Scapular retractions via bringing arms from needs to  hips to increase ROM and upright posture x 20  AAROM alphabet with UE ranger AAROM circles 10x clockwise, 10x counterclockwise with ranger   Supine: Distraction with rhythmic rotation 60 seconds x 5 trials PROM flexion 8x 10 second holds, abduction 8x 10 second holds, ER 8x 10 second holds, IR 8x 10 second holds Bicep lengthening with movement x 4 minutes PNF1 modified with PT assistance x15 PNF 2 modified with PT assistance x 15 . STM anterior bicep/GH joint with focus on pectoral musculature x 3 minutes      Patient had to leave early limiting session duration.                     PT Education - 02/10/18 1740    Education Details  HEP performance.     Person(s) Educated  Patient    Methods  Explanation;Demonstration;Verbal cues    Comprehension  Verbalized understanding;Returned demonstration       PT Short Term Goals - 01/22/18 1737      PT SHORT TERM GOAL #1   Title  Patient will be independent in home exercise program to improve strength/mobility for better functional independence with ADLs.    Baseline  HEP given 9/4 compliant    Time  2    Period  Weeks    Status  Partially Met      PT SHORT TERM GOAL #2   Title  Patient will report a worst pain of 7/10 on VAS in  R shoulder to improve tolerance with ADLs and reduced symptoms with activities.     Baseline  8/8: 9/10 9/4: 7-8/10     Time  2    Period  Weeks    Status  Partially Met        PT Long Term Goals - 01/22/18 1738      PT LONG TERM GOAL #1   Title  Patient will report a worst pain of 5/10 on VAS in    R shoulder         to improve tolerance with ADLs and reduced symptoms with activities.     Baseline  8/8: 9/10 9/4: 7-8/10     Time  4    Period  Weeks    Status  Partially Met    Target Date  02/19/18      PT LONG TERM GOAL #2   Title  Patient will improve shoulder AROM to > 140 degrees of flexion, scaption, and abduction for improved ability to perform overhead activities.     Baseline  8/8: AROM standing: flexion 95, abduction 99 9/4: flexion 110 painful  abduction: 106 painful    Time  4    Period  Weeks    Status  Partially Met  Target Date  02/19/18      PT LONG TERM GOAL #3   Title  Patient will decrease Quick DASH score by > 8 points (gross 78%, work 92%) demonstrating reduced self-reported upper extremity disability.    Baseline  8/8: 86% work 100% 9/5:  56.8%, work 75%    Time  4    Period  Weeks    Status  Achieved      PT LONG TERM GOAL #4   Title  Patient will demonstrate 4/5 strength in RUE to allow patient to return to work and perform job duties without compensations.     Baseline  8/8: strength limited by ROM and pain 9/4: abduction and flexion 2+/5 due to pain    Time  4    Period  Weeks    Status  On-going    Target Date  02/19/18      PT LONG TERM GOAL #5   Title  Patient will decrease Quick DASH score by > 8 points (gross 48.8%, work 67%) demonstrating reduced self-reported upper extremity disability.    Baseline  9/4: 56.8%, work 75%    Time  4    Period  Weeks    Status  New    Target Date  02/19/18            Plan - 02/11/18 0825    Clinical Impression Statement   Patient had to leave early limiting session duration.  Patient limited by pain throughout session however upon STM to pectoral musculature and modified PNF pattern 2 reduced pain to 0/10. Patient has noted improved pain free range when performing tasks when distracted rather than focused allowing patient to break through pain fear mindset. Patient will continue to benefit from skilled physical therapy for improvements in shoulder ROM, strength, and decreased pain.    Rehab Potential  Fair    Clinical Impairments Affecting Rehab Potential  (+) age, educational level (-) secondary injury     PT Frequency  2x / week    PT Duration  4 weeks    PT Treatment/Interventions  ADLs/Self Care Home Management;Aquatic Therapy;Cryotherapy;Electrical  Stimulation;Parrafin;Ultrasound;Traction;Moist Heat;Iontophoresis 13m/ml Dexamethasone;Functional mobility training;Therapeutic activities;Therapeutic exercise;Patient/family education;Neuromuscular re-education;Manual techniques;Manual lymph drainage;Compression bandaging;Taping;Energy conservation;Dry needling;Passive range of motion;Scar mobilization    PT Next Visit Plan  review HEP, abduction PROM, introduce AAROM, ice cup massage    PT Home Exercise Plan  see sheet    Consulted and Agree with Plan of Care  Patient       Patient will benefit from skilled therapeutic intervention in order to improve the following deficits and impairments:  Decreased activity tolerance, Decreased coordination, Decreased endurance, Decreased mobility, Decreased range of motion, Decreased strength, Decreased scar mobility, Hypomobility, Increased edema, Impaired flexibility, Impaired perceived functional ability, Increased muscle spasms, Impaired UE functional use, Postural dysfunction, Improper body mechanics, Pain  Visit Diagnosis: Acute pain of right shoulder  Muscle weakness (generalized)     Problem List There are no active problems to display for this patient.  MJanna Arch PT, DPT   02/11/2018, 8:26 AM  [place] [facility]SERVICES [address] [address]Phone: [phone]  Fax:  [phone] Name: TVENTURA LEGGITTMRN: [id number] of Birth: [birth year]

## 2018-02-12 ENCOUNTER — Ambulatory Visit: Payer: 59

## 2018-02-12 DIAGNOSIS — M25511 Pain in right shoulder: Secondary | ICD-10-CM

## 2018-02-12 DIAGNOSIS — M6281 Muscle weakness (generalized): Secondary | ICD-10-CM

## 2018-02-12 NOTE — Therapy (Signed)
[place] MAIN [facility] SERVICES [address] [place], [place], [address] Phone: [phone]   Fax:  [phone]  Physical Therapy Treatment  Patient Details  Name: Charlene Daniels MRN: [id number] Date of Birth: 1970-06-28 No data recorded  Encounter Date: 02/12/2018  PT End of Session - 02/12/18 1733    Visit Number  12    Number of Visits  16    Date for PT Re-Evaluation  02/19/18    Authorization Type  5/10  starting 9/4)     PT Start Time  1730    PT Stop Time  1814    PT Time Calculation (min)  44 min    Activity Tolerance  Patient limited by pain;Patient tolerated treatment well    Behavior During Therapy  [facility] for tasks assessed/performed       Past Medical History:  Diagnosis Date  . Arthritis   . Chronic kidney disease    on meds to protect kidney from DM  . Diabetes mellitus without complication ([place])   . GERD (gastroesophageal reflux disease)   . Headache(784.0)   . PONV (postoperative nausea and vomiting)     Past Surgical History:  Procedure Laterality Date  . ABDOMINAL HYSTERECTOMY  2009  . CHOLECYSTECTOMY  1994  . DILATION AND CURETTAGE OF UTERUS  1994  . LYMPH NODE DISSECTION Right 1999   right neck cervical LN dissection after Cat-scratch fever  . SHOULDER ARTHROSCOPY WITH OPEN ROTATOR CUFF REPAIR Right 09/19/2017   Procedure: SHOULDER ARTHROSCOPY WITH OPEN ROTATOR CUFF REPAIR & SUBACROMINAL DECOMPRESSION.;  Surgeon: Thornton Park, MD;  Location: [facility];  Service: Orthopedics;  Laterality: Right;  . SHOULDER ARTHROSCOPY WITH ROTATOR CUFF REPAIR AND SUBACROMIAL DECOMPRESSION Left 02/12/2013   Procedure: LEFT SHOULDER ARTHROSCOPY WITH SUBACROMIAL DECOMPRESSION, DISTAL CLAVICLE RESECTION, POSSIBLE BICEP TENOTOMY, POSSIBLE ROTATOR CUFF REPAIR;  Surgeon: Marin Shutter, MD;  Location: [place];  Service: Orthopedics;  Laterality: Left;  . TONSILLECTOMY  1998    There were no vitals filed for this visit.  Subjective  Assessment - 02/12/18 1732    Subjective  Patient reports she had a bad day today with pain in the armpit/discomfort. Has been doing his HEP.     Pertinent History  atient was 8 weeks out right shoulder arthroscopy with mini open rotator cuff repair (performed 5/2)  when she moved her waist band feeling like "her arm had been ripped off". Since this moment on has not been out of pain. Went back to PT and her therapist felt like it had been retorn since she was not able to move arm. Mack Guise said MRI was false positive on tear, wanted pt to go to neurology thinking pain is from neck.  Was thinking it potentially could be complex regional pain syndrome. Patient wanted second opinion so went to [facility] in [place] to [employer]. Was not torn, anchor is lifted, where the repair is was not torn. Prior rotator cuff repair. Severe tendinosis of the supraspinatus tendon with a high-grade partial-thickness articular surface tear with a possible small full-thickness component. Severe tendinosis of the infraspinatus tendon.. Finished up steroid taper form Kransinski which helped with numb and ache.   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 Still not sleeping in bed , wants to get out of recliner. Has not had PT for 10 days. Patient has lymphedema on R  Side.     Limitations  Reading;Lifting;Sitting;Walking;House hold activities;Other (comment)    Diagnostic tests  MRI: Prior  rotator cuff repair. Severe tendinosis of the    Patient Stated Goals  to reduce pain, return to work duties, improve ROM and strength     Currently in Pain?  Yes    Pain Score  6     Pain Location  Shoulder    Pain Orientation  Right    Pain Descriptors / Indicators  Aching    Pain Type  Acute pain    Pain Onset  More than a month ago    Pain Frequency  Constant        Standing:  UE ranger: flexion 10x UE ranger: crescent 12x.    Seated: Scapular retractions via bringing arms from needs to hips to increase ROM and upright  posture x 20  AAROM alphabet with UE ranger AAROM circles 10x clockwise, 10x counterclockwise with ranger Scapular mobilizations: AP 10x, PA: 10x, upward rotation downward rotation 10x (PROM) improved mobility with repetition   Supine: Distraction with rhythmic rotation 60 seconds x 5 trials PROM flexion 8x 10 second holds, abduction 8x 10 second holds, ER 8x 10 second holds, IR 8x 10 second holds Bicep lengthening with movement x 4 minutes PNF1 modified with PT assistance x15 PNF 2 modified with PT assistance x 15 . STM anterior bicep/GH joint/ subscap with focus on pectoral musculature x 8 minutes Rotational mobilizations of shoulder with three point hold, focus on AP and inferior mobilization.                              PT Education - 02/12/18 1733    Education Details  exercise technique, manual     Person(s) Educated  Patient    Methods  Explanation;Demonstration;Verbal cues    Comprehension  Verbalized understanding;Returned demonstration       PT Short Term Goals - 01/22/18 1737      PT SHORT TERM GOAL #1   Title  Patient will be independent in home exercise program to improve strength/mobility for better functional independence with ADLs.    Baseline  HEP given 9/4 compliant    Time  2    Period  Weeks    Status  Partially Met      PT SHORT TERM GOAL #2   Title  Patient will report a worst pain of 7/10 on VAS in  R shoulder to improve tolerance with ADLs and reduced symptoms with activities.     Baseline  8/8: 9/10 9/4: 7-8/10     Time  2    Period  Weeks    Status  Partially Met        PT Long Term Goals - 01/22/18 1738      PT LONG TERM GOAL #1   Title  Patient will report a worst pain of 5/10 on VAS in    R shoulder         to improve tolerance with ADLs and reduced symptoms with activities.     Baseline  8/8: 9/10 9/4: 7-8/10     Time  4    Period  Weeks    Status  Partially Met    Target Date  02/19/18      PT LONG TERM GOAL  #2   Title  Patient will improve shoulder AROM to > 140 degrees of flexion, scaption, and abduction for improved ability to perform overhead activities.    Baseline  8/8: AROM standing: flexion 95, abduction 99 9/4: flexion 110 painful  abduction: 106 painful    Time  4    Period  Weeks    Status  Partially Met    Target Date  02/19/18      PT LONG TERM GOAL #3   Title  Patient will decrease Quick DASH score by > 8 points (gross 78%, work 92%) demonstrating reduced self-reported upper extremity disability.    Baseline  8/8: 86% work 100% 9/5:  56.8%, work 75%    Time  4    Period  Weeks    Status  Achieved      PT LONG TERM GOAL #4   Title  Patient will demonstrate 4/5 strength in RUE to allow patient to return to work and perform job duties without compensations.     Baseline  8/8: strength limited by ROM and pain 9/4: abduction and flexion 2+/5 due to pain    Time  4    Period  Weeks    Status  On-going    Target Date  02/19/18      PT LONG TERM GOAL #5   Title  Patient will decrease Quick DASH score by > 8 points (gross 48.8%, work 67%) demonstrating reduced self-reported upper extremity disability.    Baseline  9/4: 56.8%, work 75%    Time  4    Period  Weeks    Status  New    Target Date  02/19/18            Plan - 02/13/18 0758    Clinical Impression Statement  Patient presents with increased pain in R arm since waking up this morning that was reduced to 3/10 by end of session. Patient requires cueing for task orientation and is highly pain centric focused. Will be going back to the doctor the 9th and get an MRI the 8th. Patient will continue to benefit from skilled physical therapy for improvements in shoulder ROM, strength, and decreased pain    Rehab Potential  Fair    Clinical Impairments Affecting Rehab Potential  (+) age, educational level (-) secondary injury     PT Frequency  2x / week    PT Duration  4 weeks    PT Treatment/Interventions  ADLs/Self Care Home  Management;Aquatic Therapy;Cryotherapy;Electrical Stimulation;Parrafin;Ultrasound;Traction;Moist Heat;Iontophoresis 68m/ml Dexamethasone;Functional mobility training;Therapeutic activities;Therapeutic exercise;Patient/family education;Neuromuscular re-education;Manual techniques;Manual lymph drainage;Compression bandaging;Taping;Energy conservation;Dry needling;Passive range of motion;Scar mobilization    PT Next Visit Plan  review HEP, abduction PROM, introduce AAROM, ice cup massage    PT Home Exercise Plan  see sheet    Consulted and Agree with Plan of Care  Patient       Patient will benefit from skilled therapeutic intervention in order to improve the following deficits and impairments:  Decreased activity tolerance, Decreased coordination, Decreased endurance, Decreased mobility, Decreased range of motion, Decreased strength, Decreased scar mobility, Hypomobility, Increased edema, Impaired flexibility, Impaired perceived functional ability, Increased muscle spasms, Impaired UE functional use, Postural dysfunction, Improper body mechanics, Pain  Visit Diagnosis: Acute pain of right shoulder  Muscle weakness (generalized)     Problem List There are no active problems to display for this patient.  MJanna Arch PT, DPT   02/13/2018, 8:02 AM  [place]MAIN RWallowa [facility] HospitalSERVICES [address]RL[place] [address]Phone: [phone]  Fax:  [phone] Name: Charlene KLEMZMRN: [id number] of Birth: [birth year]

## 2018-02-17 ENCOUNTER — Telehealth: Payer: Self-pay | Admitting: Emergency Medicine

## 2018-02-17 ENCOUNTER — Other Ambulatory Visit: Payer: Self-pay

## 2018-02-17 ENCOUNTER — Ambulatory Visit: Payer: 59

## 2018-02-17 ENCOUNTER — Ambulatory Visit (INDEPENDENT_AMBULATORY_CARE_PROVIDER_SITE_OTHER): Payer: 59

## 2018-02-17 ENCOUNTER — Encounter: Payer: Self-pay | Admitting: Emergency Medicine

## 2018-02-17 ENCOUNTER — Ambulatory Visit
Admission: EM | Admit: 2018-02-17 | Discharge: 2018-02-17 | Disposition: A | Discharge status: Home / Self Care | Payer: 59 | Attending: Family Medicine | Admitting: Family Medicine

## 2018-02-17 DIAGNOSIS — Y9301 Activity, walking, marching and hiking: Secondary | ICD-10-CM | POA: Diagnosis not present

## 2018-02-17 DIAGNOSIS — S92354A Nondisplaced fracture of fifth metatarsal bone, right foot, initial encounter for closed fracture: Secondary | ICD-10-CM | POA: Diagnosis not present

## 2018-02-17 DIAGNOSIS — S92351A Displaced fracture of fifth metatarsal bone, right foot, initial encounter for closed fracture: Secondary | ICD-10-CM

## 2018-02-17 NOTE — Discharge Instructions (Addendum)
Keep in splint.  Continue to ice and elevate.  No weightbearing.  Follow-up with podiatry in 2 days as scheduled.  Follow up with your primary care physician this week as needed. Return to Urgent care for new or worsening concerns.

## 2018-02-17 NOTE — ED Provider Notes (Signed)
[facility] ____________________________________________  Time seen: Approximately 1100 AM  I have reviewed the triage vital signs and the nursing notes.   HISTORY  Chief Complaint Foot Injury (right (DOI 02/16/18))   HPI Charlene Daniels is a 47 y.o. female presenting for evaluation of right foot pain post injury that occurred last night at home.  States she was walking through her kitchen floors, states hard floor and she went to almost roll her ankle, but reports that only her right foot rolled.  Pain currently mild at rest, moderate with palpation or weightbearing.  States that she immediately felt and heard a pop with associated pain.  Did not fall to the ground.  Denies other pain or injuries.  Difficulty weightbearing since.  Has been taking over-the-counter ibuprofen which is helped some as well as elevated and applied ice.  Denies other aggravating alleviating factors.  Reports otherwise feeling well denies other complaints.  Rusty Aus, MD: PCP  Past Medical History:  Diagnosis Date  . Arthritis   . Chronic kidney disease    on meds to protect kidney from DM  . Diabetes mellitus without complication ([place])   . GERD (gastroesophageal reflux disease)   . Headache(784.0)   . PONV (postoperative nausea and vomiting)     There are no active problems to display for this patient.   Past Surgical History:  Procedure Laterality Date  . ABDOMINAL HYSTERECTOMY  2009  . CHOLECYSTECTOMY  1994  . DILATION AND CURETTAGE OF UTERUS  1994  . LYMPH NODE DISSECTION Right 1999   right neck cervical LN dissection after Cat-scratch fever  . SHOULDER ARTHROSCOPY WITH OPEN ROTATOR CUFF REPAIR Right 09/19/2017   Procedure: SHOULDER ARTHROSCOPY WITH OPEN ROTATOR CUFF REPAIR & SUBACROMINAL DECOMPRESSION.;  Surgeon: Thornton Park, MD;  Location: [facility];  Service: Orthopedics;  Laterality: Right;  . SHOULDER ARTHROSCOPY WITH ROTATOR CUFF REPAIR AND SUBACROMIAL DECOMPRESSION  Left 02/12/2013   Procedure: LEFT SHOULDER ARTHROSCOPY WITH SUBACROMIAL DECOMPRESSION, DISTAL CLAVICLE RESECTION, POSSIBLE BICEP TENOTOMY, POSSIBLE ROTATOR CUFF REPAIR;  Surgeon: Marin Shutter, MD;  Location: [place];  Service: Orthopedics;  Laterality: Left;  . TONSILLECTOMY  1998     No current facility-administered medications for this encounter.   Current Outpatient Medications:  .  acetaminophen (TYLENOL) 500 MG tablet, Take 1,000 mg by mouth 2 (two) times daily as needed (for pain.)., Disp: , Rfl:  .  Ascorbic Acid (VITAMIN C PO), Take 1 tablet by mouth at bedtime., Disp: , Rfl:  .  aspirin EC 81 MG tablet, Take 81 mg by mouth at bedtime., Disp: , Rfl:  .  cholecalciferol (VITAMIN D) 1000 units tablet, Take 1,000 Units by mouth at bedtime., Disp: , Rfl:  .  Dulaglutide (TRULICITY) 3.76 EG/3.1DV SOPN, Inject 0.75 mg into the skin once a week., Disp: , Rfl:  .  estradiol (ESTRACE) 2 MG tablet, Take 1 mg by mouth at bedtime., Disp: , Rfl: 99 .  Garlic 7616 MG CAPS, Take 1,000 mg by mouth at bedtime., Disp: , Rfl:  .  glimepiride (AMARYL) 2 MG tablet, Take 2 mg by mouth daily at 12 noon. , Disp: , Rfl: 98 .  ibuprofen (ADVIL,MOTRIN) 200 MG tablet, Take 800 mg by mouth 2 (two) times daily as needed (for pain.)., Disp: , Rfl:  .  KRILL OIL PO, Take 1 capsule by mouth at bedtime., Disp: , Rfl:  .  lisinopril-hydrochlorothiazide (PRINZIDE,ZESTORETIC) 20-12.5 MG per tablet, Take 0.5 tablets by mouth at bedtime. , Disp: ,  Rfl:  .  metFORMIN (GLUCOPHAGE) 1000 MG tablet, Take 1,000 mg by mouth 2 (two) times daily with a meal., Disp: , Rfl:  .  OVER THE COUNTER MEDICATION, Take 5 drops by mouth 2 (two) times daily. CBD OIL, Disp: , Rfl:  .  pantoprazole (PROTONIX) 40 MG tablet, Take 40 mg by mouth at bedtime. , Disp: , Rfl:  .  Probiotic Product (PROBIOTIC PO), Take 1 capsule by mouth at bedtime., Disp: , Rfl:  .  spironolactone (ALDACTONE) 50 MG tablet, Take 50 mg by mouth at bedtime. , Disp: , Rfl:    .  vitamin B-12 (CYANOCOBALAMIN) 1000 MCG tablet, Take 1,000 mcg by mouth at bedtime., Disp: , Rfl:   Allergies Darvocet [propoxyphene n-acetaminophen]; Ultram [tramadol]; and Sulfa antibiotics  Family History  Problem Relation Age of Onset  . Ovarian cancer Mother 27  . Esophageal cancer Father 76  . Breast cancer Maternal Aunt 46    Social History Social History   Tobacco Use  . Smoking status: Never Smoker  . Smokeless tobacco: Never Used  Substance Use Topics  . Alcohol use: No  . Drug use: No    Review of Systems Constitutional: No fever Cardiovascular: Denies chest pain. Respiratory: Denies shortness of breath. Musculoskeletal: Negative for back pain. AS above.  Skin: Negative for rash.  ____________________________________________   PHYSICAL EXAM:  VITAL SIGNS: ED Triage Vitals  Enc Vitals Group     BP 02/17/18 1017 111/62     Pulse Rate 02/17/18 1017 79     Resp 02/17/18 1017 16     Temp 02/17/18 1017 98.5 F (36.9 C)     Temp Source 02/17/18 1017 Oral     SpO2 02/17/18 1017 99 %     Weight 02/17/18 1016 279 lb (126.6 kg)     Height 02/17/18 1016 5\' 5"  (1.651 m)     Head Circumference --      Peak Flow --      Pain Score 02/17/18 1016 5     Pain Loc --      Pain Edu? --      Excl. in [place]? --     Constitutional: Alert and oriented. Well appearing and in no acute distress. ENT      Head: Normocephalic and atraumatic. Cardiovascular: Normal rate, regular rhythm. Grossly normal heart sounds.  Good peripheral circulation. Respiratory: Normal respiratory effort without tachypnea nor retractions. Breath sounds are clear and equal bilaterally. No wheezes, rales, rhonchi. Musculoskeletal:  Bilateral pedal pulses equal and easily palpated.  Gait not tested due to pain. Except: Right dorsal mid lateral mid fifth metacarpal moderate pain to direct palpation with mild localized edema and slight ecchymosis, normal distal sensation and capillary refill, mild  pain with plantarflexion and dorsiflexion, full range of motion present, right lower extremity otherwise nontender. Neurologic:  Normal speech and language.  Skin:  Skin is warm, dry and intact. No rash noted. Psychiatric: Mood and affect are normal. Speech and behavior are normal. Patient exhibits appropriate insight and judgment   ___________________________________________   LABS (all labs ordered are listed, but only abnormal results are displayed)  Labs Reviewed - No data to display  RADIOLOGY  Dg Foot Complete Right  Result Date: 02/17/2018 CLINICAL DATA:  47 year old female status post twisting injury yesterday with pain radiating from the 4th toe to the midfoot. EXAM: RIGHT FOOT COMPLETE - 3+ VIEW COMPARISON:  None. FINDINGS: Oblique or spiral and mildly comminuted extra-articular fracture of the right 5th metatarsal from  the metatarsal neck through the midshaft. Minimal displacement, overriding (image 2). Superimposed chronic degenerative osteophytosis and fragmentation at the base of the 5th metatarsal. Phalanges appear intact. The other metatarsals appear normal for age. There is mild 1st MTP joint degeneration. The tarsal bones and calcaneus appear intact with degenerative spurring. Generalized soft tissue swelling. IMPRESSION: 1. Oblique/spiral and mildly comminuted but minimally displaced extra-articular fracture of the 5th metatarsal. 2. Superimposed age advanced degenerative changes at the base of the 5th metatarsal, and in the tarsal bones. Electronically Signed   By: Genevie Ann M.D.   On: 02/17/2018 10:40   ____________________________________________   PROCEDURES Procedures   INITIAL IMPRESSION / ASSESSMENT AND PLAN / ED COURSE  Pertinent labs & imaging results that were available during my care of the patient were reviewed by me and considered in my medical decision making (see chart for details).  Well-appearing patient.  No acute distress.  Mechanical injury to right  foot.  Right foot x-ray as above per radiologist with oblique mildly comminuted minimally displaced extra-articular fracture of the fifth metatarsal.  Posterior OCL splint applied.  Directed nonweightbearing.  Patient reports that she has been undergoing right shoulder issues and unable to use crutches, further states unable to use a walker.  Nurses contacted local pharmacy who had rolling scooter, Rx written.  Directed nonweightbearing, ice, elevation and follow-up with podiatry.  Patient states that she has an appointment for podiatry this coming Wednesday, recommended to keep.  Patient declines need for pain medication Rx.  Encourage supportive care.  Discussed follow up with Primary care physician this week as needed. Discussed follow up and return parameters including no resolution or any worsening concerns. Patient verbalized understanding and agreed to plan.   ____________________________________________   FINAL CLINICAL IMPRESSION(S) / ED DIAGNOSES  Final diagnoses:  Displaced fracture of fifth metatarsal bone, right foot, initial encounter for closed fracture     ED Discharge Orders    None       Note: This dictation was prepared with Dragon dictation along with smaller phrase technology. Any transcriptional errors that result from this process are unintentional.         Marylene Land, NP 02/17/18 1353

## 2018-02-17 NOTE — ED Triage Notes (Signed)
Patient in today c/o right foot pain after twisting foot last night. Patient states she heard a pop. Patient has iced and took Advil last night.

## 2018-02-17 NOTE — Telephone Encounter (Signed)
Patient called in stating that she went to the pharmacy to get a leg scooter/walker and she tried to use it, but immediately had severe shoulder pain. Patient states that she got a can and wondered if she could get a cam-walker. Spoke with Marylene Land, NP she advised that she absolutely does not condone using a cane or cam-walker. Patient needs to be non-weight bearing to try to avoid further displacement of the fracture. Patient was advised of Lindsey's recommendations. Patient has appointment with podiatry on 02/19/18.Patient voiced understanding.

## 2018-02-19 ENCOUNTER — Ambulatory Visit: Payer: Self-pay

## 2018-02-19 ENCOUNTER — Ambulatory Visit: Payer: 59

## 2018-02-19 DIAGNOSIS — S92351A Displaced fracture of fifth metatarsal bone, right foot, initial encounter for closed fracture: Secondary | ICD-10-CM | POA: Diagnosis not present

## 2018-02-24 ENCOUNTER — Ambulatory Visit: Payer: 59 | Attending: Orthopedic Surgery

## 2018-02-24 DIAGNOSIS — M25511 Pain in right shoulder: Secondary | ICD-10-CM | POA: Insufficient documentation

## 2018-02-24 DIAGNOSIS — M6281 Muscle weakness (generalized): Secondary | ICD-10-CM | POA: Insufficient documentation

## 2018-02-24 NOTE — Therapy (Signed)
[place] MAIN [facility] SERVICES [address] [place], [place], [address] Phone: [phone]   Fax:  [phone]  Physical Therapy Treatment Physical Therapy Progress Note   Dates of reporting period  01/22/18 to   02/24/18  Patient Details  Name: Charlene Daniels MRN: [id number] Date of Birth: December 08, 1970 No data recorded  Encounter Date: 02/24/2018  PT End of Session - 02/24/18 1728    Visit Number  13    Number of Visits  21    Date for PT Re-Evaluation  03/24/18    Authorization Type  6/10   (next 1/10 starting 02/24/18)      PT Start Time  1720    PT Stop Time  1805    PT Time Calculation (min)  45 min    Activity Tolerance  Patient limited by pain;Patient tolerated treatment well    Behavior During Therapy  [facility] for tasks assessed/performed       Past Medical History:  Diagnosis Date  . Arthritis   . Chronic kidney disease    on meds to protect kidney from DM  . Diabetes mellitus without complication ([place])   . GERD (gastroesophageal reflux disease)   . Headache(784.0)   . PONV (postoperative nausea and vomiting)     Past Surgical History:  Procedure Laterality Date  . ABDOMINAL HYSTERECTOMY  2009  . CHOLECYSTECTOMY  1994  . DILATION AND CURETTAGE OF UTERUS  1994  . LYMPH NODE DISSECTION Right 1999   right neck cervical LN dissection after Cat-scratch fever  . SHOULDER ARTHROSCOPY WITH OPEN ROTATOR CUFF REPAIR Right 09/19/2017   Procedure: SHOULDER ARTHROSCOPY WITH OPEN ROTATOR CUFF REPAIR & SUBACROMINAL DECOMPRESSION.;  Surgeon: Thornton Park, MD;  Location: [facility];  Service: Orthopedics;  Laterality: Right;  . SHOULDER ARTHROSCOPY WITH ROTATOR CUFF REPAIR AND SUBACROMIAL DECOMPRESSION Left 02/12/2013   Procedure: LEFT SHOULDER ARTHROSCOPY WITH SUBACROMIAL DECOMPRESSION, DISTAL CLAVICLE RESECTION, POSSIBLE BICEP TENOTOMY, POSSIBLE ROTATOR CUFF REPAIR;  Surgeon: Marin Shutter, MD;  Location: [place];  Service: Orthopedics;   Laterality: Left;  . TONSILLECTOMY  1998    There were no vitals filed for this visit.  Subjective Assessment - 02/24/18 1725    Subjective  Patient broke her R foot last sunday and currently is in a boot and nonweightbearing since falling in the kitchen. Has been compliant with HEP but having difficulty due to difficult moving arm with limited leg mobility.     Pertinent History  atient was 8 weeks out right shoulder arthroscopy with mini open rotator cuff repair (performed 5/2)  when she moved her waist band feeling like "her arm had been ripped off". Since this moment on has not been out of pain. Went back to PT and her therapist felt like it had been retorn since she was not able to move arm. Mack Guise said MRI was false positive on tear, wanted pt to go to neurology thinking pain is from neck.  Was thinking it potentially could be complex regional pain syndrome. Patient wanted second opinion so went to [facility] in [place] to [employer]. Was not torn, anchor is lifted, where the repair is was not torn. Prior rotator cuff repair. Severe tendinosis of the supraspinatus tendon with a high-grade partial-thickness articular surface tear with a possible small full-thickness component. Severe tendinosis of the infraspinatus tendon.. Finished up steroid taper form [place] which helped with numb and ache.   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 Still not sleeping in bed ,  wants to get out of recliner. Has not had PT for 10 days. Patient has lymphedema on R  Side.     Limitations  Reading;Lifting;Sitting;Walking;House hold activities;Other (comment)    Diagnostic tests  MRI: Prior rotator cuff repair. Severe tendinosis of the    Patient Stated Goals  to reduce pain, return to work duties, improve ROM and strength     Currently in Pain?  Yes    Pain Score  4     Pain Location  Shoulder    Pain Orientation  Right    Pain Descriptors / Indicators  Aching    Pain Type  Acute pain    Pain Onset   More than a month ago    Pain Frequency  Constant      VAS: : 7/10 2-3x/day    AROM  seated  Flexion: L  177  R 116  Abduction: L  164 R:128   MMT QuickDash : 61/4%    Seated: Scapular retractions via bringing arms from needs to hips to increase ROM and upright posture x 20  AAROM alphabet with UE ranger AAROM circles 10x clockwise, 10x counterclockwise with ranger cues for widening range and slowing velocity Scapular mobilizations: AP 10x, PA: 10x, upward rotation downward rotation 10x (PROM) improved mobility with repetition   Supine: Distraction with rhythmic rotation 60 seconds x 5 trials PROM flexion 8x 10 second holds, abduction 8x 10 second holds, ER 8x 10 second holds, IR 8x 10 second holds Bicep lengthening with movement x 4 minutes PNF1 modified with PT assistance x15 PNF 2 modified with PT assistance x 15 . STM anterior bicep/GH joint/ subscap with focus on pectoral musculature x 8 minutes Rotational mobilizations of shoulder with three point hold, focus on AP and inferior mobilization.      Patient's condition has the potential to improve in response to therapy. Maximum improvement is yet to be obtained. The anticipated improvement is attainable and reasonable in a generally predictable time.  Patient reports her pain is improving allowing her to move her arm more, however feels very limited and continues to have her sleep affected.                          PT Education - 02/24/18 1727    Education Details  exercise technique, recert    Person(s) Educated  Patient    Methods  Explanation;Demonstration;Verbal cues    Comprehension  Verbalized understanding;Returned demonstration       PT Short Term Goals - 02/25/18 0817      PT SHORT TERM GOAL #1   Title  Patient will be independent in home exercise program to improve strength/mobility for better functional independence with ADLs.    Baseline  HEP given 9/4 compliant 10/8: compliant     Time  2    Period  Weeks    Status  Partially Met      PT SHORT TERM GOAL #2   Title  Patient will report a worst pain of 7/10 on VAS in  R shoulder to improve tolerance with ADLs and reduced symptoms with activities.     Baseline  8/8: 9/10 9/4: 7-8/10 10/8: 7/10     Time  2    Period  Weeks    Status  Achieved        PT Long Term Goals - 02/24/18 1733      PT LONG TERM GOAL #1   Title  Patient will  report a worst pain of 5/10 on VAS in    R shoulder         to improve tolerance with ADLs and reduced symptoms with activities.     Baseline  8/8: 9/10 9/4: 7-8/10 10/7: 7/10     Time  4    Period  Weeks    Status  Partially Met    Target Date  03/24/18      PT LONG TERM GOAL #2   Title  Patient will improve shoulder AROM to > 140 degrees of flexion, scaption, and abduction for improved ability to perform overhead activities.    Baseline  8/8: AROM standing: flexion 95, abduction 99 9/4: flexion 110 painful  abduction: 106 painful 10/7: R flexion 116 abduction 128    Time  4    Period  Weeks    Status  Partially Met    Target Date  03/24/18      PT LONG TERM GOAL #3   Title  Patient will decrease Quick DASH score by > 8 points (gross 78%, work 92%) demonstrating reduced self-reported upper extremity disability.    Baseline  8/8: 86% work 100% 9/5:  56.8%, work 75%   (Pended)     Time  4    Period  Weeks    Status  Achieved      PT LONG TERM GOAL #4   Title  Patient will demonstrate 4/5 strength in RUE to allow patient to return to work and perform job duties without compensations.     Baseline  8/8: strength limited by ROM and pain 9/4: abduction and flexion 2+/5 due to pain 10/7: 2+/5 abduction and flexion due to pain    Time  4    Period  Weeks    Status  On-going    Target Date  03/24/18      PT LONG TERM GOAL #5   Title  Patient will decrease Quick DASH score by > 8 points (gross 48.8%, work 67%) demonstrating reduced self-reported upper extremity disability.     Baseline  9/4: 56.8%, work 75%; 10/7: general:  61.4%  work 100%   (Pended)     Time  4    Period  Weeks    Status  Partially Met  (Pended)     Target Date  03/24/18            Plan - 02/25/18 0817    Clinical Impression Statement  Patient scores on QuickDash affected by current restrictions on mobility due to weight bearing status. Due to new changes in mobility patient's pain levels have had a slight increase however continue to have a downward trend. Active ROM of RUE is improving in abduction and flexion with decreased episodes of shoulder hike at end range. Patient continues to have a pain centric mindset and is frustrated by pain levels and affect of pain on her sleep.Patient's condition has the potential to improve in response to therapy. Maximum improvement is yet to be obtained. The anticipated improvement is attainable and reasonable in a generally predictable time. Patient will continue to benefit from skilled physical therapy for improvements in shoulder ROM, strength, and decreased pain.     Rehab Potential  Fair    Clinical Impairments Affecting Rehab Potential  (+) age, educational level (-) secondary injury     PT Frequency  2x / week    PT Duration  4 weeks    PT Treatment/Interventions  ADLs/Self Care Home Management;Aquatic Therapy;Cryotherapy;Electrical Stimulation;Parrafin;Ultrasound;Traction;Moist Heat;Iontophoresis  4m/ml Dexamethasone;Functional mobility training;Therapeutic activities;Therapeutic exercise;Patient/family education;Neuromuscular re-education;Manual techniques;Manual lymph drainage;Compression bandaging;Taping;Energy conservation;Dry needling;Passive range of motion;Scar mobilization    PT Next Visit Plan  PNF, introduce active mobility     PT Home Exercise Plan  see sheet    Consulted and Agree with Plan of Care  Patient       Patient will benefit from skilled therapeutic intervention in order to improve the following deficits and impairments:   Decreased activity tolerance, Decreased coordination, Decreased endurance, Decreased mobility, Decreased range of motion, Decreased strength, Decreased scar mobility, Hypomobility, Increased edema, Impaired flexibility, Impaired perceived functional ability, Increased muscle spasms, Impaired UE functional use, Postural dysfunction, Improper body mechanics, Pain  Visit Diagnosis: Acute pain of right shoulder  Muscle weakness (generalized)     Problem List There are no active problems to display for this patient.  MJanna Arch PT, DPT    02/25/2018, 8:19 AM  [facility] [address] [address]Phone: [phone]  Fax:  [phone] Name: Charlene FREIMUTHMRN: [id number] of Birth: 130-Jun-1972

## 2018-02-25 ENCOUNTER — Ambulatory Visit
Admission: RE | Admit: 2018-02-25 | Discharge: 2018-02-25 | Disposition: A | Discharge status: Home / Self Care | Payer: 59 | Source: Ambulatory Visit | Attending: Orthopedic Surgery | Admitting: Orthopedic Surgery

## 2018-02-25 DIAGNOSIS — M67929 Unspecified disorder of synovium and tendon, unspecified upper arm: Secondary | ICD-10-CM | POA: Diagnosis present

## 2018-02-25 DIAGNOSIS — M67921 Unspecified disorder of synovium and tendon, right upper arm: Secondary | ICD-10-CM | POA: Insufficient documentation

## 2018-02-25 DIAGNOSIS — M7551 Bursitis of right shoulder: Secondary | ICD-10-CM | POA: Diagnosis not present

## 2018-02-26 ENCOUNTER — Ambulatory Visit: Payer: 59

## 2018-02-26 DIAGNOSIS — M6281 Muscle weakness (generalized): Secondary | ICD-10-CM | POA: Diagnosis not present

## 2018-02-26 DIAGNOSIS — M25511 Pain in right shoulder: Secondary | ICD-10-CM

## 2018-02-26 DIAGNOSIS — M67921 Unspecified disorder of synovium and tendon, right upper arm: Secondary | ICD-10-CM | POA: Diagnosis not present

## 2018-02-26 NOTE — Therapy (Signed)
[place] MAIN [facility] SERVICES [address] [place], [place], [address] Phone: [phone]   Fax:  [phone]  Physical Therapy Treatment  Patient Details  Name: Charlene Daniels MRN: [id number] Date of Birth: [birth year] No data recorded  Encounter Date: 02/26/2018  PT End of Session - 02/26/18 1652    Visit Number  14    Number of Visits  21    Date for PT Re-Evaluation  03/24/18    Authorization Type  7/10   (next 1/10 starting 02/24/18)      PT Start Time  1645    PT Stop Time  1730    PT Time Calculation (min)  45 min    Activity Tolerance  Patient limited by pain;Patient tolerated treatment well    Behavior During Therapy  [facility] for tasks assessed/performed       Past Medical History:  Diagnosis Date  . Arthritis   . Chronic kidney disease    on meds to protect kidney from DM  . Diabetes mellitus without complication ([place])   . GERD (gastroesophageal reflux disease)   . Headache(784.0)   . PONV (postoperative nausea and vomiting)     Past Surgical History:  Procedure Laterality Date  . ABDOMINAL HYSTERECTOMY  2009  . CHOLECYSTECTOMY  1994  . DILATION AND CURETTAGE OF UTERUS  1994  . LYMPH NODE DISSECTION Right 1999   right neck cervical LN dissection after Cat-scratch fever  . SHOULDER ARTHROSCOPY WITH OPEN ROTATOR CUFF REPAIR Right 09/19/2017   Procedure: SHOULDER ARTHROSCOPY WITH OPEN ROTATOR CUFF REPAIR & SUBACROMINAL DECOMPRESSION.;  Surgeon: Thornton Park, MD;  Location: [facility];  Service: Orthopedics;  Laterality: Right;  . SHOULDER ARTHROSCOPY WITH ROTATOR CUFF REPAIR AND SUBACROMIAL DECOMPRESSION Left 02/12/2013   Procedure: LEFT SHOULDER ARTHROSCOPY WITH SUBACROMIAL DECOMPRESSION, DISTAL CLAVICLE RESECTION, POSSIBLE BICEP TENOTOMY, POSSIBLE ROTATOR CUFF REPAIR;  Surgeon: Marin Shutter, MD;  Location: [facility];  Service: Orthopedics;  Laterality: Left;  . TONSILLECTOMY  1998    There were no vitals filed for this  visit.  Subjective Assessment - 02/26/18 1650    Subjective  Patient had an MRI and saw doctor today. Patient reports she is going to get surgery on November 7th to clean out scar tissue. May potentially have bicep re-attachment surgery as well during surgery depending upon level of scar tissue within bicep.     Pertinent History  atient was 8 weeks out right shoulder arthroscopy with mini open rotator cuff repair (performed 5/2)  when she moved her waist band feeling like "her arm had been ripped off". Since this moment on has not been out of pain. Went back to PT and her therapist felt like it had been retorn since she was not able to move arm. Mack Guise said MRI was false positive on tear, wanted pt to go to neurology thinking pain is from neck.  Was thinking it potentially could be complex regional pain syndrome. Patient wanted second opinion so went to [facility] in [place] to [employer]. Was not torn, anchor is lifted, where the repair is was not torn. Prior rotator cuff repair. Severe tendinosis of the supraspinatus tendon with a high-grade partial-thickness articular surface tear with a possible small full-thickness component. Severe tendinosis of the infraspinatus tendon.. Finished up steroid taper form Kransinski which helped with numb and ache.   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 Still not sleeping in bed , wants to get out of recliner. Has not had PT  for 10 days. Patient has lymphedema on R  Side.     Limitations  Reading;Lifting;Sitting;Walking;House hold activities;Other (comment)    Diagnostic tests  MRI: Prior rotator cuff repair. Severe tendinosis of the    Patient Stated Goals  to reduce pain, return to work duties, improve ROM and strength     Currently in Pain?  Yes    Pain Score  4     Pain Location  Shoulder    Pain Orientation  Right    Pain Descriptors / Indicators  Aching    Pain Type  Acute pain    Pain Onset  More than a month ago    Pain Frequency  Constant       Patient reports she is going to get surgery on November 7th to clean out scar tissue. May potentially have bicep re-attachment surgery as well during surgery depending upon level of scar tissue within bicep.     Seated: Scapular retractions via bringing arms from needs to hips to increase ROM and upright posture x 20  AAROM alphabet with UE ranger AAROM circles 10x clockwise, 10x counterclockwise with ranger cues for widening range and slowing velocity Scapular mobilizations: AP 10x, PA: 10x, upward rotation downward rotation 10x (PROM) improved mobility with repetition   Supine: wedge with roll under legs Distraction with rhythmic rotation 60 seconds x 5 trials PROM flexion 8x 10 second holds, abduction 8x 10 second holds, ER 13x 10 second holds, IR 8x 10 second holds Bicep lengthening with movement x 4 minutes PNF1 modified with PT assistance x15 PNF 2 modified with PT assistance x 15 . STM anterior bicep/GH joint/ subscap with focus on pectoral musculature x 8 minutes Rotational mobilizations of shoulder with three point hold, focus on AP and inferior mobilization                        PT Education - 02/26/18 1652    Education Details  exercise technique, manual     Person(s) Educated  Patient    Methods  Explanation;Demonstration;Verbal cues    Comprehension  Verbalized understanding;Returned demonstration       PT Short Term Goals - 02/25/18 0817      PT SHORT TERM GOAL #1   Title  Patient will be independent in home exercise program to improve strength/mobility for better functional independence with ADLs.    Baseline  HEP given 9/4 compliant 10/8: compliant    Time  2    Period  Weeks    Status  Partially Met      PT SHORT TERM GOAL #2   Title  Patient will report a worst pain of 7/10 on VAS in  R shoulder to improve tolerance with ADLs and reduced symptoms with activities.     Baseline  8/8: 9/10 9/4: 7-8/10 10/8: 7/10     Time  2    Period   Weeks    Status  Achieved        PT Long Term Goals - 02/24/18 1733      PT LONG TERM GOAL #1   Title  Patient will report a worst pain of 5/10 on VAS in    R shoulder         to improve tolerance with ADLs and reduced symptoms with activities.     Baseline  8/8: 9/10 9/4: 7-8/10 10/7: 7/10     Time  4    Period  Weeks    Status  Partially Met    Target Date  03/24/18      PT LONG TERM GOAL #2   Title  Patient will improve shoulder AROM to > 140 degrees of flexion, scaption, and abduction for improved ability to perform overhead activities.    Baseline  8/8: AROM standing: flexion 95, abduction 99 9/4: flexion 110 painful  abduction: 106 painful 10/7: R flexion 116 abduction 128    Time  4    Period  Weeks    Status  Partially Met    Target Date  03/24/18      PT LONG TERM GOAL #3   Title  Patient will decrease Quick DASH score by > 8 points (gross 78%, work 92%) demonstrating reduced self-reported upper extremity disability.    Baseline  8/8: 86% work 100% 9/5:  56.8%, work 75%   (Pended)     Time  4    Period  Weeks    Status  Achieved      PT LONG TERM GOAL #4   Title  Patient will demonstrate 4/5 strength in RUE to allow patient to return to work and perform job duties without compensations.     Baseline  8/8: strength limited by ROM and pain 9/4: abduction and flexion 2+/5 due to pain 10/7: 2+/5 abduction and flexion due to pain    Time  4    Period  Weeks    Status  On-going    Target Date  03/24/18      PT LONG TERM GOAL #5   Title  Patient will decrease Quick DASH score by > 8 points (gross 48.8%, work 67%) demonstrating reduced self-reported upper extremity disability.    Baseline  9/4: 56.8%, work 75%; 10/7: general:  61.4%  work 100%   (Pended)     Time  4    Period  Weeks    Status  Partially Met  (Pended)     Target Date  03/24/18            Plan - 02/27/18 0837    Clinical Impression Statement  Patient continues to be limited by pain focused  mindset. Patient will be receiving surgery Nov 7th per patient report for scar tissue clean up and potential bicep re-attachment. Patient pain decreased to 3/10 at end of session with distraction and mobilizations to decrease muscle tension and guarding.Patient will continue to benefit from skilled physical therapy for improvements in shoulder ROM, strength, and decreased pain.     Rehab Potential  Fair    Clinical Impairments Affecting Rehab Potential  (+) age, educational level (-) secondary injury     PT Frequency  2x / week    PT Duration  4 weeks    PT Treatment/Interventions  ADLs/Self Care Home Management;Aquatic Therapy;Cryotherapy;Electrical Stimulation;Parrafin;Ultrasound;Traction;Moist Heat;Iontophoresis 8m/ml Dexamethasone;Functional mobility training;Therapeutic activities;Therapeutic exercise;Patient/family education;Neuromuscular re-education;Manual techniques;Manual lymph drainage;Compression bandaging;Taping;Energy conservation;Dry needling;Passive range of motion;Scar mobilization    PT Next Visit Plan  PNF, introduce active mobility     PT Home Exercise Plan  see sheet    Consulted and Agree with Plan of Care  Patient       Patient will benefit from skilled therapeutic intervention in order to improve the following deficits and impairments:  Decreased activity tolerance, Decreased coordination, Decreased endurance, Decreased mobility, Decreased range of motion, Decreased strength, Decreased scar mobility, Hypomobility, Increased edema, Impaired flexibility, Impaired perceived functional ability, Increased muscle spasms, Impaired UE functional use, Postural dysfunction, Improper body mechanics, Pain  Visit Diagnosis: Acute pain  of right shoulder  Muscle weakness (generalized)     Problem List There are no active problems to display for this patient.  Janna Arch, PT, DPT    02/27/2018, 8:38 AM  [place] MAIN [facility] SERVICES [address] [place], [place], [address] Phone: [phone]   Fax:  [phone]  Name: LAMEES GABLE MRN: [id number] Date of Birth: 1971-04-17

## 2018-03-03 ENCOUNTER — Ambulatory Visit: Payer: 59

## 2018-03-03 DIAGNOSIS — M6281 Muscle weakness (generalized): Secondary | ICD-10-CM | POA: Diagnosis not present

## 2018-03-03 DIAGNOSIS — M25511 Pain in right shoulder: Secondary | ICD-10-CM

## 2018-03-03 NOTE — Therapy (Signed)
[place] MAIN [facility] SERVICES [address] [place], [place], [address] Phone: [phone]   Fax:  [phone]  Physical Therapy Treatment  Patient Details  Name: Charlene Daniels MRN: [id number] Date of Birth: 01-09-1971 No data recorded  Encounter Date: 03/03/2018  PT End of Session - 03/03/18 1738    Visit Number  15    Number of Visits  21    Date for PT Re-Evaluation  03/24/18    Authorization Type  2/10 starting 02/24/18    PT Start Time  1645    PT Stop Time  1729    PT Time Calculation (min)  44 min    Activity Tolerance  Patient limited by pain;Patient tolerated treatment well    Behavior During Therapy  [facility] for tasks assessed/performed       Past Medical History:  Diagnosis Date  . Arthritis   . Chronic kidney disease    on meds to protect kidney from DM  . Diabetes mellitus without complication ([place])   . GERD (gastroesophageal reflux disease)   . Headache(784.0)   . PONV (postoperative nausea and vomiting)     Past Surgical History:  Procedure Laterality Date  . ABDOMINAL HYSTERECTOMY  2009  . CHOLECYSTECTOMY  1994  . DILATION AND CURETTAGE OF UTERUS  1994  . LYMPH NODE DISSECTION Right 1999   right neck cervical LN dissection after Cat-scratch fever  . SHOULDER ARTHROSCOPY WITH OPEN ROTATOR CUFF REPAIR Right 09/19/2017   Procedure: SHOULDER ARTHROSCOPY WITH OPEN ROTATOR CUFF REPAIR & SUBACROMINAL DECOMPRESSION.;  Surgeon: Thornton Park, MD;  Location: [facility];  Service: Orthopedics;  Laterality: Right;  . SHOULDER ARTHROSCOPY WITH ROTATOR CUFF REPAIR AND SUBACROMIAL DECOMPRESSION Left 02/12/2013   Procedure: LEFT SHOULDER ARTHROSCOPY WITH SUBACROMIAL DECOMPRESSION, DISTAL CLAVICLE RESECTION, POSSIBLE BICEP TENOTOMY, POSSIBLE ROTATOR CUFF REPAIR;  Surgeon: Marin Shutter, MD;  Location: [place];  Service: Orthopedics;  Laterality: Left;  . TONSILLECTOMY  1998    There were no vitals filed for this visit.  Subjective  Assessment - 03/03/18 1735    Subjective  Patient presents with fatigue after a long day. Patient has been compliant with HEP over the weekend.     Pertinent History  atient was 8 weeks out right shoulder arthroscopy with mini open rotator cuff repair (performed 5/2)  when she moved her waist band feeling like "her arm had been ripped off". Since this moment on has not been out of pain. Went back to PT and her therapist felt like it had been retorn since she was not able to move arm. Mack Guise said MRI was false positive on tear, wanted pt to go to neurology thinking pain is from neck.  Was thinking it potentially could be complex regional pain syndrome. Patient wanted second opinion so went to [facility] in [place] to [employer]. Was not torn, anchor is lifted, where the repair is was not torn. Prior rotator cuff repair. Severe tendinosis of the supraspinatus tendon with a high-grade partial-thickness articular surface tear with a possible small full-thickness component. Severe tendinosis of the infraspinatus tendon.. Finished up steroid taper form Kransinski which helped with numb and ache.   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 Still not sleeping in bed , wants to get out of recliner. Has not had PT for 10 days. Patient has lymphedema on R  Side.     Limitations  Reading;Lifting;Sitting;Walking;House hold activities;Other (comment)    Diagnostic tests  MRI: Prior rotator cuff repair.  Severe tendinosis of the    Patient Stated Goals  to reduce pain, return to work duties, improve ROM and strength     Currently in Pain?  Yes    Pain Score  4     Pain Location  Shoulder    Pain Orientation  Right    Pain Descriptors / Indicators  Aching    Pain Type  Acute pain    Pain Onset  More than a month ago    Pain Frequency  Constant       UBE backwards 4 minutes Lvl 2 for muscle contraction and capacity increase for improved posture   Seated: Scapular retractions via bringing arms from needs to  hips to increase ROM and upright posture x 20  AAROM alphabet with UE ranger AAROM circles 10x clockwise, 10x counterclockwise with ranger cues for widening range and slowing velocity Scapular mobilizations: AP 10x, PA: 10x, upward rotation downward rotation 10x (PROM) improved mobility with repetition   Supine: wedge with roll under legs Distraction with rhythmic rotation 60 seconds x 5 trials PROM flexion 8x 10 second holds, abduction 8x 10 second holds, ER 13x 10 second holds, IR 8x 10 second holds Bicep lengthening with movement x 4 minutes PNF1 modified with PT assistance x15 PNF 2 modified with PT assistance x 15 . STM anterior bicep/GH joint/ subscap with focus on pectoral musculature x 8 minutes Rotational mobilizations of shoulder with three point hold, focus on AP and inferior mobilization     .                   PT Education - 03/03/18 1735    Education Details  exercise technique, manual     Person(s) Educated  Patient    Methods  Explanation;Verbal cues    Comprehension  Verbalized understanding;Returned demonstration       PT Short Term Goals - 02/25/18 0817      PT SHORT TERM GOAL #1   Title  Patient will be independent in home exercise program to improve strength/mobility for better functional independence with ADLs.    Baseline  HEP given 9/4 compliant 10/8: compliant    Time  2    Period  Weeks    Status  Partially Met      PT SHORT TERM GOAL #2   Title  Patient will report a worst pain of 7/10 on VAS in  R shoulder to improve tolerance with ADLs and reduced symptoms with activities.     Baseline  8/8: 9/10 9/4: 7-8/10 10/8: 7/10     Time  2    Period  Weeks    Status  Achieved        PT Long Term Goals - 02/24/18 1733      PT LONG TERM GOAL #1   Title  Patient will report a worst pain of 5/10 on VAS in    R shoulder         to improve tolerance with ADLs and reduced symptoms with activities.     Baseline  8/8: 9/10 9/4: 7-8/10 10/7:  7/10     Time  4    Period  Weeks    Status  Partially Met    Target Date  03/24/18      PT LONG TERM GOAL #2   Title  Patient will improve shoulder AROM to > 140 degrees of flexion, scaption, and abduction for improved ability to perform overhead activities.    Baseline  8/8:  AROM standing: flexion 95, abduction 99 9/4: flexion 110 painful  abduction: 106 painful 10/7: R flexion 116 abduction 128    Time  4    Period  Weeks    Status  Partially Met    Target Date  03/24/18      PT LONG TERM GOAL #3   Title  Patient will decrease Quick DASH score by > 8 points (gross 78%, work 92%) demonstrating reduced self-reported upper extremity disability.    Baseline  8/8: 86% work 100% 9/5:  56.8%, work 75%   (Pended)     Time  4    Period  Weeks    Status  Achieved      PT LONG TERM GOAL #4   Title  Patient will demonstrate 4/5 strength in RUE to allow patient to return to work and perform job duties without compensations.     Baseline  8/8: strength limited by ROM and pain 9/4: abduction and flexion 2+/5 due to pain 10/7: 2+/5 abduction and flexion due to pain    Time  4    Period  Weeks    Status  On-going    Target Date  03/24/18      PT LONG TERM GOAL #5   Title  Patient will decrease Quick DASH score by > 8 points (gross 48.8%, work 67%) demonstrating reduced self-reported upper extremity disability.    Baseline  9/4: 56.8%, work 75%; 10/7: general:  61.4%  work 100%   (Pended)     Time  4    Period  Weeks    Status  Partially Met  (Pended)     Target Date  03/24/18            Plan - 03/03/18 1827    Clinical Impression Statement  Patient presented initially with shoulder hiking and guarding of R shoulder, after extensive manual and exercise patient demonstrated equal shoulder height with decreased tension and pain reduction of 2 points. Patient scapula is mobilizing better with decreased hypomobility. Patient will continue to benefit from skilled physical therapy for  improvements in shoulder ROM, strength, and decreased pain    Rehab Potential  Fair    Clinical Impairments Affecting Rehab Potential  (+) age, educational level (-) secondary injury     PT Frequency  2x / week    PT Duration  4 weeks    PT Treatment/Interventions  ADLs/Self Care Home Management;Aquatic Therapy;Cryotherapy;Electrical Stimulation;Parrafin;Ultrasound;Traction;Moist Heat;Iontophoresis 71m/ml Dexamethasone;Functional mobility training;Therapeutic activities;Therapeutic exercise;Patient/family education;Neuromuscular re-education;Manual techniques;Manual lymph drainage;Compression bandaging;Taping;Energy conservation;Dry needling;Passive range of motion;Scar mobilization    PT Next Visit Plan  PNF, introduce active mobility     PT Home Exercise Plan  see sheet    Consulted and Agree with Plan of Care  Patient       Patient will benefit from skilled therapeutic intervention in order to improve the following deficits and impairments:  Decreased activity tolerance, Decreased coordination, Decreased endurance, Decreased mobility, Decreased range of motion, Decreased strength, Decreased scar mobility, Hypomobility, Increased edema, Impaired flexibility, Impaired perceived functional ability, Increased muscle spasms, Impaired UE functional use, Postural dysfunction, Improper body mechanics, Pain  Visit Diagnosis: Acute pain of right shoulder  Muscle weakness (generalized)     Problem List There are no active problems to display for this patient.  MJanna Arch PT, DPT   03/03/2018, 6:27 PM  [place] [facility] [address] [address]Phone: [phone]  Fax:  [phone] Name:  KAITYLN KALLSTROM MRN: [id number] Date of Birth: 1970-07-27

## 2018-03-05 ENCOUNTER — Ambulatory Visit: Payer: 59

## 2018-03-05 DIAGNOSIS — M6281 Muscle weakness (generalized): Secondary | ICD-10-CM | POA: Diagnosis not present

## 2018-03-05 DIAGNOSIS — M25511 Pain in right shoulder: Secondary | ICD-10-CM

## 2018-03-05 NOTE — Therapy (Signed)
[place] MAIN [facility] SERVICES [address] [place], [place], [address] Phone: [phone]   Fax:  [phone]  Physical Therapy Treatment  Patient Details  Name: Charlene Daniels MRN: [id number] Date of Birth: 13-Aug-1970 No data recorded  Encounter Date: 03/05/2018  PT End of Session - 03/05/18 1651    Visit Number  16    Number of Visits  21    Date for PT Re-Evaluation  03/24/18    Authorization Type  3/10 starting 02/24/18    PT Start Time  1645    PT Stop Time  1728    PT Time Calculation (min)  43 min    Activity Tolerance  Patient limited by pain;Patient tolerated treatment well    Behavior During Therapy  [facility] for tasks assessed/performed       Past Medical History:  Diagnosis Date  . Arthritis   . Chronic kidney disease    on meds to protect kidney from DM  . Diabetes mellitus without complication ([place])   . GERD (gastroesophageal reflux disease)   . Headache(784.0)   . PONV (postoperative nausea and vomiting)     Past Surgical History:  Procedure Laterality Date  . ABDOMINAL HYSTERECTOMY  2009  . CHOLECYSTECTOMY  1994  . DILATION AND CURETTAGE OF UTERUS  1994  . LYMPH NODE DISSECTION Right 1999   right neck cervical LN dissection after Cat-scratch fever  . SHOULDER ARTHROSCOPY WITH OPEN ROTATOR CUFF REPAIR Right 09/19/2017   Procedure: SHOULDER ARTHROSCOPY WITH OPEN ROTATOR CUFF REPAIR & SUBACROMINAL DECOMPRESSION.;  Surgeon: Thornton Park, MD;  Location: [facility] ORS;  Service: Orthopedics;  Laterality: Right;  . SHOULDER ARTHROSCOPY WITH ROTATOR CUFF REPAIR AND SUBACROMIAL DECOMPRESSION Left 02/12/2013   Procedure: LEFT SHOULDER ARTHROSCOPY WITH SUBACROMIAL DECOMPRESSION, DISTAL CLAVICLE RESECTION, POSSIBLE BICEP TENOTOMY, POSSIBLE ROTATOR CUFF REPAIR;  Surgeon: Marin Shutter, MD;  Location: [facility];  Service: Orthopedics;  Laterality: Left;  . TONSILLECTOMY  1998    There were no vitals filed for this visit.  Subjective  Assessment - 03/05/18 1648    Subjective  Patient reports having a high pain day today, potentially due to the weather. Almost went home earlier today due to the pain.     Pertinent History  atient was 8 weeks out right shoulder arthroscopy with mini open rotator cuff repair (performed 5/2)  when she moved her waist band feeling like "her arm had been ripped off". Since this moment on has not been out of pain. Went back to PT and her therapist felt like it had been retorn since she was not able to move arm. Mack Guise said MRI was false positive on tear, wanted pt to go to neurology thinking pain is from neck.  Was thinking it potentially could be complex regional pain syndrome. Patient wanted second opinion so went to [facility] in [place] to [employer]. Was not torn, anchor is lifted, where the repair is was not torn. Prior rotator cuff repair. Severe tendinosis of the supraspinatus tendon with a high-grade partial-thickness articular surface tear with a possible small full-thickness component. Severe tendinosis of the infraspinatus tendon.. Finished up steroid taper form [place] which helped with numb and ache.   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 Still not sleeping in bed , wants to get out of recliner. Has not had PT for 10 days. Patient has lymphedema on R  Side.     Limitations  Reading;Lifting;Sitting;Walking;House hold activities;Other (comment)    Diagnostic tests  MRI: Prior rotator cuff repair. Severe tendinosis of the    Patient Stated Goals  to reduce pain, return to work duties, improve ROM and strength     Currently in Pain?  Yes    Pain Score  7     Pain Location  Shoulder    Pain Orientation  Right    Pain Descriptors / Indicators  Aching    Pain Type  Acute pain    Pain Onset  More than a month ago    Pain Frequency  Constant       UBE forward 2 minutes , backwards 2 minutes Lvl 2 for muscle contraction and capacity increase for improved posture    Seated: Scapular  retractions via bringing arms from needs to hips to increase ROM and upright posture x 20   AAROM alphabet with UE ranger; 2 sets  AAROM circles 10x clockwise, 10x counterclockwise with ranger cues for widening range and slowing velocity; 2 sets  Scapular mobilizations: AP 10x, PA: 10x, upward rotation downward rotation 10x (PROM) improved mobility with repetition   Supine: wedge with roll under legs Distraction with rhythmic rotation 60 seconds x 5 trials PROM flexion 8x 10 second holds, abduction 8x 10 second holds, ER 13x 10 second holds, IR 8x 10 second holds Bicep lengthening with movement x 4 minutes  STM anterior bicep/GH joint/ subscap with focus on pectoral musculature x 8 minutes Rotational mobilizations of shoulder with three point hold, focus on AP and inferior mobilization .                            PT Education - 03/05/18 1651    Education Details  exercise technique, manual, pain control     Person(s) Educated  Patient    Methods  Explanation;Demonstration;Verbal cues    Comprehension  Verbalized understanding;Returned demonstration       PT Short Term Goals - 02/25/18 0817      PT SHORT TERM GOAL #1   Title  Patient will be independent in home exercise program to improve strength/mobility for better functional independence with ADLs.    Baseline  HEP given 9/4 compliant 10/8: compliant    Time  2    Period  Weeks    Status  Partially Met      PT SHORT TERM GOAL #2   Title  Patient will report a worst pain of 7/10 on VAS in  R shoulder to improve tolerance with ADLs and reduced symptoms with activities.     Baseline  8/8: 9/10 9/4: 7-8/10 10/8: 7/10     Time  2    Period  Weeks    Status  Achieved        PT Long Term Goals - 02/24/18 1733      PT LONG TERM GOAL #1   Title  Patient will report a worst pain of 5/10 on VAS in    R shoulder         to improve tolerance with ADLs and reduced symptoms with activities.     Baseline   8/8: 9/10 9/4: 7-8/10 10/7: 7/10     Time  4    Period  Weeks    Status  Partially Met    Target Date  03/24/18      PT LONG TERM GOAL #2   Title  Patient will improve shoulder AROM to > 140 degrees of flexion, scaption, and abduction for improved ability  to perform overhead activities.    Baseline  8/8: AROM standing: flexion 95, abduction 99 9/4: flexion 110 painful  abduction: 106 painful 10/7: R flexion 116 abduction 128    Time  4    Period  Weeks    Status  Partially Met    Target Date  03/24/18      PT LONG TERM GOAL #3   Title  Patient will decrease Quick DASH score by > 8 points (gross 78%, work 92%) demonstrating reduced self-reported upper extremity disability.    Baseline  8/8: 86% work 100% 9/5:  56.8%, work 75%   (Pended)     Time  4    Period  Weeks    Status  Achieved      PT LONG TERM GOAL #4   Title  Patient will demonstrate 4/5 strength in RUE to allow patient to return to work and perform job duties without compensations.     Baseline  8/8: strength limited by ROM and pain 9/4: abduction and flexion 2+/5 due to pain 10/7: 2+/5 abduction and flexion due to pain    Time  4    Period  Weeks    Status  On-going    Target Date  03/24/18      PT LONG TERM GOAL #5   Title  Patient will decrease Quick DASH score by > 8 points (gross 48.8%, work 67%) demonstrating reduced self-reported upper extremity disability.    Baseline  9/4: 56.8%, work 75%; 10/7: general:  61.4%  work 100%   (Pended)     Time  4    Period  Weeks    Status  Partially Met  (Pended)     Target Date  03/24/18            Plan - 03/06/18 0810    Clinical Impression Statement   Patient has high pain levels today limiting her capacity for mobility and tolerance of activity. mobization with scapular retraction.  AAROM and manual reduced pain by two levels. Patient will continue to benefit from skilled physical therapy for improvements in shoulder ROM, strength, and decreased pain    Rehab  Potential  Fair    Clinical Impairments Affecting Rehab Potential  (+) age, educational level (-) secondary injury     PT Frequency  2x / week    PT Duration  4 weeks    PT Treatment/Interventions  ADLs/Self Care Home Management;Aquatic Therapy;Cryotherapy;Electrical Stimulation;Parrafin;Ultrasound;Traction;Moist Heat;Iontophoresis 83m/ml Dexamethasone;Functional mobility training;Therapeutic activities;Therapeutic exercise;Patient/family education;Neuromuscular re-education;Manual techniques;Manual lymph drainage;Compression bandaging;Taping;Energy conservation;Dry needling;Passive range of motion;Scar mobilization    PT Next Visit Plan  PNF, introduce active mobility     PT Home Exercise Plan  see sheet    Consulted and Agree with Plan of Care  Patient       Patient will benefit from skilled therapeutic intervention in order to improve the following deficits and impairments:  Decreased activity tolerance, Decreased coordination, Decreased endurance, Decreased mobility, Decreased range of motion, Decreased strength, Decreased scar mobility, Hypomobility, Increased edema, Impaired flexibility, Impaired perceived functional ability, Increased muscle spasms, Impaired UE functional use, Postural dysfunction, Improper body mechanics, Pain  Visit Diagnosis: Acute pain of right shoulder  Muscle weakness (generalized)     Problem List There are no active problems to display for this patient.  MJanna Arch PT, DPT   03/06/2018, 8:11 AM  [place] [facility] [address] [place] [address]Phone: [phone]  Fax:  [phone]  Name: MARYLAND STELL MRN: [id number] Date of Birth: 1970-12-11

## 2018-03-06 ENCOUNTER — Inpatient Hospital Stay: Payer: 59 | Admitting: Genetics

## 2018-03-06 ENCOUNTER — Inpatient Hospital Stay: Payer: 59 | Attending: Oncology

## 2018-03-06 ENCOUNTER — Inpatient Hospital Stay (HOSPITAL_BASED_OUTPATIENT_CLINIC_OR_DEPARTMENT_OTHER): Payer: 59 | Admitting: Genetics

## 2018-03-06 ENCOUNTER — Encounter: Payer: Self-pay | Admitting: Genetics

## 2018-03-06 DIAGNOSIS — Z803 Family history of malignant neoplasm of breast: Secondary | ICD-10-CM | POA: Diagnosis not present

## 2018-03-06 DIAGNOSIS — Z800 Family history of malignant neoplasm of digestive organs: Secondary | ICD-10-CM

## 2018-03-06 DIAGNOSIS — Z808 Family history of malignant neoplasm of other organs or systems: Secondary | ICD-10-CM | POA: Diagnosis not present

## 2018-03-06 DIAGNOSIS — Z8041 Family history of malignant neoplasm of ovary: Secondary | ICD-10-CM | POA: Diagnosis not present

## 2018-03-06 HISTORY — DX: Family history of malignant neoplasm of ovary: Z80.41

## 2018-03-06 HISTORY — DX: Family history of malignant neoplasm of breast: Z80.3

## 2018-03-06 NOTE — Progress Notes (Signed)
REFERRING PROVIDER: Rusty Aus, MD [place] [facility] [place], [place] [address]  PRIMARY PROVIDER:  Rusty Aus, MD  PRIMARY REASON FOR VISIT:  1. Family history of breast cancer   2. Family history of ovarian cancer     HISTORY OF PRESENT ILLNESS:   Charlene Daniels, a 47 y.o. female, was seen for a [place] cancer genetics consultation at the request of Dr. Sabra Heck due to a family history of cancer.  Charlene Daniels presents to clinic today to discuss the possibility of a hereditary predisposition to cancer, genetic testing, and to further clarify her future cancer risks, as well as potential cancer risks for family members.   Charlene Daniels is a 47 y.o. female with no personal history of cancer.    ORMONAL RISK FACTORS:  Menarche was at age 83.  First live birth at age N/A.  OCP use for approximately 19-20 years.  Ovaries intact: no.  Hysterectomy: yes. 2009 Menopausal status: postmenopausal.  HRT use: 9 years. Colonoscopy: no; not examined. Mammogram within the last year: yes. Number of breast biopsies: had 1 superficial cyst removed in the 1990's.  Past Medical History:  Diagnosis Date  . Arthritis   . Chronic kidney disease    on meds to protect kidney from DM  . Diabetes mellitus without complication ([place])   . Family history of breast cancer 03/06/2018  . Family history of ovarian cancer 03/06/2018  . GERD (gastroesophageal reflux disease)   . Headache(784.0)   . PONV (postoperative nausea and vomiting)     Past Surgical History:  Procedure Laterality Date  . ABDOMINAL HYSTERECTOMY  2009  . CHOLECYSTECTOMY  1994  . DILATION AND CURETTAGE OF UTERUS  1994  . LYMPH NODE DISSECTION Right 1999   right neck cervical LN dissection after Cat-scratch fever  . SHOULDER ARTHROSCOPY WITH OPEN ROTATOR CUFF REPAIR Right 09/19/2017   Procedure: SHOULDER ARTHROSCOPY WITH OPEN ROTATOR CUFF REPAIR & SUBACROMINAL DECOMPRESSION.;  Surgeon: Thornton Park, MD;  Location: [facility] ORS;  Service: Orthopedics;  Laterality: Right;  . SHOULDER ARTHROSCOPY WITH ROTATOR CUFF REPAIR AND SUBACROMIAL DECOMPRESSION Left 02/12/2013   Procedure: LEFT SHOULDER ARTHROSCOPY WITH SUBACROMIAL DECOMPRESSION, DISTAL CLAVICLE RESECTION, POSSIBLE BICEP TENOTOMY, POSSIBLE ROTATOR CUFF REPAIR;  Surgeon: Marin Shutter, MD;  Location: [facility];  Service: Orthopedics;  Laterality: Left;  . TONSILLECTOMY  1998    Social History   Socioeconomic History  . Marital status: Married    Spouse name: Not on file  . Number of children: Not on file  . Years of education: Not on file  . Highest education level: Not on file  Occupational History  . Not on file  Social Needs  . Financial resource strain: Not on file  . Food insecurity:    Worry: Not on file    Inability: Not on file  . Transportation needs:    Medical: Not on file    Non-medical: Not on file  Tobacco Use  . Smoking status: Never Smoker  . Smokeless tobacco: Never Used  Substance and Sexual Activity  . Alcohol use: No  . Drug use: No  . Sexual activity: Not on file  Lifestyle  . Physical activity:    Days per week: Not on file    Minutes per session: Not on file  . Stress: Not on file  Relationships  . Social connections:    Talks on phone: Not on file    Gets together: Not on file  Attends religious service: Not on file    Active member of club or organization: Not on file    Attends meetings of clubs or organizations: Not on file    Relationship status: Not on file  Other Topics Concern  . Not on file  Social History Narrative  . Not on file     FAMILY HISTORY:  We obtained a detailed, 4-generation family history.  Significant diagnoses are listed below: Family History  Problem Relation Age of Onset  . Ovarian cancer Mother 22  . Esophageal cancer Father 1  . Breast cancer Maternal Aunt 7    Charlene Daniels has no children.  She has a brother who is 68 with no hx of cancer.  He has  a son and a daughter both 55 (twins). Charlene Daniels also has a sister who is 68 with no hx of cancer, she as a daughter.   Charlene Daniels father: dx with esophageal cancer at 84. Paternal Aunts/Uncles: 1 paternal uncle is 34 with no hx of cancer.  Paternal cousins: no known hx of cancer Paternal grandfather: died at 18 with a hepatoma/liver cancer.  No hx of EtoH use.  Paternal grandmother:died at 65, stroke  Charlene Daniels's mother: dx with ovarian cancer at 10, died at 96. She had BRCA1/2 only testing in 1998.  (no panel and no BART).  Maternal Aunts/Uncles: 2 maternal aunts- 1 dx with breast cancer in her late 31's and died a few years after.  The other maternal aunt lived to be 40 with no hx of cancer.   Maternal cousins: no hx of cancer Maternal grandfather: died in his 32's of a heart attack.  Maternal grandmother:died at 56 due to complications of Lupus.  She had 2 nieces/nephews who had lung and esophageal cancer (hx of smoking)  Patient's maternal ancestors are of Caucasian/[place] descent, and paternal ancestors are of [place] descent. There is no reported Ashkenazi Jewish ancestry. There is no known consanguinity.  GENETIC COUNSELING ASSESSMENT: Charlene Daniels is a 47 y.o. female with a family history which is somewhat suggestive of a Hereditary Cancer Predisposition Syndrome. We, therefore, discussed and recommended the following at today's visit.   DISCUSSION: We reviewed the characteristics, features and inheritance patterns of hereditary cancer syndromes. We also discussed genetic testing, including the appropriate family members to test, the process of testing, insurance coverage and turn-around-time for results. We discussed the implications of a negative, positive and/or variant of uncertain significant result. We recommended Charlene Daniels pursue genetic testing for the Multi-Cancer gene panel.   The Multi-Cancer Panel offered by [facility] includes sequencing and/or deletion  duplication testing of the following 91 genes: AIP, ALK, APC, ATM, AXIN2, BAP1, BARD1, BLM, BMPR1A, BRCA1, BRCA2, BRIP1, BUB1B, CASR, CDC73, CDH1, CDK4, CDKN1B, CDKN1C, CDKN2A, CEBPA, CEP57, CHEK2, CTNNA1, DICER1, DIS3L2, EGFR, ENG, EPCAM, FH, FLCN, GALNT12, GATA2, GPC3, GREM1, HOXB13, HRAS, KIT, MAX, MEN1, MET, MITF, MLH1, MLH3, MSH2, MSH3, MSH6, MUTYH, NBN, NF1, NF2, NTHL1, PALB2, PDGFRA, PHOX2B, PMS2, POLD1, POLE, POT1, PRKAR1A, PTCH1, PTEN, RAD50, RAD51C, RAD51D, RB1, RECQL4, RET, RNF43, RPS20, RUNX1, SDHA, SDHAF2, SDHB, SDHC, SDHD, SMAD4, SMARCA4, SMARCB1, SMARCE1, STK11, SUFU, TERC, TERT, TMEM127, TP53, TSC1, TSC2, VHL, WRN, WT1  We discussed that only 5-10% of cancers are associated with a Hereditary cancer predisposition syndrome.  One of the most common hereditary cancer syndromes that increases breast cancer risk is called Hereditary Breast and Ovarian Cancer (HBOC) syndrome.  This syndrome is caused by mutations in the BRCA1 and BRCA2 genes.  This  syndrome increases an individual's lifetime risk to develop breast, ovarian, pancreatic, and other types of cancer.  Her mother tested negative for these gene mutations 20+ years ago.  However, technology has improved in its ability to detect certain types of mutations (BART) and there are now many other genes we now know of that impact ovarian/breast and other cancer risks.   There are many other cancer predisposition syndromes caused by mutations in several other genes.  We discussed that if she is found to have a mutation in one of these genes, it may impact future medical management recommendations such as increased cancer screenings and consideration of risk reducing surgeries.  A positive result could also have implications for the patient's family members.  A Negative result would mean we did not identify a  Hereditary predisposition to cancer in Charlene Daniels.  However, this would not rule out the possibility of a hereditary risk for cancer.  There  could be mutations that are undetectable by current technology, or in genes not yet tested or identified to increase cancer risk.    We discussed the potential to find a Variant of Uncertain Significance or VUS.  These are variants that have not yet been identified as pathogenic or benign, and it is unknown if this variant is associated with increased cancer risk or if this is a normal finding.  Most VUS's are reclassified to benign or likely benign.   It should not be used to make medical management decisions. With time, we suspect the lab will determine the significance of any VUS's identified if any.   Based on Charlene Daniels's family history of cancer, she meets medical criteria for genetic testing. Despite that she meets criteria, she may still have an out of pocket cost. The laboratory can provide her with an estimate of her OOP cost.  she was given the contact information for the laboratory if she has further questions.   Based on the patient's personal and family history, the statistical model (Tyrer Cusik)   Was used to estimate her risk of developing breast cancer. This estimates her lifetime risk of developing breast cancer to be approximately 11.7%. This estimation is performed in the setting negative genetic test results.  A positive result may significantly impact this risk assessment.  The patient's lifetime breast cancer risk is a preliminary estimate based on available information using one of several models endorsed by the [place] (ACS). The ACS recommends consideration of breast MRI screening as an adjunct to mammography for patients at high risk (defined as 20% or greater lifetime risk). A more detailed breast cancer risk assessment can be considered, if clinically indicated.     We discussed that some people do not want to undergo genetic testing due to fear of genetic discrimination.  A federal law called the Genetic Information Non-Discrimination Act (GINA) of 2008 helps  protect individuals against genetic discrimination based on their genetic test results.  It impacts both health insurance and employment.  For health insurance, it protects against increased premiums, being kicked off insurance or being forced to take a test in order to be insured.  For employment it protects against hiring, firing and promoting decisions based on genetic test results.  Health status due to a cancer diagnosis is not protected under GINA.  This law does not protect life insurance, disability insurance, or other types of insurance.   PLAN: After considering the risks, benefits, and limitations, Charlene Daniels  provided informed consent to pursue genetic testing  and the blood sample was sent to [facility] for analysis of the Multi-Cancer Panel. Results should be available within approximately 2-3 weeks' time, at which point they will be disclosed by telephone to Charlene Daniels, as will any additional recommendations warranted by these results. Charlene Daniels will receive a summary of her genetic counseling visit and a copy of her results once available. This information will also be available in Epic. We encouraged Charlene Daniels to remain in contact with cancer genetics annually so that we can continuously update the family history and inform her of any changes in cancer genetics and testing that may be of benefit for her family. Charlene Daniels questions were answered to her satisfaction today. Our contact information was provided should additional questions or concerns arise.  Based on Charlene Daniels's family history, we recommended her siblings and other maternal relatives also, have genetic counseling and testing. Charlene Daniels will let us know if we can be of any assistance in coordinating genetic counseling and/or testing for this family member.   Lastly, we encouraged Charlene Daniels. [place] to remain in contact with cancer genetics annually so that we can continuously update the family history and inform her of any  changes in cancer genetics and testing that may be of benefit for this family.   Charlene Daniels.  [place] questions were answered to her satisfaction today. Our contact information was provided should additional questions or concerns arise. Thank you for the referral and allowing [place] to share in the care of your patient.   Tana Felts, Charlene Daniels, [facility] Certified Genetic Counselor [email or web address] phone: [phone]  The patient was seen for a total of 30 minutes in face-to-face genetic counseling.

## 2018-03-10 ENCOUNTER — Ambulatory Visit: Payer: 59

## 2018-03-12 ENCOUNTER — Ambulatory Visit: Payer: 59

## 2018-03-12 DIAGNOSIS — M25511 Pain in right shoulder: Secondary | ICD-10-CM | POA: Diagnosis not present

## 2018-03-12 DIAGNOSIS — M6281 Muscle weakness (generalized): Secondary | ICD-10-CM | POA: Diagnosis not present

## 2018-03-12 DIAGNOSIS — S92351D Displaced fracture of fifth metatarsal bone, right foot, subsequent encounter for fracture with routine healing: Secondary | ICD-10-CM | POA: Diagnosis not present

## 2018-03-12 NOTE — Therapy (Signed)
[place] MAIN [facility] SERVICES [address] [place], [place], [address] Phone: [phone]   Fax:  [phone]  Physical Therapy Treatment  Patient Details  Name: Charlene Daniels MRN: [id number] Date of Birth: 07/16/1970 No data recorded  Encounter Date: 03/12/2018  PT End of Session - 03/12/18 1634    Visit Number  17    Number of Visits  21    Date for PT Re-Evaluation  03/24/18    Authorization Type  4/10 starting 02/24/18    PT Start Time  1624    PT Stop Time  1703    PT Time Calculation (min)  39 min    Activity Tolerance  Patient limited by pain;Patient tolerated treatment well    Behavior During Therapy  [facility] for tasks assessed/performed       Past Medical History:  Diagnosis Date  . Arthritis   . Chronic kidney disease    on meds to protect kidney from DM  . Diabetes mellitus without complication ([place])   . Family history of breast cancer 03/06/2018  . Family history of esophageal cancer   . Family history of ovarian cancer 03/06/2018  . GERD (gastroesophageal reflux disease)   . Headache(784.0)   . PONV (postoperative nausea and vomiting)     Past Surgical History:  Procedure Laterality Date  . ABDOMINAL HYSTERECTOMY  2009  . CHOLECYSTECTOMY  1994  . DILATION AND CURETTAGE OF UTERUS  1994  . LYMPH NODE DISSECTION Right 1999   right neck cervical LN dissection after Cat-scratch fever  . SHOULDER ARTHROSCOPY WITH OPEN ROTATOR CUFF REPAIR Right 09/19/2017   Procedure: SHOULDER ARTHROSCOPY WITH OPEN ROTATOR CUFF REPAIR & SUBACROMINAL DECOMPRESSION.;  Surgeon: Thornton Park, MD;  Location: [facility];  Service: Orthopedics;  Laterality: Right;  . SHOULDER ARTHROSCOPY WITH ROTATOR CUFF REPAIR AND SUBACROMIAL DECOMPRESSION Left 02/12/2013   Procedure: LEFT SHOULDER ARTHROSCOPY WITH SUBACROMIAL DECOMPRESSION, DISTAL CLAVICLE RESECTION, POSSIBLE BICEP TENOTOMY, POSSIBLE ROTATOR CUFF REPAIR;  Surgeon: Marin Shutter, MD;  Location:  [place];  Service: Orthopedics;  Laterality: Left;  . TONSILLECTOMY  1998    There were no vitals filed for this visit.  Subjective Assessment - 03/12/18 1629    Subjective  Patient reports she went to the doctor today and was told her foot is not healing due to having to be walking on it from her shoulder. Gets surgery in 2 weeks.     Pertinent History  atient was 8 weeks out right shoulder arthroscopy with mini open rotator cuff repair (performed 5/2)  when she moved her waist band feeling like "her arm had been ripped off". Since this moment on has not been out of pain. Went back to PT and her therapist felt like it had been retorn since she was not able to move arm. Mack Guise said MRI was false positive on tear, wanted pt to go to neurology thinking pain is from neck.  Was thinking it potentially could be complex regional pain syndrome. Patient wanted second opinion so went to [facility] in [place] to [employer]. Was not torn, anchor is lifted, where the repair is was not torn. Prior rotator cuff repair. Severe tendinosis of the supraspinatus tendon with a high-grade partial-thickness articular surface tear with a possible small full-thickness component. Severe tendinosis of the infraspinatus tendon.. Finished up steroid taper form Kransinski which helped with numb and ache.   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 Still not sleeping in bed , wants to  get out of recliner. Has not had PT for 10 days. Patient has lymphedema on R  Side.     Limitations  Reading;Lifting;Sitting;Walking;House hold activities;Other (comment)    Diagnostic tests  MRI: Prior rotator cuff repair. Severe tendinosis of the    Patient Stated Goals  to reduce pain, return to work duties, improve ROM and strength     Currently in Pain?  Yes    Pain Score  5     Pain Location  Shoulder    Pain Orientation  Right    Pain Descriptors / Indicators  Aching    Pain Type  Acute pain    Pain Onset  More than a month ago    Pain  Frequency  Constant       UBE backwards 4 minutes Lvl 2 for muscle contraction and capacity increase for improved posture    Seated: Scapular retractions via bringing arms from needs to hips to increase ROM and upright posture x 20  AAROM alphabet with UE ranger AAROM circles 10x clockwise, 10x counterclockwise with ranger cues for widening range and slowing velocity Scapular mobilizations: AP 10x, PA: 10x, upward rotation downward rotation 10x (PROM) improved mobility with repetition   Supine: wedge with roll under legs Distraction with rhythmic rotation 60 seconds x 5 trials PROM flexion 8x 10 second holds, abduction 8x 10 second holds, ER 13x 10 second holds, IR 8x 10 second holds Bicep lengthening with movement x 4 minutes PNF1 modified with PT assistance x15 PNF 2 modified with PT assistance x 15 STM anterior bicep/GH joint/ subscap with focus on pectoral musculature x 8 minutes Rotational mobilizations of shoulder with three point hold, focus on AP and inferior mobilization                        PT Education - 03/12/18 1633    Education Details  exercise technique, manual, pain control,    Person(s) Educated  Patient    Methods  Explanation;Demonstration;Verbal cues    Comprehension  Returned demonstration;Verbalized understanding       PT Short Term Goals - 02/25/18 0817      PT SHORT TERM GOAL #1   Title  Patient will be independent in home exercise program to improve strength/mobility for better functional independence with ADLs.    Baseline  HEP given 9/4 compliant 10/8: compliant    Time  2    Period  Weeks    Status  Partially Met      PT SHORT TERM GOAL #2   Title  Patient will report a worst pain of 7/10 on VAS in  R shoulder to improve tolerance with ADLs and reduced symptoms with activities.     Baseline  8/8: 9/10 9/4: 7-8/10 10/8: 7/10     Time  2    Period  Weeks    Status  Achieved        PT Long Term Goals - 02/24/18 1733       PT LONG TERM GOAL #1   Title  Patient will report a worst pain of 5/10 on VAS in    R shoulder         to improve tolerance with ADLs and reduced symptoms with activities.     Baseline  8/8: 9/10 9/4: 7-8/10 10/7: 7/10     Time  4    Period  Weeks    Status  Partially Met    Target Date  03/24/18  PT LONG TERM GOAL #2   Title  Patient will improve shoulder AROM to > 140 degrees of flexion, scaption, and abduction for improved ability to perform overhead activities.    Baseline  8/8: AROM standing: flexion 95, abduction 99 9/4: flexion 110 painful  abduction: 106 painful 10/7: R flexion 116 abduction 128    Time  4    Period  Weeks    Status  Partially Met    Target Date  03/24/18      PT LONG TERM GOAL #3   Title  Patient will decrease Quick DASH score by > 8 points (gross 78%, work 92%) demonstrating reduced self-reported upper extremity disability.    Baseline  8/8: 86% work 100% 9/5:  56.8%, work 75%   (Pended)     Time  4    Period  Weeks    Status  Achieved      PT LONG TERM GOAL #4   Title  Patient will demonstrate 4/5 strength in RUE to allow patient to return to work and perform job duties without compensations.     Baseline  8/8: strength limited by ROM and pain 9/4: abduction and flexion 2+/5 due to pain 10/7: 2+/5 abduction and flexion due to pain    Time  4    Period  Weeks    Status  On-going    Target Date  03/24/18      PT LONG TERM GOAL #5   Title  Patient will decrease Quick DASH score by > 8 points (gross 48.8%, work 67%) demonstrating reduced self-reported upper extremity disability.    Baseline  9/4: 56.8%, work 75%; 10/7: general:  61.4%  work 100%   (Pended)     Time  4    Period  Weeks    Status  Partially Met  (Pended)     Target Date  03/24/18            Plan - 03/13/18 0743    Clinical Impression Statement  Patient presents with increased tension of R UE musculature due to tension from recent medical diagnosis. Patient challenged with  relaxation of muscle however pain was reduced with mobility. Patient presented initially with shoulder hiking and guarding of R shoulder, improved by end of session. Patient will continue to benefit from skilled physical therapy for improvements in shoulder ROM, strength, and decreased pain    Rehab Potential  Fair    Clinical Impairments Affecting Rehab Potential  (+) age, educational level (-) secondary injury     PT Frequency  2x / week    PT Duration  4 weeks    PT Treatment/Interventions  ADLs/Self Care Home Management;Aquatic Therapy;Cryotherapy;Electrical Stimulation;Parrafin;Ultrasound;Traction;Moist Heat;Iontophoresis 58m/ml Dexamethasone;Functional mobility training;Therapeutic activities;Therapeutic exercise;Patient/family education;Neuromuscular re-education;Manual techniques;Manual lymph drainage;Compression bandaging;Taping;Energy conservation;Dry needling;Passive range of motion;Scar mobilization    PT Next Visit Plan  PNF, introduce active mobility     PT Home Exercise Plan  see sheet    Consulted and Agree with Plan of Care  Patient       Patient will benefit from skilled therapeutic intervention in order to improve the following deficits and impairments:  Decreased activity tolerance, Decreased coordination, Decreased endurance, Decreased mobility, Decreased range of motion, Decreased strength, Decreased scar mobility, Hypomobility, Increased edema, Impaired flexibility, Impaired perceived functional ability, Increased muscle spasms, Impaired UE functional use, Postural dysfunction, Improper body mechanics, Pain  Visit Diagnosis: Acute pain of right shoulder  Muscle weakness (generalized)     Problem List Patient Active Problem List  Diagnosis Date Noted  . Family history of breast cancer 03/06/2018  . Family history of ovarian cancer 03/06/2018  . Family history of esophageal cancer    Janna Arch, PT, DPT   03/13/2018, 7:46 AM  [place] MAIN [facility] SERVICES [address] [place], [place], [address] Phone: [phone]   Fax:  [phone]  Name: Charlene Daniels MRN: [id number] Date of Birth: 1971/05/08

## 2018-03-13 DIAGNOSIS — M67921 Unspecified disorder of synovium and tendon, right upper arm: Secondary | ICD-10-CM | POA: Diagnosis not present

## 2018-03-13 DIAGNOSIS — M25511 Pain in right shoulder: Secondary | ICD-10-CM | POA: Diagnosis not present

## 2018-03-13 DIAGNOSIS — Z01818 Encounter for other preprocedural examination: Secondary | ICD-10-CM | POA: Diagnosis not present

## 2018-03-17 ENCOUNTER — Ambulatory Visit: Payer: 59

## 2018-03-17 ENCOUNTER — Ambulatory Visit (INDEPENDENT_AMBULATORY_CARE_PROVIDER_SITE_OTHER): Payer: Self-pay | Admitting: Physician Assistant

## 2018-03-17 ENCOUNTER — Encounter: Payer: Self-pay | Admitting: Physician Assistant

## 2018-03-17 VITALS — BP 112/70 | HR 79 | Temp 97.7°F | Wt 280.0 lb

## 2018-03-17 DIAGNOSIS — M6281 Muscle weakness (generalized): Secondary | ICD-10-CM

## 2018-03-17 DIAGNOSIS — M25511 Pain in right shoulder: Secondary | ICD-10-CM

## 2018-03-17 DIAGNOSIS — H6122 Impacted cerumen, left ear: Secondary | ICD-10-CM

## 2018-03-17 DIAGNOSIS — H9202 Otalgia, left ear: Secondary | ICD-10-CM

## 2018-03-17 MED ORDER — AMOXICILLIN 875 MG PO TABS: 875.0000 mg | ORAL_TABLET | Freq: Two times a day (BID) | ORAL | 0 refills | Status: AC

## 2018-03-17 NOTE — Therapy (Signed)
[place] MAIN [facility] SERVICES [address] [place], [place], [address] Phone: [phone]   Fax:  [phone]  Physical Therapy Treatment  Patient Details  Name: Charlene Daniels MRN: [id number] Date of Birth: [birth year] No data recorded  Encounter Date: 03/17/2018  PT End of Session - 03/17/18 1710    Visit Number  18    Number of Visits  21    Date for PT Re-Evaluation  03/24/18    Authorization Type  5/10 starting 02/24/18    PT Start Time  1701    PT Stop Time  1745    PT Time Calculation (min)  44 min    Activity Tolerance  Patient limited by pain;Patient tolerated treatment well    Behavior During Therapy  [facility] for tasks assessed/performed       Past Medical History:  Diagnosis Date  . Arthritis   . Chronic kidney disease    on meds to protect kidney from DM  . Diabetes mellitus without complication ([place])   . Family history of breast cancer 03/06/2018  . Family history of esophageal cancer   . Family history of ovarian cancer 03/06/2018  . GERD (gastroesophageal reflux disease)   . Headache(784.0)   . PONV (postoperative nausea and vomiting)     Past Surgical History:  Procedure Laterality Date  . ABDOMINAL HYSTERECTOMY  2009  . CHOLECYSTECTOMY  1994  . DILATION AND CURETTAGE OF UTERUS  1994  . LYMPH NODE DISSECTION Right 1999   right neck cervical LN dissection after Cat-scratch fever  . SHOULDER ARTHROSCOPY WITH OPEN ROTATOR CUFF REPAIR Right 09/19/2017   Procedure: SHOULDER ARTHROSCOPY WITH OPEN ROTATOR CUFF REPAIR & SUBACROMINAL DECOMPRESSION.;  Surgeon: Thornton Park, MD;  Location: [facility];  Service: Orthopedics;  Laterality: Right;  . SHOULDER ARTHROSCOPY WITH ROTATOR CUFF REPAIR AND SUBACROMIAL DECOMPRESSION Left 02/12/2013   Procedure: LEFT SHOULDER ARTHROSCOPY WITH SUBACROMIAL DECOMPRESSION, DISTAL CLAVICLE RESECTION, POSSIBLE BICEP TENOTOMY, POSSIBLE ROTATOR CUFF REPAIR;  Surgeon: Marin Shutter, MD;  Location:  [place];  Service: Orthopedics;  Laterality: Left;  . TONSILLECTOMY  1998    There were no vitals filed for this visit.  Subjective Assessment - 03/17/18 1704    Subjective  Patient went to employee health today for ear infection, felt dizzy and nauseous yesterday. Is getting surgery next week and potentially will have to get bicep involved.     Pertinent History  atient was 8 weeks out right shoulder arthroscopy with mini open rotator cuff repair (performed 5/2)  when she moved her waist band feeling like "her arm had been ripped off". Since this moment on has not been out of pain. Went back to PT and her therapist felt like it had been retorn since she was not able to move arm. Mack Guise said MRI was false positive on tear, wanted pt to go to neurology thinking pain is from neck.  Was thinking it potentially could be complex regional pain syndrome. Patient wanted second opinion so went to [facility] in [place] to [employer]. Was not torn, anchor is lifted, where the repair is was not torn. Prior rotator cuff repair. Severe tendinosis of the supraspinatus tendon with a high-grade partial-thickness articular surface tear with a possible small full-thickness component. Severe tendinosis of the infraspinatus tendon.. Finished up steroid taper form Kransinski which helped with numb and ache.   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 Still not sleeping in bed , wants to get out of recliner. Has  not had PT for 10 days. Patient has lymphedema on R  Side.     Limitations  Reading;Lifting;Sitting;Walking;House hold activities;Other (comment)    Diagnostic tests  MRI: Prior rotator cuff repair. Severe tendinosis of the    Patient Stated Goals  to reduce pain, return to work duties, improve ROM and strength     Currently in Pain?  Yes    Pain Score  5     Pain Location  Shoulder    Pain Orientation  Right    Pain Descriptors / Indicators  Aching    Pain Type  Acute pain    Pain Onset  More than a month ago     Pain Frequency  Constant       UBE backwards 4 minutes Lvl 2 for muscle contraction and capacity increase for improved posture    Seated: Scapular retractions via bringing arms from needs to hips to increase ROM and upright posture x 20  AAROM alphabet with UE ranger AAROM circles 10x clockwise, 10x counterclockwise with ranger cues for widening range and slowing velocity Scapular mobilizations: AP 10x, PA: 10x, upward rotation downward rotation 10x (PROM) improved mobility with repetition   Supine: wedge with roll under legs Distraction with rhythmic rotation 60 seconds x 5 trials PROM flexion 10x 10 second holds, abduction 10x 10 second holds, ER 10x 10 second holds, IR 8x 10 second holds Bicep lengthening with movement x 4 minutes PNF1 modified with PT assistance x15 PNF 2 modified with PT assistance x 15 STM anterior bicep/GH joint/ subscap with focus on pectoral musculature x 8 minutes Rotational mobilizations of shoulder with three point hold, focus on AP and inferior mobilization Swiss ball forward flexion on table 10x, angle 10x each direction.                           PT Education - 03/17/18 1710    Education Details  exercise technique, manual, pain control     Person(s) Educated  Patient    Methods  Explanation;Demonstration;Verbal cues    Comprehension  Verbalized understanding;Returned demonstration       PT Short Term Goals - 02/25/18 0817      PT SHORT TERM GOAL #1   Title  Patient will be independent in home exercise program to improve strength/mobility for better functional independence with ADLs.    Baseline  HEP given 9/4 compliant 10/8: compliant    Time  2    Period  Weeks    Status  Partially Met      PT SHORT TERM GOAL #2   Title  Patient will report a worst pain of 7/10 on VAS in  R shoulder to improve tolerance with ADLs and reduced symptoms with activities.     Baseline  8/8: 9/10 9/4: 7-8/10 10/8: 7/10     Time  2     Period  Weeks    Status  Achieved        PT Long Term Goals - 02/24/18 1733      PT LONG TERM GOAL #1   Title  Patient will report a worst pain of 5/10 on VAS in    R shoulder         to improve tolerance with ADLs and reduced symptoms with activities.     Baseline  8/8: 9/10 9/4: 7-8/10 10/7: 7/10     Time  4    Period  Weeks    Status  Partially Met    Target Date  03/24/18      PT LONG TERM GOAL #2   Title  Patient will improve shoulder AROM to > 140 degrees of flexion, scaption, and abduction for improved ability to perform overhead activities.    Baseline  8/8: AROM standing: flexion 95, abduction 99 9/4: flexion 110 painful  abduction: 106 painful 10/7: R flexion 116 abduction 128    Time  4    Period  Weeks    Status  Partially Met    Target Date  03/24/18      PT LONG TERM GOAL #3   Title  Patient will decrease Quick DASH score by > 8 points (gross 78%, work 92%) demonstrating reduced self-reported upper extremity disability.    Baseline  8/8: 86% work 100% 9/5:  56.8%, work 75%   (Pended)     Time  4    Period  Weeks    Status  Achieved      PT LONG TERM GOAL #4   Title  Patient will demonstrate 4/5 strength in RUE to allow patient to return to work and perform job duties without compensations.     Baseline  8/8: strength limited by ROM and pain 9/4: abduction and flexion 2+/5 due to pain 10/7: 2+/5 abduction and flexion due to pain    Time  4    Period  Weeks    Status  On-going    Target Date  03/24/18      PT LONG TERM GOAL #5   Title  Patient will decrease Quick DASH score by > 8 points (gross 48.8%, work 67%) demonstrating reduced self-reported upper extremity disability.    Baseline  9/4: 56.8%, work 75%; 10/7: general:  61.4%  work 100%   (Pended)     Time  4    Period  Weeks    Status  Partially Met  (Pended)     Target Date  03/24/18            Plan - 03/17/18 1741    Clinical Impression Statement  Patient demonstrating improved AAROM motion  with swiss ball and UE ranger. Is prepared for surgery next week, but slightly nervous. Scapula hypomobility improved with repetition. Patient will continue to benefit from skilled physical therapy for improvements in shoulder ROM, strength, and decreased pain    Rehab Potential  Fair    Clinical Impairments Affecting Rehab Potential  (+) age, educational level (-) secondary injury     PT Frequency  2x / week    PT Duration  4 weeks    PT Treatment/Interventions  ADLs/Self Care Home Management;Aquatic Therapy;Cryotherapy;Electrical Stimulation;Parrafin;Ultrasound;Traction;Moist Heat;Iontophoresis 66m/ml Dexamethasone;Functional mobility training;Therapeutic activities;Therapeutic exercise;Patient/family education;Neuromuscular re-education;Manual techniques;Manual lymph drainage;Compression bandaging;Taping;Energy conservation;Dry needling;Passive range of motion;Scar mobilization    PT Next Visit Plan  PNF, introduce active mobility     PT Home Exercise Plan  see sheet    Consulted and Agree with Plan of Care  Patient       Patient will benefit from skilled therapeutic intervention in order to improve the following deficits and impairments:  Decreased activity tolerance, Decreased coordination, Decreased endurance, Decreased mobility, Decreased range of motion, Decreased strength, Decreased scar mobility, Hypomobility, Increased edema, Impaired flexibility, Impaired perceived functional ability, Increased muscle spasms, Impaired UE functional use, Postural dysfunction, Improper body mechanics, Pain  Visit Diagnosis: Acute pain of right shoulder  Muscle weakness (generalized)     Problem List Patient Active Problem List   Diagnosis Date Noted  .  Family history of breast cancer 03/06/2018  . Family history of ovarian cancer 03/06/2018  . Family history of esophageal cancer     Janna Arch, PT, DPT   03/17/2018, 5:46 PM  [place] MAIN [facility]  SERVICES [address] [place], [place], [address] Phone: [phone]   Fax:  [phone]  Name: HEYLEE TANT MRN: [id number] Date of Birth: Dec 03, 1970

## 2018-03-17 NOTE — Progress Notes (Signed)
Patient ID: [name] DOB: 27-Nov-1970 AGE: 47 y.o. MRN: [id number]   PCP: Rusty Aus, MD   Chief Complaint:  Chief Complaint  Patient presents with  . choice/left ear pain     Subjective:    HPI:  Charlene Daniels is a 47 y.o. female presents for evaluation  Chief Complaint  Patient presents with  . choice/left ear pain    47 year old female presents to [facility] with two day history of left ear pain. Began yesterday afternoon/evening with associated nausea. Ear pain only present with leaning forward. Describes as sharp pain. Still present this morning/today. Nausea resolved. Associated mild dizziness. Patient does admit to several days of sneezing. Has not taken any OTC medication for symptom relief. Has not attempted any home remedy. Denies fever, chills, headache, ear discharge/drainage, tinnitus, sinus pain, nasal congestion, sore throat, cough, chest pain, abdominal pain, nausea/vomiting.  Patient seen at [facility] on 01/03/2018 for similar left ear pain. Due to cerumen impaction. Unsuccessful ear irrigation. Patient states she used over the counter Debrox ear drops. Husband flushed ear with OTC ear irrigation kit. Symptoms resolved.  Patient scheduled next week for right shoulder rotator cuff surgical repair by [facility]. Patient concerned about an infection delaying her surgery.   A complete, at least 10 system review of symptoms was performed, pertinent positives and negatives as mentioned in HPI, otherwise negative.  The following portions of the patient's history were reviewed and updated as appropriate: allergies, current medications and past medical history.  Patient Active Problem List   Diagnosis Date Noted  . Family history of breast cancer 03/06/2018  . Family history of ovarian cancer 03/06/2018  . Family history of esophageal cancer     Allergies  Allergen Reactions  . Darvocet [Propoxyphene N-Acetaminophen] Nausea Only  and Other (See Comments)    headache  . Ultram [Tramadol] Other (See Comments)    hallucinations  . Sulfa Antibiotics Rash    Rash on wrist    Current Outpatient Medications on File Prior to Visit  Medication Sig Dispense Refill  . acetaminophen (TYLENOL) 500 MG tablet Take 1,000 mg by mouth 2 (two) times daily as needed (for pain.).    Marland Kitchen Ascorbic Acid (VITAMIN C PO) Take 1 tablet by mouth at bedtime.    Marland Kitchen aspirin EC 81 MG tablet Take 81 mg by mouth at bedtime.    . cholecalciferol (VITAMIN D) 1000 units tablet Take 1,000 Units by mouth at bedtime.    . Dulaglutide (TRULICITY) 3.24 MW/1.0UV SOPN Inject 0.75 mg into the skin once a week.    . estradiol (ESTRACE) 2 MG tablet Take 1 mg by mouth at bedtime.  99  . Garlic 2536 MG CAPS Take 1,000 mg by mouth at bedtime.    Marland Kitchen glimepiride (AMARYL) 2 MG tablet Take 2 mg by mouth daily at 12 noon.   98  . ibuprofen (ADVIL,MOTRIN) 200 MG tablet Take 800 mg by mouth 2 (two) times daily as needed (for pain.).    Marland Kitchen KRILL OIL PO Take 1 capsule by mouth at bedtime.    Marland Kitchen lisinopril-hydrochlorothiazide (PRINZIDE,ZESTORETIC) 20-12.5 MG per tablet Take 0.5 tablets by mouth at bedtime.     . metFORMIN (GLUCOPHAGE) 1000 MG tablet Take 1,000 mg by mouth 2 (two) times daily with a meal.    . OVER THE COUNTER MEDICATION Take 5 drops by mouth 2 (two) times daily. CBD OIL    . pantoprazole (PROTONIX) 40 MG tablet Take 40 mg by  mouth at bedtime.     . Probiotic Product (PROBIOTIC PO) Take 1 capsule by mouth at bedtime.    Marland Kitchen spironolactone (ALDACTONE) 50 MG tablet Take 50 mg by mouth at bedtime.     . vitamin B-12 (CYANOCOBALAMIN) 1000 MCG tablet Take 1,000 mcg by mouth at bedtime.    Marland Kitchen oxyCODONE (OXY IR/ROXICODONE) 5 MG immediate release tablet   0   No current facility-administered medications on file prior to visit.        Objective:    Vitals:   03/17/18 1142  BP: 112/70  Pulse: 79  Temp: 97.7 F (36.5 C)  SpO2: 98%     Wt Readings from Last 3  Encounters:  03/17/18 280 lb (127 kg)  02/17/18 279 lb (126.6 kg)  01/03/18 281 lb (127.5 kg)    Physical Exam:   General Appearance:  Alert, cooperative, no distress, appears stated age. Afebrile.  Head:  Normocephalic, without obvious abnormality, atraumatic  Eyes:  PERRL, conjunctiva/corneas clear, EOM's intact, fundi benign, both eyes  Ears:  Right ear canal WNL. Right TM pearly, pale pink, with light reflex. Left ear canal with cerumen impaction. TM unable to be visualized.  Nose: Nares normal, septum midline,mucosa normal, no drainage or sinus tenderness  Throat: Lips, mucosa, and tongue normal; teeth and gums normal. Throat with no erythema. No postnasal drip.  Neck: Supple, symmetrical, trachea midline, no adenopathy;  thyroid: not enlarged, symmetric, no tenderness/mass/nodules; no carotid bruit or JVD  Back:   Symmetric, no curvature, ROM normal, no CVA tenderness  Lungs:   Clear to auscultation bilaterally, respirations unlabored  Heart:  Regular rate and rhythm, S1 and S2 normal, no murmur, rub, or gallop  Abdomen:   Soft, non-tender, bowel sounds active all four quadrants,  no masses, no organomegaly  Extremities: Extremities normal, atraumatic, no cyanosis or edema  Pulses: 2+ and symmetric  Skin: Skin color, texture, turgor normal, no rashes or lesions  Lymph nodes: Cervical, supraclavicular, and axillary nodes normal  Neurologic: Normal    Assessment & Plan:    Exam findings, diagnosis etiology and medication use and indications reviewed with patient. Follow-Up and discharge instructions provided. No emergent/urgent issues found on exam.  Patient education was provided.   Patient verbalized understanding of information provided and agrees with plan of care (POC), all questions answered. The patient is advised to call or return to clinic if condition does not see an improvement in symptoms, or to seek the care of the closest emergency department if condition worsens  with the above plan.    1. Excessive ear wax, left  Patient underwent left ear irrigation. Successful removal of cerumen. Ear canal revealed no cerumen. No edema or erythema. No purulent drainage. TM with mild injection and faint erythema.   2. Acute otalgia, left  - amoxicillin (AMOXIL) 875 MG tablet; Take 1 tablet (875 mg total) by mouth 2 (two) times daily for 7 days.  Dispense: 14 tablet; Refill: 0  Patient with two day history of left ear pain. Suspect solely due to cerumen impaction. However, TM with mild injection/faint erythema. Will prescribe Amoxicillin to ensure no infection prior to surgery scheduled for next week.   Montey Hora, MHS, PA-C Advanced Practice Provider [facility]  [address], [facility], [place], [place] [address] (p):  [phone] [email or web address]

## 2018-03-17 NOTE — Patient Instructions (Signed)
Thank you for choosing [facility] for your health care needs.  You have been diagnosed with left ear earwax buildup (aka cerumen impaction). You underwent left ear irrigation to clean out the earwax.  Your ear drum (tympanic membrane) is slightly red. You have been prescribed an antibiotic, Amoxicillin, in case of infection.  Hope you feel better soon. Good luck with your surgery next week.  Earwax Buildup, Adult  The ears produce a substance called earwax that helps keep bacteria out of the ear and protects the skin in the ear canal. Occasionally, earwax can build up in the ear and cause discomfort or hearing loss. What increases the risk? This condition is more likely to develop in people who:  Are female.  Are elderly.  Naturally produce more earwax.  Clean their ears often with cotton swabs.  Use earplugs often.  Use in-ear headphones often.  Wear hearing aids.  Have narrow ear canals.  Have earwax that is overly thick or sticky.  Have eczema.  Are dehydrated.  Have excess hair in the ear canal.  What are the signs or symptoms? Symptoms of this condition include:  Reduced or muffled hearing.  A feeling of fullness in the ear or feeling that the ear is plugged.  Fluid coming from the ear.  Ear pain.  Ear itch.  Ringing in the ear.  Coughing.  An obvious piece of earwax that can be seen inside the ear canal.  How is this diagnosed? This condition may be diagnosed based on:  Your symptoms.  Your medical history.  An ear exam. During the exam, your health care provider will look into your ear with an instrument called an otoscope.  You may have tests, including a hearing test. How is this treated? This condition may be treated by:  Using ear drops to soften the earwax.  Having the earwax removed by a health care provider. The health care provider may: ? Flush the ear with water. ? Use an instrument that has a loop on the end (curette). ? Use a  suction device.  Surgery to remove the wax buildup. This may be done in severe cases.  Follow these instructions at home:  Take over-the-counter and prescription medicines only as told by your health care provider.  Do not put any objects, including cotton swabs, into your ear. You can clean the opening of your ear canal with a washcloth or facial tissue.  Follow instructions from your health care provider about cleaning your ears. Do not over-clean your ears.  Drink enough fluid to keep your urine clear or pale yellow. This will help to thin the earwax.  Keep all follow-up visits as told by your health care provider. If earwax builds up in your ears often or if you use hearing aids, consider seeing your health care provider for routine, preventive ear cleanings. Ask your health care provider how often you should schedule your cleanings.  If you have hearing aids, clean them according to instructions from the manufacturer and your health care provider. Contact a health care provider if:  You have ear pain.  You develop a fever.  You have blood, pus, or other fluid coming from your ear.  You have hearing loss.  You have ringing in your ears that does not go away.  Your symptoms do not improve with treatment.  You feel like the room is spinning (vertigo). Summary  Earwax can build up in the ear and cause discomfort or hearing loss.  The most common  symptoms of this condition include reduced or muffled hearing and a feeling of fullness in the ear or feeling that the ear is plugged.  This condition may be diagnosed based on your symptoms, your medical history, and an ear exam.  This condition may be treated by using ear drops to soften the earwax or by having the earwax removed by a health care provider.  Do not put any objects, including cotton swabs, into your ear. You can clean the opening of your ear canal with a washcloth or facial tissue. This information is not intended  to replace advice given to you by your health care provider. Make sure you discuss any questions you have with your health care provider. Document Released: 06/14/2004 Document Revised: 07/18/2016 Document Reviewed: 07/18/2016 Elsevier Interactive Patient Education  [employer].

## 2018-03-19 ENCOUNTER — Ambulatory Visit: Payer: 59

## 2018-03-19 ENCOUNTER — Telehealth: Payer: Self-pay | Admitting: Emergency Medicine

## 2018-03-19 DIAGNOSIS — M25511 Pain in right shoulder: Secondary | ICD-10-CM | POA: Diagnosis not present

## 2018-03-19 DIAGNOSIS — M6281 Muscle weakness (generalized): Secondary | ICD-10-CM

## 2018-03-19 NOTE — Therapy (Signed)
[place] MAIN [facility] SERVICES [address] [place], [place], [address] Phone: [phone]   Fax:  [phone]  Physical Therapy Treatment  Patient Details  Name: Charlene Daniels MRN: [id number] Date of Birth: December 02, 1970 No data recorded  Encounter Date: 03/19/2018  PT End of Session - 03/19/18 1705    Visit Number  19    Number of Visits  21    Date for PT Re-Evaluation  03/24/18    Authorization Type  6/10 starting 02/24/18    PT Start Time  1700    PT Stop Time  1744    PT Time Calculation (min)  44 min    Activity Tolerance  Patient limited by pain;Patient tolerated treatment well    Behavior During Therapy  [facility] for tasks assessed/performed       Past Medical History:  Diagnosis Date  . Arthritis   . Chronic kidney disease    on meds to protect kidney from DM  . Diabetes mellitus without complication ([place])   . Family history of breast cancer 03/06/2018  . Family history of esophageal cancer   . Family history of ovarian cancer 03/06/2018  . GERD (gastroesophageal reflux disease)   . Headache(784.0)   . PONV (postoperative nausea and vomiting)     Past Surgical History:  Procedure Laterality Date  . ABDOMINAL HYSTERECTOMY  2009  . CHOLECYSTECTOMY  1994  . DILATION AND CURETTAGE OF UTERUS  1994  . LYMPH NODE DISSECTION Right 1999   right neck cervical LN dissection after Cat-scratch fever  . SHOULDER ARTHROSCOPY WITH OPEN ROTATOR CUFF REPAIR Right 09/19/2017   Procedure: SHOULDER ARTHROSCOPY WITH OPEN ROTATOR CUFF REPAIR & SUBACROMINAL DECOMPRESSION.;  Surgeon: Thornton Park, MD;  Location: [facility];  Service: Orthopedics;  Laterality: Right;  . SHOULDER ARTHROSCOPY WITH ROTATOR CUFF REPAIR AND SUBACROMIAL DECOMPRESSION Left 02/12/2013   Procedure: LEFT SHOULDER ARTHROSCOPY WITH SUBACROMIAL DECOMPRESSION, DISTAL CLAVICLE RESECTION, POSSIBLE BICEP TENOTOMY, POSSIBLE ROTATOR CUFF REPAIR;  Surgeon: Marin Shutter, MD;  Location:  [place];  Service: Orthopedics;  Laterality: Left;  . TONSILLECTOMY  1998    There were no vitals filed for this visit.  Subjective Assessment - 03/19/18 1703    Subjective  Patient presents to physical therapy with fatigue/tiredness and increased achiness in shoulder and foot due to weather. May potentially get home health after surgery.     Pertinent History  atient was 8 weeks out right shoulder arthroscopy with mini open rotator cuff repair (performed 5/2)  when she moved her waist band feeling like "her arm had been ripped off". Since this moment on has not been out of pain. Went back to PT and her therapist felt like it had been retorn since she was not able to move arm. Mack Guise said MRI was false positive on tear, wanted pt to go to neurology thinking pain is from neck.  Was thinking it potentially could be complex regional pain syndrome. Patient wanted second opinion so went to [facility] in [place] to [employer]. Was not torn, anchor is lifted, where the repair is was not torn. Prior rotator cuff repair. Severe tendinosis of the supraspinatus tendon with a high-grade partial-thickness articular surface tear with a possible small full-thickness component. Severe tendinosis of the infraspinatus tendon.. Finished up steroid taper form [place]inski which helped with numb and ache.   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 Still not sleeping in bed , wants to get out of recliner. Has not had PT  for 10 days. Patient has lymphedema on R  Side.     Limitations  Reading;Lifting;Sitting;Walking;House hold activities;Other (comment)    Diagnostic tests  MRI: Prior rotator cuff repair. Severe tendinosis of the    Patient Stated Goals  to reduce pain, return to work duties, improve ROM and strength     Currently in Pain?  Yes    Pain Score  5     Pain Location  Shoulder    Pain Orientation  Right    Pain Descriptors / Indicators  Aching    Pain Type  Acute pain    Pain Onset  More than a month ago     Pain Frequency  Constant      UBE backwards 4 minutes Lvl 2 for muscle contraction and capacity increase for improved posture  Seated: Scapular retractions via bringing arms from needs to hips to increase ROM and upright posture x 20 AAROM alphabetwith UE ranger AAROM circles 10x clockwise, 10x counterclockwise with rangercues for widening range and slowing velocity Scapular mobilizations: AP 10x, PA: 10x, upward rotation downward rotation 10x (PROM) improved mobility with repetition  Supine:wedge with roll under legs Distraction with rhythmic rotation 60 seconds x 5 trials PROM flexion 10x 10 second holds, abduction 10x 10 second holds, ER10x 10 second holds, IR 8x 10 second holds Bicep lengtheningwith movement x6mnutes [id number] PT assistance x15 PNF 93m65moiedwith PT assistance x 15 STM anterior bicep/GH joint/ subscapwith focus on pectoral musculature x 8mi51mes Rotational mobilizations of shoulder with three point hold, focus on AP and inferior mobilization  Seated knee to back slides for IR.                            PT Education - 03/19/18 1704    Education Details  exercise technique, manual, pain control     Person(s) Educated  Patient    Methods  Explanation;Demonstration;Verbal cues    Comprehension  Verbalized understanding;Returned demonstration       PT Short Term Goals - 02/25/18 0817      PT SHORT TERM GOAL #1   Title  Patient will be independent in home exercise program to improve strength/mobility for better functional independence with ADLs.    Baseline  HEP given 9/4 compliant 10/8: compliant    Time  2    Period  Weeks    Status  Partially Met      PT SHORT TERM GOAL #2   Title  Patient will report a worst pain of 7/10 on VAS in  R shoulder to improve tolerance with ADLs and reduced symptoms with activities.     Baseline  8/8: 9/10 9/4: 7-8/10 10/8: 7/10     Time  2    Period  Weeks    Status  Achieved         PT Long Term Goals - 02/24/18 1733      PT LONG TERM GOAL #1   Title  Patient will report a worst pain of 5/10 on VAS in    R shoulder         to improve tolerance with ADLs and reduced symptoms with activities.     Baseline  8/8: 9/10 9/4: 7-8/10 10/7: 7/10     Time  4    Period  Weeks    Status  Partially Met    Target Date  03/24/18      PT LONG TERM GOAL #2   Title  Patient will improve shoulder AROM to > 140 degrees of flexion, scaption, and abduction for improved ability to perform overhead activities.    Baseline  8/8: AROM standing: flexion 95, abduction 99 9/4: flexion 110 painful  abduction: 106 painful 10/7: R flexion 116 abduction 128    Time  4    Period  Weeks    Status  Partially Met    Target Date  03/24/18      PT LONG TERM GOAL #3   Title  Patient will decrease Quick DASH score by > 8 points (gross 78%, work 92%) demonstrating reduced self-reported upper extremity disability.    Baseline  8/8: 86% work 100% 9/5:  56.8%, work 75%   (Pended)     Time  4    Period  Weeks    Status  Achieved      PT LONG TERM GOAL #4   Title  Patient will demonstrate 4/5 strength in RUE to allow patient to return to work and perform job duties without compensations.     Baseline  8/8: strength limited by ROM and pain 9/4: abduction and flexion 2+/5 due to pain 10/7: 2+/5 abduction and flexion due to pain    Time  4    Period  Weeks    Status  On-going    Target Date  03/24/18      PT LONG TERM GOAL #5   Title  Patient will decrease Quick DASH score by > 8 points (gross 48.8%, work 67%) demonstrating reduced self-reported upper extremity disability.    Baseline  9/4: 56.8%, work 75%; 10/7: general:  61.4%  work 100%   (Pended)     Time  4    Period  Weeks    Status  Partially Met  (Pended)     Target Date  03/24/18            Plan - 03/19/18 1752    Clinical Impression Statement  Patient presents with fatigue and achiness throughout body due to weather.  Patient challenged with prolonged muscle activation due to pain and poor control of recruitment. Patient will continue to benefit from skilled physical therapy for improvements in shoulder ROM, strength, and decreased pain    Rehab Potential  Fair    Clinical Impairments Affecting Rehab Potential  (+) age, educational level (-) secondary injury     PT Frequency  2x / week    PT Duration  4 weeks    PT Treatment/Interventions  ADLs/Self Care Home Management;Aquatic Therapy;Cryotherapy;Electrical Stimulation;Parrafin;Ultrasound;Traction;Moist Heat;Iontophoresis 28m/ml Dexamethasone;Functional mobility training;Therapeutic activities;Therapeutic exercise;Patient/family education;Neuromuscular re-education;Manual techniques;Manual lymph drainage;Compression bandaging;Taping;Energy conservation;Dry needling;Passive range of motion;Scar mobilization    PT Next Visit Plan  PNF, introduce active mobility     PT Home Exercise Plan  see sheet    Consulted and Agree with Plan of Care  Patient       Patient will benefit from skilled therapeutic intervention in order to improve the following deficits and impairments:  Decreased activity tolerance, Decreased coordination, Decreased endurance, Decreased mobility, Decreased range of motion, Decreased strength, Decreased scar mobility, Hypomobility, Increased edema, Impaired flexibility, Impaired perceived functional ability, Increased muscle spasms, Impaired UE functional use, Postural dysfunction, Improper body mechanics, Pain  Visit Diagnosis: Acute pain of right shoulder  Muscle weakness (generalized)     Problem List Patient Active Problem List   Diagnosis Date Noted  . Family history of breast cancer 03/06/2018  . Family history of ovarian cancer 03/06/2018  . Family history of esophageal cancer  Janna Arch, PT, DPT   03/19/2018, 5:54 PM  [place] MAIN [facility] SERVICES [address] [place],  [place], [address] Phone: [phone]   Fax:  [phone]  Name: Charlene Daniels MRN: [id number] Date of Birth: February 20, 1971

## 2018-03-19 NOTE — Telephone Encounter (Signed)
Spoke to patient whom stated that she is doing better. This was a follow up call with visit to [facility].

## 2018-03-20 ENCOUNTER — Telehealth: Payer: Self-pay | Admitting: Genetics

## 2018-03-20 NOTE — Telephone Encounter (Signed)
Revealed negative genetic testing.  Revealed that a VUS's in RNF43 and SUFU were identified.   This normal result is reassuring and indicates that it is unlikely Charlene Daniels has a hereditary predisposition to cancer syndrome.   However, genetic testing is not perfect, and cannot definitively rule out a hereditary cause.  It will be important for her to keep in contact with genetics to learn if any additional testing may be needed in the future.      She should still continue screening as recommended by her healthcare providers and we recommended her siblings and maternal relatives also have genetic testing because there could still be a mutation in the family that Charlene Daniels simply did not inherit and therefore we would not find in her testing.

## 2018-03-21 ENCOUNTER — Ambulatory Visit: Payer: Self-pay | Admitting: Genetics

## 2018-03-21 ENCOUNTER — Encounter: Payer: Self-pay | Admitting: Genetics

## 2018-03-21 DIAGNOSIS — Z800 Family history of malignant neoplasm of digestive organs: Secondary | ICD-10-CM

## 2018-03-21 DIAGNOSIS — Z803 Family history of malignant neoplasm of breast: Secondary | ICD-10-CM

## 2018-03-21 DIAGNOSIS — Z8041 Family history of malignant neoplasm of ovary: Secondary | ICD-10-CM

## 2018-03-21 DIAGNOSIS — Z1379 Encounter for other screening for genetic and chromosomal anomalies: Secondary | ICD-10-CM

## 2018-03-21 NOTE — Progress Notes (Signed)
HPI:  Charlene Daniels was previously seen in the [place] [facility] on 03/06/2018 due to a family history of breast/ovarian cancer and concerns regarding a hereditary predisposition to cancer. Please refer to our prior cancer genetics clinic note for more information regarding Charlene Daniels's medical, social and family histories, and our assessment and recommendations, at the time. Charlene Daniels recent genetic test results were disclosed to her, as well as recommendations warranted by these results. These results and recommendations are discussed in more detail below.  CANCER HISTORY:   No history exists.     FAMILY HISTORY:  We obtained a detailed, 4-generation family history.  Significant diagnoses are listed below: Family History  Problem Relation Age of Onset  . Ovarian cancer Mother 20  . Esophageal cancer Father 31  . Breast cancer Maternal Aunt        dx late 47's  . Lung cancer Other        hx smoking  . Esophageal cancer Other        hx smoking  . Liver cancer Paternal Grandfather     Charlene Daniels has no children.  She has a brother who is 41 with no hx of cancer.  He has a son and a daughter both 24 (twins). Charlene Daniels also has a sister who is 49 with no hx of cancer, she as a daughter.   Charlene Daniels father: dx with esophageal cancer at 64. Paternal Aunts/Uncles: 1 paternal uncle is 62 with no hx of cancer.  Paternal cousins: no known hx of cancer Paternal grandfather: died at 73 with a hepatoma/liver cancer.  No hx of EtoH use.  Paternal grandmother:died at 48, stroke  Charlene Daniels's mother: dx with ovarian cancer at 35, died at 37. She had BRCA1/2 only testing in 1998.  (no panel and no BART).  Maternal Aunts/Uncles: 2 maternal aunts- 1 dx with breast cancer in her late 73's and died a few years after.  The other maternal aunt lived to be 31 with no hx of cancer.   Maternal cousins: no hx of cancer Maternal grandfather: died in his 58's of a heart attack.  Maternal  grandmother:died at 79 due to complications of Lupus.  She had 2 nieces/nephews who had lung and esophageal cancer (hx of smoking)  Patient's maternal ancestors are of Caucasian/[place] descent, and paternal ancestors are of [place] descent. There is no reported Ashkenazi Jewish ancestry. There is no known consanguinity.  GENETIC TEST RESULTS: Genetic testing performed through [facility]'s Multi-Cancer Panel reported out on 03/14/2018 showed no pathogenic mutations. The Multi-Cancer Panel offered by [facility] includes sequencing and/or deletion duplication testing of the following 91 genes: AIP, ALK, APC, ATM, AXIN2, BAP1, BARD1, BLM, BMPR1A, BRCA1, BRCA2, BRIP1, BUB1B, CASR, CDC73, CDH1, CDK4, CDKN1B, CDKN1C, CDKN2A, CEBPA, CEP57, CHEK2, CTNNA1, DICER1, DIS3L2, EGFR, ENG, EPCAM, FH, FLCN, GALNT12, GATA2, GPC3, GREM1, HOXB13, HRAS, KIT, MAX, MEN1, MET, MITF, MLH1, MLH3, MSH2, MSH3, MSH6, MUTYH, NBN, NF1, NF2, NTHL1, PALB2, PDGFRA, PHOX2B, PMS2, POLD1, POLE, POT1, PRKAR1A, PTCH1, PTEN, RAD50, RAD51C, RAD51D, RB1, RECQL4, RET, RNF43, RPS20, RUNX1, SDHA, SDHAF2, SDHB, SDHC, SDHD, SMAD4, SMARCA4, SMARCB1, SMARCE1, STK11, SUFU, TERC, TERT, TMEM127, TP53, TSC1, TSC2, VHL, WRN, WT1  A variant of uncertain significance (VUS) in a gene called SUFU was also noted. c.1022+6T>C (Intronic). A variant of uncertain significance (VUS) in a gene called RNF43 was also noted. c.[id number]>T (p.[id number])  The test report will be scanned into EPIC and will be located under the Molecular Pathology section of the  Results Review tab. A portion of the result report is included below for reference.     We discussed with Charlene Daniels that because current genetic testing is not perfect, it is possible there may be a gene mutation in one of these genes that current testing cannot detect, but that chance is small.  We also discussed, that there could be another gene that has not yet been discovered, or that we have not yet tested,  that is responsible for the cancer diagnoses in the family. It is also possible there is a hereditary cause for the cancer in the family that Charlene Daniels did not inherit and therefore was not identified in her testing.  Therefore, it is important to remain in touch with cancer genetics in the future so that we can continue to offer Charlene Daniels the most up to date genetic testing.   Regarding the VUS's in RNF43 and SUFU: At this time, it is unknown if these variants are associated with increased cancer risk or if they are normal findings, but most variants such as these get reclassified to being inconsequential. They should not be used to make medical management decisions. With time, we suspect the lab will determine the significance of these variants, if any. If we do learn more about them, we will try to contact Charlene Daniels to discuss it further. However, it is important to stay in touch with [place] periodically and keep the address and phone number up to date.  ADDITIONAL GENETIC TESTING: We discussed with Charlene Daniels that her genetic testing was fairly extensive.  If there are are genes identified to increase cancer risk that can be analyzed in the future, we would be happy to discuss and coordinate this testing at that time.    CANCER SCREENING RECOMMENDATIONS: Charlene Daniels test result is considered negative (normal).  This means that we have not identified a hereditary predisposition to cancer in her at this time.   While reassuring, this does not definitively rule out a hereditary predisposition to cancer. It is still possible that there could be genetic mutations that are undetectable by current technology, or genetic mutations in genes that have not been tested or identified to increase cancer risk.  Therefore, it is recommended she continue to follow the cancer management and screening guidelines provided by her healthcare providers. An individual's cancer risk is not determined by genetic test results  alone.  Overall cancer risk assessment includes additional factors such as personal medical history, family history, etc.  These should be used to make a personalized plan for cancer prevention and surveillance.    Based on the patient's personal and family history, the statistical model (Tyrer Cusik)   Was used to estimate her risk of developing breast cancer. This estimates her lifetime risk of developing breast cancer to be approximately 11.7%. This estimation is performed in the setting negative genetic test results.  A positive result may significantly impact this risk assessment.  The patient's lifetime breast cancer risk is a preliminary estimate based on available information using one of several models endorsed by the [place] (ACS). The ACS recommends consideration of breast MRI screening as an adjunct to mammography for patients at high risk (defined as 20% or greater lifetime risk). A more detailed breast cancer risk assessment can be considered, if clinically indicated.    RECOMMENDATIONS FOR FAMILY MEMBERS:  Relatives in this family might be at some increased risk of developing cancer, over the general population risk, simply due to  the family history of cancer.  We recommended women in this family have a yearly mammogram beginning at age 80, or 73 years younger than the earliest onset of cancer, an annual clinical breast exam, and perform monthly breast self-exams. Women in this family should also have a gynecological exam as recommended by their primary provider. All family members should have a colonoscopy as directed by their doctors.  All family members should inform their physicians about the family history of cancer so their doctors can make the most appropriate screening recommendations for them.   It is also possible there is a hereditary cause for the cancer in Charlene Daniels's family that she did not inherit and therefore was not identified in her.   Therefore, we  recommended her siblings/maternal relatives also have genetic counseling and testing. Charlene Daniels will let us know if we can be of any assistance in coordinating genetic counseling and/or testing for these family members.   FOLLOW-UP: Lastly, we discussed with Charlene Daniels that cancer genetics is a rapidly advancing field and it is possible that new genetic tests will be appropriate for her and/or her family members in the future. We encouraged her to remain in contact with cancer genetics on an annual basis so we can update her personal and family histories and let her know of advances in cancer genetics that may benefit this family.   Our contact number was provided. Charlene Daniels questions were answered to her satisfaction, and she knows she is welcome to call us at anytime with additional questions or concerns.   Ferol Luz, MS, [facility] Certified Genetic Counselor [email or web address]

## 2018-03-24 ENCOUNTER — Ambulatory Visit: Payer: 59 | Attending: Orthopedic Surgery

## 2018-03-24 DIAGNOSIS — M25511 Pain in right shoulder: Secondary | ICD-10-CM | POA: Insufficient documentation

## 2018-03-24 DIAGNOSIS — M6281 Muscle weakness (generalized): Secondary | ICD-10-CM | POA: Insufficient documentation

## 2018-03-24 NOTE — Therapy (Signed)
[place] MAIN [facility] SERVICES [address] [place], [place], [address] Phone: [phone]   Fax:  [phone]  Physical Therapy Treatment  Patient Details  Name: JULLIA MULLIGAN MRN: [id number] Date of Birth: 06-23-1970 No data recorded  Encounter Date: 03/24/2018  PT End of Session - 03/25/18 0802    Visit Number  20    Number of Visits  21    Date for PT Re-Evaluation  03/24/18    Authorization Type  7/10 starting 02/24/18    PT Start Time  1717    PT Stop Time  1758    PT Time Calculation (min)  41 min    Activity Tolerance  Patient limited by pain;Patient tolerated treatment well    Behavior During Therapy  [facility] for tasks assessed/performed       Past Medical History:  Diagnosis Date  . Arthritis   . Chronic kidney disease    on meds to protect kidney from DM  . Diabetes mellitus without complication ([place])   . Family history of breast cancer 03/06/2018  . Family history of esophageal cancer   . Family history of ovarian cancer 03/06/2018  . GERD (gastroesophageal reflux disease)   . Headache(784.0)   . PONV (postoperative nausea and vomiting)     Past Surgical History:  Procedure Laterality Date  . ABDOMINAL HYSTERECTOMY  2009  . CHOLECYSTECTOMY  1994  . DILATION AND CURETTAGE OF UTERUS  1994  . LYMPH NODE DISSECTION Right 1999   right neck cervical LN dissection after Cat-scratch fever  . SHOULDER ARTHROSCOPY WITH OPEN ROTATOR CUFF REPAIR Right 09/19/2017   Procedure: SHOULDER ARTHROSCOPY WITH OPEN ROTATOR CUFF REPAIR & SUBACROMINAL DECOMPRESSION.;  Surgeon: Thornton Park, MD;  Location: [facility] ORS;  Service: Orthopedics;  Laterality: Right;  . SHOULDER ARTHROSCOPY WITH ROTATOR CUFF REPAIR AND SUBACROMIAL DECOMPRESSION Left 02/12/2013   Procedure: LEFT SHOULDER ARTHROSCOPY WITH SUBACROMIAL DECOMPRESSION, DISTAL CLAVICLE RESECTION, POSSIBLE BICEP TENOTOMY, POSSIBLE ROTATOR CUFF REPAIR;  Surgeon: Marin Shutter, MD;  Location:  [facility];  Service: Orthopedics;  Laterality: Left;  . TONSILLECTOMY  1998    There were no vitals filed for this visit.  Subjective Assessment - 03/24/18 1726    Subjective  Patient is getting surgery 11/ 7 with potential bicep involvement. Has been feeling more stiff in shoulder due to recent cold weather. Pain has been improving.     Pertinent History  atient was 8 weeks out right shoulder arthroscopy with mini open rotator cuff repair (performed 5/2)  when she moved her waist band feeling like "her arm had been ripped off". Since this moment on has not been out of pain. Went back to PT and her therapist felt like it had been retorn since she was not able to move arm. Mack Guise said MRI was false positive on tear, wanted pt to go to neurology thinking pain is from neck.  Was thinking it potentially could be complex regional pain syndrome. Patient wanted second opinion so went to [facility] in [place] to [employer]. Was not torn, anchor is lifted, where the repair is was not torn. Prior rotator cuff repair. Severe tendinosis of the supraspinatus tendon with a high-grade partial-thickness articular surface tear with a possible small full-thickness component. Severe tendinosis of the infraspinatus tendon.. Finished up steroid taper form [place] which helped with numb and ache.   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 Still not sleeping in bed , wants to get out of recliner. Has not  had PT for 10 days. Patient has lymphedema on R  Side.     Limitations  Reading;Lifting;Sitting;Walking;House hold activities;Other (comment)    Diagnostic tests  MRI: Prior rotator cuff repair. Severe tendinosis of the    Patient Stated Goals  to reduce pain, return to work duties, improve ROM and strength     Currently in Pain?  Yes    Pain Score  4     Pain Location  Shoulder    Pain Orientation  Right    Pain Descriptors / Indicators  Aching    Pain Type  Acute pain    Pain Onset  More than a month ago    Pain  Frequency  Constant       Last session before surgery  VAS : 5/10 worst : yesterday  AROM: flexion : 106 , abduction 98 with pain Quickdash : 59% work 75% Strength : 2+/5 due to pain    UBE backwards 4 minutes Lvl 2 for muscle contraction and capacity increase for improved posture    Seated: Scapular retractions via bringing arms from needs to hips to increase ROM and upright posture x 20  AAROM alphabet with UE ranger AAROM circles 10x clockwise, 10x counterclockwise with ranger cues for widening range and slowing velocity Scapular mobilizations: AP 10x, PA: 10x, upward rotation downward rotation 10x (PROM) improved mobility with repetition   Supine: wedge with roll under legs Distraction with rhythmic rotation 60 seconds x 5 trials PROM flexion 10x 10 second holds, abduction 10x 10 second holds, ER 10x 10 second holds, IR 8x 10 second holds Bicep lengthening with movement x 4 minutes PNF1 modified with PT assistance x15 PNF 2 modified with PT assistance x 15 STM anterior bicep/GH joint/ subscap with focus on pectoral musculature x 8 minutes Rotational mobilizations of shoulder with three point hold, focus on AP and inferior mobilization   Seated knee to back slides for IR.   Seated knee to abduction 10x                        PT Education - 03/25/18 0802    Education Details  exercise technique,manual, surgery     Person(s) Educated  Patient    Methods  Explanation;Demonstration;Verbal cues    Comprehension  Returned demonstration;Verbalized understanding       PT Short Term Goals - 03/25/18 0804      PT SHORT TERM GOAL #1   Title  Patient will be independent in home exercise program to improve strength/mobility for better functional independence with ADLs.    Baseline  HEP given 9/4 compliant 10/8: compliant    Time  2    Period  Weeks    Status  Achieved      PT SHORT TERM GOAL #2   Title  Patient will report a worst pain of 7/10 on VAS in  R  shoulder to improve tolerance with ADLs and reduced symptoms with activities.     Baseline  8/8: 9/10 9/4: 7-8/10 10/8: 7/10     Time  2    Period  Weeks    Status  Achieved        PT Long Term Goals - 03/24/18 1723      PT LONG TERM GOAL #1   Title  Patient will report a worst pain of 5/10 on VAS in    R shoulder         to improve tolerance with ADLs and reduced  symptoms with activities.     Baseline  8/8: 9/10 9/4: 7-8/10 10/7: 7/10 11/4: 5/10     Time  4    Period  Weeks    Status  Achieved      PT LONG TERM GOAL #2   Title  Patient will improve shoulder AROM to > 140 degrees of flexion, scaption, and abduction for improved ability to perform overhead activities.    Baseline  8/8: AROM standing: flexion 95, abduction 99 9/4: flexion 110 painful  abduction: 106 painful 10/7: R flexion 116 abduction 128 11/4:  flexion : 106 , abduction 98 with pain    Time  4    Period  Weeks    Status  Partially Met      PT LONG TERM GOAL #3   Title  Patient will decrease Quick DASH score by > 8 points (gross 78%, work 92%) demonstrating reduced self-reported upper extremity disability.    Baseline  8/8: 86% work 100% 9/5:  56.8%, work 75%      Time  4    Period  Weeks    Status  Achieved      PT LONG TERM GOAL #4   Title  Patient will demonstrate 4/5 strength in RUE to allow patient to return to work and perform job duties without compensations.     Baseline  8/8: strength limited by ROM and pain 9/4: abduction and flexion 2+/5 due to pain 10/7: 2+/5 abduction and flexion due to pain 11/4: 2+/5 due to pain     Time  4    Period  Weeks    Status  On-going      PT LONG TERM GOAL #5   Title  Patient will decrease Quick DASH score by > 8 points (gross 48.8%, work 67%) demonstrating reduced self-reported upper extremity disability.    Baseline  9/4: 56.8%, work 75%; 10/7: general:  61.4%  work 100% 11/5: 59% Work 75%    Time  4    Period  Weeks    Status  Partially Met             Plan - 03/25/18 0804    Clinical Impression Statement  Patient presents to last therapy session prior to surgery. Demonstrates understanding of importance of posture throughout recovery. Mobilizations of scapula result in pain reduction and postural correction. Patient will continue to benefit from skilled physical therapy for improvements in shoulder ROM, strength, and decreased pain    Rehab Potential  Fair    Clinical Impairments Affecting Rehab Potential  (+) age, educational level (-) secondary injury     PT Frequency  2x / week    PT Duration  4 weeks    PT Treatment/Interventions  ADLs/Self Care Home Management;Aquatic Therapy;Cryotherapy;Electrical Stimulation;Parrafin;Ultrasound;Traction;Moist Heat;Iontophoresis 78m/ml Dexamethasone;Functional mobility training;Therapeutic activities;Therapeutic exercise;Patient/family education;Neuromuscular re-education;Manual techniques;Manual lymph drainage;Compression bandaging;Taping;Energy conservation;Dry needling;Passive range of motion;Scar mobilization    PT Next Visit Plan  PNF, introduce active mobility     PT Home Exercise Plan  see sheet    Consulted and Agree with Plan of Care  Patient       Patient will benefit from skilled therapeutic intervention in order to improve the following deficits and impairments:  Decreased activity tolerance, Decreased coordination, Decreased endurance, Decreased mobility, Decreased range of motion, Decreased strength, Decreased scar mobility, Hypomobility, Increased edema, Impaired flexibility, Impaired perceived functional ability, Increased muscle spasms, Impaired UE functional use, Postural dysfunction, Improper body mechanics, Pain  Visit Diagnosis: Acute pain of right shoulder  Muscle weakness (generalized)     Problem List Patient Active Problem List   Diagnosis Date Noted  . Genetic testing 03/21/2018  . Family history of breast cancer 03/06/2018  . Family history of ovarian  cancer 03/06/2018  . Family history of esophageal cancer    Janna Arch, PT, DPT   03/25/2018, 8:07 AM  [place] MAIN [facility] SERVICES [address] [place], [place], [address] Phone: [phone]   Fax:  [phone]  Name: TEJAH BREKKE MRN: [id number] Date of Birth: [birth year]

## 2018-03-27 DIAGNOSIS — M7521 Bicipital tendinitis, right shoulder: Secondary | ICD-10-CM | POA: Diagnosis not present

## 2018-03-27 DIAGNOSIS — E119 Type 2 diabetes mellitus without complications: Secondary | ICD-10-CM | POA: Diagnosis not present

## 2018-03-27 DIAGNOSIS — M65811 Other synovitis and tenosynovitis, right shoulder: Secondary | ICD-10-CM | POA: Diagnosis not present

## 2018-03-27 DIAGNOSIS — M67929 Unspecified disorder of synovium and tendon, unspecified upper arm: Secondary | ICD-10-CM | POA: Diagnosis not present

## 2018-03-27 DIAGNOSIS — M19011 Primary osteoarthritis, right shoulder: Secondary | ICD-10-CM | POA: Diagnosis not present

## 2018-03-27 DIAGNOSIS — M7501 Adhesive capsulitis of right shoulder: Secondary | ICD-10-CM | POA: Diagnosis not present

## 2018-03-27 DIAGNOSIS — Z9889 Other specified postprocedural states: Secondary | ICD-10-CM | POA: Diagnosis not present

## 2018-03-27 DIAGNOSIS — K219 Gastro-esophageal reflux disease without esophagitis: Secondary | ICD-10-CM | POA: Diagnosis not present

## 2018-03-27 DIAGNOSIS — M24511 Contracture, right shoulder: Secondary | ICD-10-CM | POA: Diagnosis not present

## 2018-03-27 DIAGNOSIS — S43431A Superior glenoid labrum lesion of right shoulder, initial encounter: Secondary | ICD-10-CM | POA: Diagnosis not present

## 2018-04-01 DIAGNOSIS — Z9181 History of falling: Secondary | ICD-10-CM | POA: Diagnosis not present

## 2018-04-01 DIAGNOSIS — Z4789 Encounter for other orthopedic aftercare: Secondary | ICD-10-CM | POA: Diagnosis not present

## 2018-04-01 DIAGNOSIS — K219 Gastro-esophageal reflux disease without esophagitis: Secondary | ICD-10-CM | POA: Diagnosis not present

## 2018-04-01 DIAGNOSIS — E119 Type 2 diabetes mellitus without complications: Secondary | ICD-10-CM | POA: Diagnosis not present

## 2018-04-01 DIAGNOSIS — Z7984 Long term (current) use of oral hypoglycemic drugs: Secondary | ICD-10-CM | POA: Diagnosis not present

## 2018-04-04 DIAGNOSIS — Z4789 Encounter for other orthopedic aftercare: Secondary | ICD-10-CM | POA: Diagnosis not present

## 2018-04-04 DIAGNOSIS — Z7984 Long term (current) use of oral hypoglycemic drugs: Secondary | ICD-10-CM | POA: Diagnosis not present

## 2018-04-04 DIAGNOSIS — K219 Gastro-esophageal reflux disease without esophagitis: Secondary | ICD-10-CM | POA: Diagnosis not present

## 2018-04-04 DIAGNOSIS — E119 Type 2 diabetes mellitus without complications: Secondary | ICD-10-CM | POA: Diagnosis not present

## 2018-04-04 DIAGNOSIS — Z9181 History of falling: Secondary | ICD-10-CM | POA: Diagnosis not present

## 2018-04-08 DIAGNOSIS — E8940 Asymptomatic postprocedural ovarian failure: Secondary | ICD-10-CM | POA: Diagnosis not present

## 2018-04-08 DIAGNOSIS — I10 Essential (primary) hypertension: Secondary | ICD-10-CM | POA: Diagnosis not present

## 2018-04-08 DIAGNOSIS — S92351D Displaced fracture of fifth metatarsal bone, right foot, subsequent encounter for fracture with routine healing: Secondary | ICD-10-CM | POA: Diagnosis not present

## 2018-04-08 DIAGNOSIS — E119 Type 2 diabetes mellitus without complications: Secondary | ICD-10-CM | POA: Diagnosis not present

## 2018-04-09 DIAGNOSIS — Z7984 Long term (current) use of oral hypoglycemic drugs: Secondary | ICD-10-CM | POA: Diagnosis not present

## 2018-04-09 DIAGNOSIS — Z9181 History of falling: Secondary | ICD-10-CM | POA: Diagnosis not present

## 2018-04-09 DIAGNOSIS — K219 Gastro-esophageal reflux disease without esophagitis: Secondary | ICD-10-CM | POA: Diagnosis not present

## 2018-04-09 DIAGNOSIS — Z4789 Encounter for other orthopedic aftercare: Secondary | ICD-10-CM | POA: Diagnosis not present

## 2018-04-09 DIAGNOSIS — E119 Type 2 diabetes mellitus without complications: Secondary | ICD-10-CM | POA: Diagnosis not present

## 2018-04-11 DIAGNOSIS — K219 Gastro-esophageal reflux disease without esophagitis: Secondary | ICD-10-CM | POA: Diagnosis not present

## 2018-04-11 DIAGNOSIS — E119 Type 2 diabetes mellitus without complications: Secondary | ICD-10-CM | POA: Diagnosis not present

## 2018-04-11 DIAGNOSIS — Z7984 Long term (current) use of oral hypoglycemic drugs: Secondary | ICD-10-CM | POA: Diagnosis not present

## 2018-04-11 DIAGNOSIS — Z9181 History of falling: Secondary | ICD-10-CM | POA: Diagnosis not present

## 2018-04-11 DIAGNOSIS — Z4789 Encounter for other orthopedic aftercare: Secondary | ICD-10-CM | POA: Diagnosis not present

## 2018-04-14 DIAGNOSIS — E119 Type 2 diabetes mellitus without complications: Secondary | ICD-10-CM | POA: Diagnosis not present

## 2018-04-14 DIAGNOSIS — K219 Gastro-esophageal reflux disease without esophagitis: Secondary | ICD-10-CM | POA: Diagnosis not present

## 2018-04-14 DIAGNOSIS — Z7984 Long term (current) use of oral hypoglycemic drugs: Secondary | ICD-10-CM | POA: Diagnosis not present

## 2018-04-14 DIAGNOSIS — Z9181 History of falling: Secondary | ICD-10-CM | POA: Diagnosis not present

## 2018-04-14 DIAGNOSIS — Z4789 Encounter for other orthopedic aftercare: Secondary | ICD-10-CM | POA: Diagnosis not present

## 2018-04-14 DIAGNOSIS — Z13820 Encounter for screening for osteoporosis: Secondary | ICD-10-CM | POA: Diagnosis not present

## 2018-04-14 DIAGNOSIS — Z780 Asymptomatic menopausal state: Secondary | ICD-10-CM | POA: Diagnosis not present

## 2018-04-15 ENCOUNTER — Ambulatory Visit: Payer: Self-pay

## 2018-04-15 DIAGNOSIS — Z4789 Encounter for other orthopedic aftercare: Secondary | ICD-10-CM | POA: Diagnosis not present

## 2018-04-15 DIAGNOSIS — K219 Gastro-esophageal reflux disease without esophagitis: Secondary | ICD-10-CM | POA: Diagnosis not present

## 2018-04-15 DIAGNOSIS — E119 Type 2 diabetes mellitus without complications: Secondary | ICD-10-CM | POA: Diagnosis not present

## 2018-04-15 DIAGNOSIS — Z7984 Long term (current) use of oral hypoglycemic drugs: Secondary | ICD-10-CM | POA: Diagnosis not present

## 2018-04-15 DIAGNOSIS — Z9181 History of falling: Secondary | ICD-10-CM | POA: Diagnosis not present

## 2018-04-22 ENCOUNTER — Ambulatory Visit: Payer: Self-pay

## 2018-04-29 ENCOUNTER — Ambulatory Visit: Payer: 59 | Attending: Orthopedic Surgery

## 2018-04-29 DIAGNOSIS — M25511 Pain in right shoulder: Secondary | ICD-10-CM | POA: Diagnosis not present

## 2018-04-29 DIAGNOSIS — M6281 Muscle weakness (generalized): Secondary | ICD-10-CM | POA: Insufficient documentation

## 2018-04-29 NOTE — Therapy (Addendum)
[place] MAIN [facility] SERVICES [address] [place], [place], [address] Phone: [phone]   Fax:  [phone]  Physical Therapy Evaluation  Patient Details  Name: Charlene Daniels MRN: [id number] Date of Birth: [birth year] No data recorded  Encounter Date: 04/29/2018  PT End of Session - 04/29/18 1752    Visit Number  1    Number of Visits  20    Date for PT Re-Evaluation  05/29/18    Authorization - Number of Visits  20    PT Start Time  1648    PT Stop Time  1730    PT Time Calculation (min)  42 min    Activity Tolerance  Patient tolerated treatment well    Behavior During Therapy  [facility] for tasks assessed/performed       Past Medical History:  Diagnosis Date  . Arthritis   . Chronic kidney disease    on meds to protect kidney from DM  . Diabetes mellitus without complication ([place])   . Family history of breast cancer 03/06/2018  . Family history of esophageal cancer   . Family history of ovarian cancer 03/06/2018  . GERD (gastroesophageal reflux disease)   . Headache(784.0)   . PONV (postoperative nausea and vomiting)     Past Surgical History:  Procedure Laterality Date  . ABDOMINAL HYSTERECTOMY  2009  . CHOLECYSTECTOMY  1994  . DILATION AND CURETTAGE OF UTERUS  1994  . LYMPH NODE DISSECTION Right 1999   right neck cervical LN dissection after Cat-scratch fever  . SHOULDER ARTHROSCOPY WITH OPEN ROTATOR CUFF REPAIR Right 09/19/2017   Procedure: SHOULDER ARTHROSCOPY WITH OPEN ROTATOR CUFF REPAIR & SUBACROMINAL DECOMPRESSION.;  Surgeon: Thornton Park, MD;  Location: [facility] ORS;  Service: Orthopedics;  Laterality: Right;  . SHOULDER ARTHROSCOPY WITH ROTATOR CUFF REPAIR AND SUBACROMIAL DECOMPRESSION Left 02/12/2013   Procedure: LEFT SHOULDER ARTHROSCOPY WITH SUBACROMIAL DECOMPRESSION, DISTAL CLAVICLE RESECTION, POSSIBLE BICEP TENOTOMY, POSSIBLE ROTATOR CUFF REPAIR;  Surgeon: Marin Shutter, MD;  Location: [place];  Service: Orthopedics;   Laterality: Left;  . TONSILLECTOMY  1998    There were no vitals filed for this visit.   Subjective Assessment - 04/29/18 1657    Subjective  Patient came out of sling 3 days ago.  she had 4 PT appointments and feels that she is behind with ROM at this point.  She tried to sleep in her bed one night and could not find position for comfort.  She is back in recliner.    Pertinent History  Patient has has arthroscopic biceps tenodesis on 03/27/2018 since previously reported pertinent history. Patient was 8 weeks out right shoulder arthroscopy with mini open rotator cuff repair (performed 5/2)  when she moved her waist band feeling like "her arm had been ripped off". Went back to PT and her therapist felt like it had been retorn since she was not able to move arm. Mack Guise said MRI was false positive on tear, wanted pt to go to neurology thinking pain is from neck.  Was thinking it potentially could be complex regional pain syndrome. Patient wanted second opinion so went to [facility] in [place] to [employer]. Was not torn, anchor is lifted, where the repair is was not torn. Prior rotator cuff repair. Severe tendinosis of the supraspinatus tendon with a high-grade partial-thickness articular surface tear with a possible small full-thickness component. Severe tendinosis of the infraspinatus tendon.. Finished up steroid taper form Kransinski which helped with numb and ache.  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 Still not sleeping in bed ,wants to get out of recliner.   Patient has lymphedema on R  Side.     Limitations  Reading;Lifting;Sitting;Walking;House hold activities;Other (comment)    Diagnostic tests  MRI: Prior rotator cuff repair. Severe tendinosis of the    Patient Stated Goals  to reduce pain, return to work duties, improve ROM and strength     Pain Score  5     Pain Location  Arm    Pain Orientation  Right    Pain Descriptors / Indicators  Sore    Pain Onset  1 to 4 weeks ago       MMT  L UE: WNL  R UE: grip: 4/5 wrist flexion/extension: 4/5  elbow flex/ext: 2-/5 shoulder flexion/abduction: Not tested  Pronation/supination: not formally tested   AROM/PROM L UE: WNL  RUE: wrist flexion/extension, ulnar and radial deviation: WNL  elbow flexion/extension: WNL PROM only: Shoulder flexion: 130 degrees  PROM only shoulder abduction: 90 degrees with guarding Pronation/supination: WNL bilaterally, not formally tested  Manual Therapy  PROM shoulder flexion and abduction, pt in supine with increase of 40 degrees of flexion following PROM and application of heating pad x5 min.  Total manual therapy time 16 min  Therapeutic Exercise  Seated scapular retraction 10x3 sec Education regarding pendulums and recommended that pt try to perform using both arms due to coordination difficulties, provided pt can perform safely. x4 min  Education  Sleeping position, management of HEP, expectations for healing timeline and discussion of possibility of pt using a knee scooter.  Education provided during manual therapy to relax pt as she presented to be very anxious.  Pt becomes less anxious and is able to relax musculature as she is engaged in conversation.         Objective measurements completed on examination: See above findings.                PT Short Term Goals - 04/30/18 1105      PT SHORT TERM GOAL #1   Title  Patient will be independent in home exercise program to improve strength/mobility for better functional independence with ADLs.    Baseline  Patient is currently following HEP per post-op protocol which will need to be updated.    Time  2    Period  Weeks    Status  New      PT SHORT TERM GOAL #2   Title  Patient will report a worst pain of 3/10 on VAS in  R shoulder to improve tolerance with ADLs and reduced symptoms with activities.     Baseline  8/8: 9/10 9/4: 7-8/10 10/8: 7/10, 12/10: 6/10    Time  2    Period  Weeks    Status  New         PT Long Term Goals - 04/30/18 1107      PT LONG TERM GOAL #1   Title  Patient will report a worst pain of 4/10 on VAS in R shoulder to improve tolerance with ADLs and reduced symptoms with activities.     Baseline  8/8: 9/10 9/4: 7-8/10 10/7: 7/10 11/4: 5/10     Time  4    Period  Weeks    Status  New      PT LONG TERM GOAL #2   Title  Patient will improve shoulder AROM to > 160 degrees of flexion, scaption, and abduction for improved  ability to perform overhead activities.    Baseline  8/8: AROM standing: flexion 95, abduction 99 9/4: flexion 110 painful  abduction: 106 painful 10/7: R flexion 116 abduction 128 11/4:  flexion : 106 , abduction 98 with pain;  04/29/2018: supine: flexion: 125 degrees with pain and guarding, abduction: 89 degrees with pain    Time  4    Period  Weeks    Status  New      PT LONG TERM GOAL #3   Title  Patient will decrease Quick DASH score by > 8 points (gross 78%, work 92%) demonstrating reduced self-reported upper extremity disability.    Baseline  8/8: 86% work 100% 9/5:  56.8%, work 75%     Time  4    Period  Weeks    Status  Achieved      PT LONG TERM GOAL #4   Title  Patient will demonstrate 4/5 strength in RUE to allow patient to return to work and perform job duties without compensations.     Baseline  8/8: strength limited by ROM and pain 9/4: abduction and flexion 2+/5 due to pain 10/7: 2+/5 abduction and flexion due to pain 11/4: 2+/5 due to pain; 04/29/2018: elbow flexion/extension: 2-/5, Shoulder flexion: not tested due to post op protocol    Time  4    Period  Weeks    Status  New      PT LONG TERM GOAL #5   Title  Patient will decrease Quick DASH score by > 8 points (gross 48.8%, work 67%) demonstrating reduced self-reported upper extremity disability.    Baseline  9/4: 56.8%, work 75%; 10/7: general:  61.4%  work 100% 11/5: 59% Work 75%, 04/29/2018: 88%    Time  4    Period  Weeks    Status  New             Plan -  04/30/18 1104    Clinical Impression Statement  Pt arrived at PT evaluation today without shoulder immobilizer, stating that she gradually started decreasing use 3 days ago.  She is still wearing walking boot, which affects balance during standing exercises such as pendulums, and reports no change in LE status.  Pt was not open to use of knee scooter as suggested by PT.  Pt was able to demonstrate an understanding of all ther ex and expressed intent to perform daily.  R shoulder PROM improved significantly following PROM performed by therapist.  Pt will continue to benefit from skilled PT with focus on shoulder ROM, strength and functional mobility.    History and Personal Factors relevant to plan of care:  comorbidities: prior shoulder arthroscopies, arthritis, age    Clinical Presentation  Evolving    Clinical Decision Making  Moderate    Rehab Potential  Fair    Clinical Impairments Affecting Rehab Potential  (+) age, educational level (-) secondary injury     PT Frequency  2x / week    PT Duration  4 weeks    PT Treatment/Interventions  ADLs/Self Care Home Management;Aquatic Therapy;Cryotherapy;Electrical Stimulation;Parrafin;Ultrasound;Traction;Moist Heat;Iontophoresis 4mg /ml Dexamethasone;Functional mobility training;Therapeutic activities;Therapeutic exercise;Patient/family education;Neuromuscular re-education;Manual techniques;Manual lymph drainage;Compression bandaging;Taping;Energy conservation;Dry needling;Passive range of motion;Scar mobilization    PT Next Visit Plan  PNF, introduce active mobility     PT Home Exercise Plan  Protocol attached    Consulted and Agree with Plan of Care  Patient       Patient will benefit from skilled therapeutic intervention in order to improve the  following deficits and impairments:  Decreased activity tolerance, Decreased coordination, Decreased endurance, Decreased mobility, Decreased range of motion, Decreased strength, Decreased scar mobility,  Hypomobility, Increased edema, Impaired flexibility, Impaired perceived functional ability, Increased muscle spasms, Impaired UE functional use, Postural dysfunction, Improper body mechanics, Pain  Visit Diagnosis: Acute pain of right shoulder  Muscle weakness (generalized)     Problem List Patient Active Problem List   Diagnosis Date Noted  . Genetic testing 03/21/2018  . Family history of breast cancer 03/06/2018  . Family history of ovarian cancer 03/06/2018  . Family history of esophageal cancer    Roxanne Gates, PT, DPT  Roxanne Gates 04/30/2018, 11:44 AM  [place] MAIN [facility] SERVICES [address] [place], [place], [address] Phone: [phone]   Fax:  [phone]  Name: CHESNEE FLOREN MRN: [id number] Date of Birth: 11/16/1970

## 2018-04-30 NOTE — Addendum Note (Signed)
Addended byRoxanne Gates on: 04/30/2018 12:04 PM   Modules accepted: Orders

## 2018-05-01 ENCOUNTER — Ambulatory Visit: Payer: 59

## 2018-05-01 DIAGNOSIS — M25511 Pain in right shoulder: Secondary | ICD-10-CM | POA: Diagnosis not present

## 2018-05-01 DIAGNOSIS — M6281 Muscle weakness (generalized): Secondary | ICD-10-CM

## 2018-05-02 NOTE — Therapy (Signed)
[place] MAIN [facility] SERVICES [address] [place], [place], [address] Phone: [phone]   Fax:  [phone]  Physical Therapy Treatment  Patient Details  Name: Charlene Daniels MRN: [id number] Date of Birth: 1971-05-01 No data recorded  Encounter Date: 05/01/2018  PT End of Session - 05/05/18 1300    Visit Number  2    Number of Visits  20    Date for PT Re-Evaluation  05/29/18    Authorization Type  Progress Note: 2/10, last goals: 04/29/18    PT Start Time  1300    PT Stop Time  1345    PT Time Calculation (min)  45 min    Activity Tolerance  Patient tolerated treatment well    Behavior During Therapy  [facility] for tasks assessed/performed       Past Medical History:  Diagnosis Date  . Arthritis   . Chronic kidney disease    on meds to protect kidney from DM  . Diabetes mellitus without complication ([place])   . Family history of breast cancer 03/06/2018  . Family history of esophageal cancer   . Family history of ovarian cancer 03/06/2018  . GERD (gastroesophageal reflux disease)   . Headache(784.0)   . PONV (postoperative nausea and vomiting)     Past Surgical History:  Procedure Laterality Date  . ABDOMINAL HYSTERECTOMY  2009  . CHOLECYSTECTOMY  1994  . DILATION AND CURETTAGE OF UTERUS  1994  . LYMPH NODE DISSECTION Right 1999   right neck cervical LN dissection after Cat-scratch fever  . SHOULDER ARTHROSCOPY WITH OPEN ROTATOR CUFF REPAIR Right 09/19/2017   Procedure: SHOULDER ARTHROSCOPY WITH OPEN ROTATOR CUFF REPAIR & SUBACROMINAL DECOMPRESSION.;  Surgeon: Thornton Park, MD;  Location: [facility] ORS;  Service: Orthopedics;  Laterality: Right;  . SHOULDER ARTHROSCOPY WITH ROTATOR CUFF REPAIR AND SUBACROMIAL DECOMPRESSION Left 02/12/2013   Procedure: LEFT SHOULDER ARTHROSCOPY WITH SUBACROMIAL DECOMPRESSION, DISTAL CLAVICLE RESECTION, POSSIBLE BICEP TENOTOMY, POSSIBLE ROTATOR CUFF REPAIR;  Surgeon: Marin Shutter, MD;  Location: [place];  Service: Orthopedics;  Laterality: Left;  . TONSILLECTOMY  1998    There were no vitals filed for this visit.  Subjective Assessment - 05/05/18 1247    Subjective  Patient came out of sling 3 days ago.  she had 4 PT appointments and feels that she is behind with ROM at this point.  She tried to sleep in her bed one night and could not find position for comfort.  She is back in recliner. She is currently in week 5 of her post-op recovery.     Pertinent History  Patient has has arthroscopic biceps tenodesis on 03/27/2018 since previously reported pertinent history. Patient was 8 weeks out right shoulder arthroscopy with mini open rotator cuff repair (performed 5/2)  when she moved her waist band feeling like "her arm had been ripped off". Went back to PT and her therapist felt like it had been retorn since she was not able to move arm. Mack Guise said MRI was false positive on tear, wanted pt to go to neurology thinking pain is from neck.  Was thinking it potentially could be complex regional pain syndrome. Patient wanted second opinion so went to [facility] in [place] to [employer]. Was not torn, anchor is lifted, where the repair is was not torn. Prior rotator cuff repair. Severe tendinosis of the supraspinatus tendon with a high-grade partial-thickness articular surface tear with a possible small full-thickness component. Severe tendinosis of the infraspinatus tendon.[name]  up steroid taper form Kransinski which helped with numb and ache.   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   Patient has lymphedema on R  Side.     Limitations  Reading;Lifting;Sitting;Walking;House hold activities;Other (comment)    Diagnostic tests  MRI: Prior rotator cuff repair. Severe tendinosis of the    Patient Stated Goals  to reduce pain, return to work duties, improve ROM and strength     Currently in Pain?  No/denies          TREATMENT   Manual  Therapy  PROM of R shoulder for flexion, abduction, ER, and IR with end range holds x multiple bouts in each direction; Rhythmic perturbation mobilization R shoulder while moving through passive scaption to decrease tone and guarding in shoulder; Gentle R shoulder distraction mobilization for pain and guarding; Grade I R shoulder A/P mobilizations, 30s/bout x 4 bouts; L sidelying R scapula mobilizations superior, inferior, retraction, protraction, and upward rotation for relaxation of musculature; Light STM to anterior, lateral, and posterior R shoulder, patient initially very tender to light palpation but improves throughout session; R elbow PROM gentle extension stretch 30s hold x 3;   Ther-ex  R shoulder AROM flexion to available pain-free end range x 10; R shoulder AROM scaption to available pain-free range x 10; R elbow AROM flexion/extension, pain-free x 10; Reviewed HEP for scapular retractions and AAROM/AROM of shoulder, elbow, wrist, and hand as well as R elbow PROM gentle stretches. Pt encouraged to avoid all painful motions and no lifting.      Pt educated throughout session about proper posture and technique with exercises. Improved exercise technique, movement at target joints, use of target muscles after min to mod verbal, visual, tactile cues.    Pt demonstrates excellent motivation with therapy today. She has less anxiety related to movement today. She is able to tolerate gentle PROM and progress to pain-free AROM as permitted by protocol without increase in pain. Reviewed HEP for scapular retractions and AAROM/AROM of shoulder, elbow, wrist, and hand as well as R elbow PROM gentle stretches. Pt encouraged to avoid all painful motions and no lifting. Pt will benefit from PT services to address deficits in strength, balance, and mobility in order to return to full function at home.                      PT Short Term Goals - 05/02/18 1345      PT SHORT TERM  GOAL #1   Title  Patient will be independent in home exercise program to improve strength/mobility for better functional independence with ADLs.    Baseline  Patient is currently following HEP per post-op protocol which will need to be updated.    Time  2    Period  Weeks    Status  New    Target Date  05/14/18      PT SHORT TERM GOAL #2   Title  Patient will report a worst pain of 3/10 on VAS in  R shoulder to improve tolerance with ADLs and reduced symptoms with activities.     Baseline  04/29/18: 6/10    Time  2    Period  Weeks    Status  New    Target Date  05/14/18        PT Long Term Goals - 05/02/18 1346      PT LONG TERM GOAL #1  Title  Patient will report a worst pain of 4/10 on VAS in R shoulder to improve tolerance with ADLs and reduced symptoms with activities.     Baseline  04/29/18: 6/10    Time  4    Period  Weeks    Status  New    Target Date  05/28/18      PT LONG TERM GOAL #2   Title  Patient will improve shoulder AROM to > 160 degrees of flexion, scaption, and abduction for improved ability to perform overhead activities.    Baseline  04/29/2018: supine: flexion: 125 degrees with pain and guarding, abduction: 89 degrees with pain    Time  4    Period  Weeks    Status  New    Target Date  05/28/18      PT LONG TERM GOAL #3   Title  Patient will decrease Quick DASH score by > 8 points demonstrating reduced self-reported upper extremity disability.    Baseline  04/29/2018: 88%    Time  4    Period  Weeks    Status  New    Target Date  05/28/18      PT LONG TERM GOAL #4   Title  Patient will demonstrate 4/5 strength in RUE to allow patient to return to work and perform job duties without compensations.     Baseline  04/29/2018: elbow flexion/extension: 2-/5, Shoulder flexion: not tested due to post op protocol    Time  4    Period  Weeks    Status  New    Target Date  05/28/18      PT LONG TERM GOAL #5   Title  --    Baseline  --    Time  --     Period  --    Status  --            Plan - 05/05/18 1251    Clinical Impression Statement  Pt demonstrates excellent motivation with therapy today. She has less anxiety related to movement today. She is able to tolerate gentle PROM and progress to pain-free AROM as permitted by protocol without increase in pain. Reviewed HEP for scapular retractions and AAROM/AROM of shoulder, elbow, wrist, and hand as well as R elbow PROM gentle stretches. Pt encouraged to avoid all painful motions and no lifting. Pt will benefit from PT services to address deficits in strength, balance, and mobility in order to return to full function at home.     Rehab Potential  Fair    Clinical Impairments Affecting Rehab Potential  (+) age, educational level (-) secondary injury     PT Frequency  2x / week    PT Duration  4 weeks    PT Treatment/Interventions  ADLs/Self Care Home Management;Aquatic Therapy;Cryotherapy;Electrical Stimulation;Parrafin;Ultrasound;Traction;Moist Heat;Iontophoresis 4mg /ml Dexamethasone;Functional mobility training;Therapeutic activities;Therapeutic exercise;Patient/family education;Neuromuscular re-education;Manual techniques;Manual lymph drainage;Compression bandaging;Taping;Energy conservation;Dry needling;Passive range of motion;Scar mobilization    PT Next Visit Plan  PROM, AAROM, and AROM as appropriate and pain-free    PT Home Exercise Plan  Protocol attached, scapular retractions, AAROM/AROM of R shoulder for flexion/abduction, AAROM for ER, unweighted AROM R elbow flex/ext (no lifting), passive R elbow stretching, AROM for forearm supination, as well as wrist flex/ext    Consulted and Agree with Plan of Care  Patient       Patient will benefit from skilled therapeutic intervention in order to improve the following deficits and impairments:  Decreased activity tolerance, Decreased coordination, Decreased endurance,  Decreased mobility, Decreased range of motion, Decreased strength,  Decreased scar mobility, Hypomobility, Increased edema, Impaired flexibility, Impaired perceived functional ability, Increased muscle spasms, Impaired UE functional use, Postural dysfunction, Improper body mechanics, Pain  Visit Diagnosis: Acute pain of right shoulder  Muscle weakness (generalized)     Problem List Patient Active Problem List   Diagnosis Date Noted  . Genetic testing 03/21/2018  . Family history of breast cancer 03/06/2018  . Family history of ovarian cancer 03/06/2018  . Family history of esophageal cancer    Phillips Grout PT, DPT, GCS  Talullah Abate 05/05/2018, 1:01 PM  [place] MAIN [facility] SERVICES [address] [place], [place], [address] Phone: [phone]   Fax:  [phone]  Name: SAMANVI CUCCIA MRN: [id number] Date of Birth: Nov 29, 1970

## 2018-05-06 ENCOUNTER — Ambulatory Visit: Payer: 59

## 2018-05-06 DIAGNOSIS — M6281 Muscle weakness (generalized): Secondary | ICD-10-CM | POA: Diagnosis not present

## 2018-05-06 DIAGNOSIS — M25511 Pain in right shoulder: Secondary | ICD-10-CM

## 2018-05-06 NOTE — Therapy (Signed)
[place] MAIN [facility] SERVICES [address] [place], [place], [address] Phone: [phone]   Fax:  [phone]  Physical Therapy Treatment  Patient Details  Name: Charlene Daniels MRN: [id number] Date of Birth: Feb 19, 1971 No data recorded  Encounter Date: 05/06/2018    PT End of Session - 05/07/18 1244    Visit Number  3    Number of Visits  20    Date for PT Re-Evaluation  05/29/18    Authorization Type  Progress Note: 3/10, last goals: 04/29/18    PT Start Time  1647    PT Stop Time  1735    PT Time Calculation (min)  48 min    Activity Tolerance  Patient tolerated treatment well    Behavior During Therapy  [facility] for tasks assessed/performed       Past Medical History:  Diagnosis Date  . Arthritis   . Chronic kidney disease    on meds to protect kidney from DM  . Diabetes mellitus without complication ([place])   . Family history of breast cancer 03/06/2018  . Family history of esophageal cancer   . Family history of ovarian cancer 03/06/2018  . GERD (gastroesophageal reflux disease)   . Headache(784.0)   . PONV (postoperative nausea and vomiting)     Past Surgical History:  Procedure Laterality Date  . ABDOMINAL HYSTERECTOMY  2009  . CHOLECYSTECTOMY  1994  . DILATION AND CURETTAGE OF UTERUS  1994  . LYMPH NODE DISSECTION Right 1999   right neck cervical LN dissection after Cat-scratch fever  . SHOULDER ARTHROSCOPY WITH OPEN ROTATOR CUFF REPAIR Right 09/19/2017   Procedure: SHOULDER ARTHROSCOPY WITH OPEN ROTATOR CUFF REPAIR & SUBACROMINAL DECOMPRESSION.;  Surgeon: Thornton Park, MD;  Location: [facility];  Service: Orthopedics;  Laterality: Right;  . SHOULDER ARTHROSCOPY WITH ROTATOR CUFF REPAIR AND SUBACROMIAL DECOMPRESSION Left 02/12/2013   Procedure: LEFT SHOULDER ARTHROSCOPY WITH SUBACROMIAL DECOMPRESSION, DISTAL CLAVICLE RESECTION, POSSIBLE BICEP TENOTOMY, POSSIBLE ROTATOR CUFF REPAIR;  Surgeon: Marin Shutter, MD;  Location: [place];  Service: Orthopedics;  Laterality: Left;  . TONSILLECTOMY  1998    There were no vitals filed for this visit.  Subjective Assessment - 05/06/18 1718    Subjective  Patient states she is doing well at this time. She reports continued improvement since the last session and has not had any pain with AROM of the R shoulder. No specific questions or concerns.     Pertinent History  Patient has has arthroscopic biceps tenodesis on 03/27/2018 since previously reported pertinent history. Patient was 8 weeks out right shoulder arthroscopy with mini open rotator cuff repair (performed 5/2)  when she moved her waist band feeling like "her arm had been ripped off". Went back to PT and her therapist felt like it had been retorn since she was not able to move arm. Mack Guise said MRI was false positive on tear, wanted pt to go to neurology thinking pain is from neck.  Was thinking it potentially could be complex regional pain syndrome. Patient wanted second opinion so went to [facility] in [place] to [employer]. Was not torn, anchor is lifted, where the repair is was not torn. Prior rotator cuff repair. Severe tendinosis of the supraspinatus tendon with a high-grade partial-thickness articular surface tear with a possible small full-thickness component. Severe tendinosis of the infraspinatus tendon.. Finished up steroid taper form Kransinski which helped with numb and ache.   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 Still  not sleeping in bed ,wants to get out of recliner.   Patient has lymphedema on R  Side.     Limitations  Reading;Lifting;Sitting;Walking;House hold activities;Other (comment)    Diagnostic tests  MRI: Prior rotator cuff repair. Severe tendinosis of the    Patient Stated Goals  to reduce pain, return to work duties, improve ROM and strength     Currently in Pain?  No/denies           TREATMENT   Manual Therapy  PROM of R shoulder for flexion, abduction, ER, and IR with end range  holds x multiple bouts in each direction; Rhythmic perturbation mobilization R shoulder while moving through passive scaption to decrease tone and guarding in shoulder; Gentle R shoulder distraction mobilization for pain and guarding; Grade I, R shoulder A/P mobilizations, 30s/bout x 4 bouts; L sidelying R scapula mobilizations superior, inferior, retraction, protraction, and upward rotation for relaxation of musculature; Light STM to anterior, lateral, and posterior R shoulder, patient initially very tender to light palpation but improves throughout session; R elbow PROM gentle extension stretch 30s hold x 3;   Ther-ex  R shoulder AROM flexion to available pain-free end range x 10; R shoulder AROM scaption to available pain-free range x 10; R elbow AROM flexion/extension, pain-free x 10; L sidelying R shoulder AROM abduction, pain-free x 10; L sidelying R shoulder AROM ER, painfree x 10; Reviewed HEP for scapular retractions and AAROM/AROM of shoulder, elbow, wrist, and hand as well as R elbow PROM gentle extension stretches. Pt encouraged to avoid all painful motions and no lifting.                Pt educated throughout session about proper posture and technique with exercises. Improved exercise technique, movement at target joints, use of target muscles after min to mod verbal, visual, tactile cues.    Pt demonstrates excellent motivation with therapy today. She continues to report improvement in her R shoulder motion as well as decrease in her pain. Her anxiety is notably decreased today upon arrival regarding movement of her RUE. Pt encouraged to avoid all painful motions and no lifting. Continue HEP including AROM. Pt will benefit from PT services to address deficits in strength, balance, and mobility in order to return to full function at home.                         PT Short Term Goals - 05/02/18 1345      PT SHORT TERM GOAL #1   Title  Patient will be  independent in home exercise program to improve strength/mobility for better functional independence with ADLs.    Baseline  Patient is currently following HEP per post-op protocol which will need to be updated.    Time  2    Period  Weeks    Status  New    Target Date  05/14/18      PT SHORT TERM GOAL #2   Title  Patient will report a worst pain of 3/10 on VAS in  R shoulder to improve tolerance with ADLs and reduced symptoms with activities.     Baseline  04/29/18: 6/10    Time  2    Period  Weeks    Status  New    Target Date  05/14/18        PT Long Term Goals - 05/02/18 1346      PT LONG TERM GOAL #1   Title  Patient will report a worst pain of 4/10 on VAS in R shoulder to improve tolerance with ADLs and reduced symptoms with activities.     Baseline  04/29/18: 6/10    Time  4    Period  Weeks    Status  New    Target Date  05/28/18      PT LONG TERM GOAL #2   Title  Patient will improve shoulder AROM to > 160 degrees of flexion, scaption, and abduction for improved ability to perform overhead activities.    Baseline  04/29/2018: supine: flexion: 125 degrees with pain and guarding, abduction: 89 degrees with pain    Time  4    Period  Weeks    Status  New    Target Date  05/28/18      PT LONG TERM GOAL #3   Title  Patient will decrease Quick DASH score by > 8 points demonstrating reduced self-reported upper extremity disability.    Baseline  04/29/2018: 88%    Time  4    Period  Weeks    Status  New    Target Date  05/28/18      PT LONG TERM GOAL #4   Title  Patient will demonstrate 4/5 strength in RUE to allow patient to return to work and perform job duties without compensations.     Baseline  04/29/2018: elbow flexion/extension: 2-/5, Shoulder flexion: not tested due to post op protocol    Time  4    Period  Weeks    Status  New    Target Date  05/28/18      PT LONG TERM GOAL #5   Title  --    Baseline  --    Time  --    Period  --    Status  --             Plan - 05/07/18 1245    Clinical Impression Statement  Pt demonstrates excellent motivation with therapy today. She continues to report improvement in her R shoulder motion as well as decrease in her pain. Her anxiety is notably decreased today upon arrival regarding movement of her RUE. Pt encouraged to avoid all painful motions and no lifting. Continue HEP including AROM. Pt will benefit from PT services to address deficits in strength, balance, and mobility in order to return to full function at home.     Rehab Potential  Fair    Clinical Impairments Affecting Rehab Potential  (+) age, educational level (-) secondary injury     PT Frequency  2x / week    PT Duration  4 weeks    PT Treatment/Interventions  ADLs/Self Care Home Management;Aquatic Therapy;Cryotherapy;Electrical Stimulation;Parrafin;Ultrasound;Traction;Moist Heat;Iontophoresis 4mg /ml Dexamethasone;Functional mobility training;Therapeutic activities;Therapeutic exercise;Patient/family education;Neuromuscular re-education;Manual techniques;Manual lymph drainage;Compression bandaging;Taping;Energy conservation;Dry needling;Passive range of motion;Scar mobilization    PT Next Visit Plan  PROM, AAROM, and AROM as appropriate and pain-free    PT Home Exercise Plan  Protocol attached, scapular retractions, AAROM/AROM of R shoulder for flexion/abduction, AAROM for ER, unweighted AROM R elbow flex/ext (no lifting), passive R elbow stretching, AROM for forearm supination, as well as wrist flex/ext    Consulted and Agree with Plan of Care  Patient       Patient will benefit from skilled therapeutic intervention in order to improve the following deficits and impairments:  Decreased activity tolerance, Decreased coordination, Decreased endurance, Decreased mobility, Decreased range of motion, Decreased strength, Decreased scar mobility, Hypomobility, Increased edema, Impaired flexibility, Impaired  perceived functional ability,  Increased muscle spasms, Impaired UE functional use, Postural dysfunction, Improper body mechanics, Pain  Visit Diagnosis: Acute pain of right shoulder  Muscle weakness (generalized)     Problem List Patient Active Problem List   Diagnosis Date Noted  . Genetic testing 03/21/2018  . Family history of breast cancer 03/06/2018  . Family history of ovarian cancer 03/06/2018  . Family history of esophageal cancer     Kyra Laffey 05/07/2018, 12:52 PM  [place] MAIN [facility] SERVICES [address] [place], [place], [address] Phone: [phone]   Fax:  [phone]  Name: SHARIFA BUCHOLZ MRN: [id number] Date of Birth: Oct 04, 1970

## 2018-05-08 ENCOUNTER — Ambulatory Visit: Payer: 59

## 2018-05-08 DIAGNOSIS — M25511 Pain in right shoulder: Secondary | ICD-10-CM

## 2018-05-08 DIAGNOSIS — M6281 Muscle weakness (generalized): Secondary | ICD-10-CM

## 2018-05-09 NOTE — Therapy (Signed)
[place] MAIN [facility] SERVICES [address] [place], [place], [address] Phone: [phone]   Fax:  [phone]  Physical Therapy Treatment  Patient Details  Name: Charlene Daniels MRN: [id number] Date of Birth: 22-Jan-1971 No data recorded  Encounter Date: 05/08/2018  PT End of Session - 05/09/18 2221    Visit Number  4    Number of Visits  20    Date for PT Re-Evaluation  05/29/18    Authorization Type  Progress Note: 4/10, last goals: 04/29/18    PT Start Time  1647    PT Stop Time  1735    PT Time Calculation (min)  48 min    Activity Tolerance  Patient tolerated treatment well    Behavior During Therapy  [facility] for tasks assessed/performed       Past Medical History:  Diagnosis Date  . Arthritis   . Chronic kidney disease    on meds to protect kidney from DM  . Diabetes mellitus without complication ([place])   . Family history of breast cancer 03/06/2018  . Family history of esophageal cancer   . Family history of ovarian cancer 03/06/2018  . GERD (gastroesophageal reflux disease)   . Headache(784.0)   . PONV (postoperative nausea and vomiting)     Past Surgical History:  Procedure Laterality Date  . ABDOMINAL HYSTERECTOMY  2009  . CHOLECYSTECTOMY  1994  . DILATION AND CURETTAGE OF UTERUS  1994  . LYMPH NODE DISSECTION Right 1999   right neck cervical LN dissection after Cat-scratch fever  . SHOULDER ARTHROSCOPY WITH OPEN ROTATOR CUFF REPAIR Right 09/19/2017   Procedure: SHOULDER ARTHROSCOPY WITH OPEN ROTATOR CUFF REPAIR & SUBACROMINAL DECOMPRESSION.;  Surgeon: Thornton Park, MD;  Location: [facility];  Service: Orthopedics;  Laterality: Right;  . SHOULDER ARTHROSCOPY WITH ROTATOR CUFF REPAIR AND SUBACROMIAL DECOMPRESSION Left 02/12/2013   Procedure: LEFT SHOULDER ARTHROSCOPY WITH SUBACROMIAL DECOMPRESSION, DISTAL CLAVICLE RESECTION, POSSIBLE BICEP TENOTOMY, POSSIBLE ROTATOR CUFF REPAIR;  Surgeon: Marin Shutter, MD;  Location: [place];  Service: Orthopedics;  Laterality: Left;  . TONSILLECTOMY  1998    There were no vitals filed for this visit.  Subjective Assessment - 05/09/18 2221    Subjective  Patient states she is doing well at this time. She reports continued improvement since the last session and has not had any pain with AROM of the R shoulder. No specific questions or concerns.     Pertinent History  Patient has has arthroscopic biceps tenodesis on 03/27/2018 since previously reported pertinent history. Patient was 8 weeks out right shoulder arthroscopy with mini open rotator cuff repair (performed 5/2)  when she moved her waist band feeling like "her arm had been ripped off". Went back to PT and her therapist felt like it had been retorn since she was not able to move arm. Mack Guise said MRI was false positive on tear, wanted pt to go to neurology thinking pain is from neck.  Was thinking it potentially could be complex regional pain syndrome. Patient wanted second opinion so went to [facility] in [place] to [employer]. Was not torn, anchor is lifted, where the repair is was not torn. Prior rotator cuff repair. Severe tendinosis of the supraspinatus tendon with a high-grade partial-thickness articular surface tear with a possible small full-thickness component. Severe tendinosis of the infraspinatus tendon.. Finished up steroid taper form Kransinski which helped with numb and ache.   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   Patient has lymphedema on R  Side.     Limitations  Reading;Lifting;Sitting;Walking;House hold activities;Other (comment)    Diagnostic tests  MRI: Prior rotator cuff repair. Severe tendinosis of the    Patient Stated Goals  to reduce pain, return to work duties, improve ROM and strength     Currently in Pain?  No/denies          TREATMENT   Manual Therapy PROM of R shoulder for flexion, abduction, ER, and IR with end range holds  x multiple bouts in each direction; Rhythmic perturbationmobilization R shoulder while moving through passive scaption to decrease tone and guarding in shoulder; Gentle R shoulder distraction mobilization for pain and guarding; Grade I, R shoulder A/P mobilizations, 30s/bout x 4 bouts; L sidelying R scapula mobilizations superior, inferior, retraction, protraction, and upward rotation for relaxation of musculature; R elbow PROM gentle extension stretch 30s hold x 3;   Ther-ex R shoulder AROM flexion to available pain-free end range x 10; R shoulder AROM scaption to available pain-free range x 10; R elbow AROM flexion/extension, pain-free x 10; L sidelying R shoulder AROM abduction, pain-free x 10; L sidelying R shoulder AROM ER, painfree x 10; Seated scapular retractions 5s hold x 10; Prone R shoulder to neutral 2 x 10; Prone R shoulder horizontal abduction (T's) 2 x 10 with cues to retract scapula; Prone R shoulder scaption (Y's) 2 x 10 with cues to retract scapula R shoulder isometrics for flexion, extension, abduction, adduction, IR, and ER 5s hold x 10 each (added to HEP);   Pt educated throughout session about proper posture and technique with exercises. Improved exercise technique, movement at target joints, use of target muscles after min to mod verbal, visual, tactile cues.   Pt demonstrates excellent motivation with therapy today. She continues to report improvement in her R shoulder motion as well as decrease in her pain. Progressed to week 6 of protocol which includes scapular strengthening and shoulder isometrics. Added R shoulder isometrics to patient's HEP. Pt will benefit from PT services to address deficits in strength, balance, and mobility in order to return to full function at home.                        PT Short Term Goals - 05/02/18 1345      PT SHORT TERM GOAL #1   Title  Patient will be independent in home exercise program to improve  strength/mobility for better functional independence with ADLs.    Baseline  Patient is currently following HEP per post-op protocol which will need to be updated.    Time  2    Period  Weeks    Status  New    Target Date  05/14/18      PT SHORT TERM GOAL #2   Title  Patient will report a worst pain of 3/10 on VAS in  R shoulder to improve tolerance with ADLs and reduced symptoms with activities.     Baseline  04/29/18: 6/10    Time  2    Period  Weeks    Status  New    Target Date  05/14/18        PT Long Term Goals - 05/02/18 1346      PT LONG TERM GOAL #1   Title  Patient will report a worst pain of 4/10 on VAS in R shoulder to improve tolerance with ADLs and  reduced symptoms with activities.     Baseline  04/29/18: 6/10    Time  4    Period  Weeks    Status  New    Target Date  05/28/18      PT LONG TERM GOAL #2   Title  Patient will improve shoulder AROM to > 160 degrees of flexion, scaption, and abduction for improved ability to perform overhead activities.    Baseline  04/29/2018: supine: flexion: 125 degrees with pain and guarding, abduction: 89 degrees with pain    Time  4    Period  Weeks    Status  New    Target Date  05/28/18      PT LONG TERM GOAL #3   Title  Patient will decrease Quick DASH score by > 8 points demonstrating reduced self-reported upper extremity disability.    Baseline  04/29/2018: 88%    Time  4    Period  Weeks    Status  New    Target Date  05/28/18      PT LONG TERM GOAL #4   Title  Patient will demonstrate 4/5 strength in RUE to allow patient to return to work and perform job duties without compensations.     Baseline  04/29/2018: elbow flexion/extension: 2-/5, Shoulder flexion: not tested due to post op protocol    Time  4    Period  Weeks    Status  New    Target Date  05/28/18      PT LONG TERM GOAL #5   Title  --    Baseline  --    Time  --    Period  --    Status  --            Plan - 05/09/18 2222    Clinical  Impression Statement  Pt demonstrates excellent motivation with therapy today. She continues to report improvement in her R shoulder motion as well as decrease in her pain. Progressed to week 6 of protocol which includes scapular strengthening and shoulder isometrics. Added R shoulder isometrics to patient's HEP. Pt will benefit from PT services to address deficits in strength, balance, and mobility in order to return to full function at home.    Rehab Potential  Fair    Clinical Impairments Affecting Rehab Potential  (+) age, educational level (-) secondary injury     PT Frequency  2x / week    PT Duration  4 weeks    PT Treatment/Interventions  ADLs/Self Care Home Management;Aquatic Therapy;Cryotherapy;Electrical Stimulation;Parrafin;Ultrasound;Traction;Moist Heat;Iontophoresis 4mg /ml Dexamethasone;Functional mobility training;Therapeutic activities;Therapeutic exercise;Patient/family education;Neuromuscular re-education;Manual techniques;Manual lymph drainage;Compression bandaging;Taping;Energy conservation;Dry needling;Passive range of motion;Scar mobilization    PT Next Visit Plan  PROM, AAROM, and AROM as appropriate and pain-free    PT Home Exercise Plan  Protocol attached, scapular retractions, AAROM/AROM of R shoulder for flexion/abduction, AAROM for ER, unweighted AROM R elbow flex/ext (no lifting), passive R elbow stretching, AROM for forearm supination, as well as wrist flex/ext, isometrics for R shoudler including flexion, extension, abduction, adduction, IR, and ER.    Consulted and Agree with Plan of Care  Patient       Patient will benefit from skilled therapeutic intervention in order to improve the following deficits and impairments:  Decreased activity tolerance, Decreased coordination, Decreased endurance, Decreased mobility, Decreased range of motion, Decreased strength, Decreased scar mobility, Hypomobility, Increased edema, Impaired flexibility, Impaired perceived functional  ability, Increased muscle spasms, Impaired UE functional use, Postural dysfunction, Improper body  mechanics, Pain  Visit Diagnosis: Acute pain of right shoulder  Muscle weakness (generalized)     Problem List Patient Active Problem List   Diagnosis Date Noted  . Genetic testing 03/21/2018  . Family history of breast cancer 03/06/2018  . Family history of ovarian cancer 03/06/2018  . Family history of esophageal cancer    Phillips Grout PT, DPT, GCS  Eldrick Penick 05/09/2018, 10:30 PM  [place] MAIN [facility] SERVICES [address] [place], [place], [address] Phone: [phone]   Fax:  [phone]  Name: IRINI LEET MRN: [id number] Date of Birth: 1970/06/17

## 2018-05-16 ENCOUNTER — Ambulatory Visit: Payer: 59

## 2018-05-16 DIAGNOSIS — S92351D Displaced fracture of fifth metatarsal bone, right foot, subsequent encounter for fracture with routine healing: Secondary | ICD-10-CM | POA: Diagnosis not present

## 2018-05-16 DIAGNOSIS — S92351G Displaced fracture of fifth metatarsal bone, right foot, subsequent encounter for fracture with delayed healing: Secondary | ICD-10-CM | POA: Diagnosis not present

## 2018-05-16 DIAGNOSIS — M6281 Muscle weakness (generalized): Secondary | ICD-10-CM | POA: Diagnosis not present

## 2018-05-16 DIAGNOSIS — M25511 Pain in right shoulder: Secondary | ICD-10-CM

## 2018-05-16 NOTE — Therapy (Signed)
[place] MAIN [facility] SERVICES [address] [place], [place], [address] Phone: [phone]   Fax:  [phone]  Physical Therapy Treatment  Patient Details  Name: Charlene Daniels MRN: [id number] Date of Birth: 04-Nov-1970 No data recorded  Encounter Date: 05/16/2018  PT End of Session - 05/16/18 1252    Visit Number  5    Number of Visits  20    Date for PT Re-Evaluation  05/29/18    Authorization Type  Progress Note: 5/10, last goals: 04/29/18    PT Start Time  1132    PT Stop Time  1201    PT Time Calculation (min)  29 min    Activity Tolerance  Patient tolerated treatment well    Behavior During Therapy  [facility] for tasks assessed/performed       Past Medical History:  Diagnosis Date  . Arthritis   . Chronic kidney disease    on meds to protect kidney from DM  . Diabetes mellitus without complication ([place])   . Family history of breast cancer 03/06/2018  . Family history of esophageal cancer   . Family history of ovarian cancer 03/06/2018  . GERD (gastroesophageal reflux disease)   . Headache(784.0)   . PONV (postoperative nausea and vomiting)     Past Surgical History:  Procedure Laterality Date  . ABDOMINAL HYSTERECTOMY  2009  . CHOLECYSTECTOMY  1994  . DILATION AND CURETTAGE OF UTERUS  1994  . LYMPH NODE DISSECTION Right 1999   right neck cervical LN dissection after Cat-scratch fever  . SHOULDER ARTHROSCOPY WITH OPEN ROTATOR CUFF REPAIR Right 09/19/2017   Procedure: SHOULDER ARTHROSCOPY WITH OPEN ROTATOR CUFF REPAIR & SUBACROMINAL DECOMPRESSION.;  Surgeon: Thornton Park, MD;  Location: [facility];  Service: Orthopedics;  Laterality: Right;  . SHOULDER ARTHROSCOPY WITH ROTATOR CUFF REPAIR AND SUBACROMIAL DECOMPRESSION Left 02/12/2013   Procedure: LEFT SHOULDER ARTHROSCOPY WITH SUBACROMIAL DECOMPRESSION, DISTAL CLAVICLE RESECTION, POSSIBLE BICEP TENOTOMY, POSSIBLE ROTATOR CUFF REPAIR;  Surgeon: Marin Shutter, MD;  Location: [place];  Service: Orthopedics;  Laterality: Left;  . TONSILLECTOMY  1998    There were no vitals filed for this visit.  Subjective Assessment - 05/16/18 1251    Subjective  Patient states she is doing well at this time. She reports continued improvement since the last session and has not had any pain with AROM of the R shoulder. No specific questions or concerns. She has been performing R shoulder isometrics without any increase in pain.     Pertinent History  Patient has has arthroscopic biceps tenodesis on 03/27/2018 since previously reported pertinent history. Patient was 8 weeks out right shoulder arthroscopy with mini open rotator cuff repair (performed 5/2)  when she moved her waist band feeling like "her arm had been ripped off". Went back to PT and her therapist felt like it had been retorn since she was not able to move arm. Mack Guise said MRI was false positive on tear, wanted pt to go to neurology thinking pain is from neck.  Was thinking it potentially could be complex regional pain syndrome. Patient wanted second opinion so went to [facility] in [place] to [employer]. Was not torn, anchor is lifted, where the repair is was not torn. Prior rotator cuff repair. Severe tendinosis of the supraspinatus tendon with a high-grade partial-thickness articular surface tear with a possible small full-thickness component. Severe tendinosis of the infraspinatus tendon.. Finished up steroid taper form Kransinski which helped with numb and ache.  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 Still not sleeping in bed ,wants to get out of recliner.   Patient has lymphedema on R  Side.     Limitations  Reading;Lifting;Sitting;Walking;House hold activities;Other (comment)    Diagnostic tests  MRI: Prior rotator cuff repair. Severe tendinosis of the    Patient Stated Goals  to reduce pain, return to work duties, improve ROM and strength     Currently in Pain?  No/denies            TREATMENT   Ther-ex PROM of R shoulder for flexion, abduction, ER, and IR with end range holds x multiple bouts in each direction; Rhythmic perturbationmobilization R shoulder while moving through passive scaption to decrease tone and guarding in shoulder; L sidelying R scapula mobilizations superior, inferior, retraction, protraction, and upward rotation for relaxation of musculature; R elbow PROM gentle extension stretch 30s hold x 3; R elbow AROM flexion x 10; R elbow AROM extension with gentle resistance into extension; R shoulder AROM flexion to available pain-free end range x 10; Supine R shoulder AROM protraction at 90 flexion x 10; L sidelying R shoulder AROM abduction, pain-free x 10; L sidelying R shoulder AROM ER, painfree x 10; Prone R shoulder extension to neutral 2 x 10; Prone R shoulder horizontal abduction (T's) 2 x 10 with cues to retract scapula; Prone R shoulder scaption (Y's) 2 x 10 with cues to retract scapula Supine R shoulder isometrics for flexion, extension, abduction, adduction, IR, and ER 5s hold x 5 each; Seated R shoulder AROM flexion with mirror feedback to prevent shoulder hiking and cues to actively retract and depress scapula during motion x 10, encouraged to perform at home;   Pt educated throughout session about proper posture and technique with exercises. Improved exercise technique, movement at target joints, use of target muscles after min to mod verbal, visual, tactile cues.   Pt demonstrates excellent motivation with therapy today. Shecontinues to report improvement in her R shoulder motion as well as decrease in her pain. No pain during session today with all motions of elbow and shoulder. Pt encouraged to work on AROM flexion in standing with mirror feedback to prevent upper trap hiking.Pt will benefit from PT services to address deficits in strength, balance, and mobility in order to return to full function at  home.                       PT Short Term Goals - 05/02/18 1345      PT SHORT TERM GOAL #1   Title  Patient will be independent in home exercise program to improve strength/mobility for better functional independence with ADLs.    Baseline  Patient is currently following HEP per post-op protocol which will need to be updated.    Time  2    Period  Weeks    Status  New    Target Date  05/14/18      PT SHORT TERM GOAL #2   Title  Patient will report a worst pain of 3/10 on VAS in  R shoulder to improve tolerance with ADLs and reduced symptoms with activities.     Baseline  04/29/18: 6/10    Time  2    Period  Weeks    Status  New    Target Date  05/14/18        PT Long Term Goals - 05/02/18 1346      PT LONG TERM GOAL #  1   Title  Patient will report a worst pain of 4/10 on VAS in R shoulder to improve tolerance with ADLs and reduced symptoms with activities.     Baseline  04/29/18: 6/10    Time  4    Period  Weeks    Status  New    Target Date  05/28/18      PT LONG TERM GOAL #2   Title  Patient will improve shoulder AROM to > 160 degrees of flexion, scaption, and abduction for improved ability to perform overhead activities.    Baseline  04/29/2018: supine: flexion: 125 degrees with pain and guarding, abduction: 89 degrees with pain    Time  4    Period  Weeks    Status  New    Target Date  05/28/18      PT LONG TERM GOAL #3   Title  Patient will decrease Quick DASH score by > 8 points demonstrating reduced self-reported upper extremity disability.    Baseline  04/29/2018: 88%    Time  4    Period  Weeks    Status  New    Target Date  05/28/18      PT LONG TERM GOAL #4   Title  Patient will demonstrate 4/5 strength in RUE to allow patient to return to work and perform job duties without compensations.     Baseline  04/29/2018: elbow flexion/extension: 2-/5, Shoulder flexion: not tested due to post op protocol    Time  4    Period  Weeks     Status  New    Target Date  05/28/18      PT LONG TERM GOAL #5   Title  --    Baseline  --    Time  --    Period  --    Status  --            Plan - 05/16/18 1252    Clinical Impression Statement  Pt demonstrates excellent motivation with therapy today. Shecontinues to report improvement in her R shoulder motion as well as decrease in her pain. No pain during session today with all motions of elbow and shoulder. Pt encouraged to work on AROM flexion in standing with mirror feedback to prevent upper trap hiking.Pt will benefit from PT services to address deficits in strength, balance, and mobility in order to return to full function at home.    Rehab Potential  Fair    Clinical Impairments Affecting Rehab Potential  (+) age, educational level (-) secondary injury     PT Frequency  2x / week    PT Duration  4 weeks    PT Treatment/Interventions  ADLs/Self Care Home Management;Aquatic Therapy;Cryotherapy;Electrical Stimulation;Parrafin;Ultrasound;Traction;Moist Heat;Iontophoresis 4mg /ml Dexamethasone;Functional mobility training;Therapeutic activities;Therapeutic exercise;Patient/family education;Neuromuscular re-education;Manual techniques;Manual lymph drainage;Compression bandaging;Taping;Energy conservation;Dry needling;Passive range of motion;Scar mobilization    PT Next Visit Plan  PROM, AAROM, and AROM as appropriate and pain-free    PT Home Exercise Plan  Protocol attached, scapular retractions, AAROM/AROM of R shoulder for flexion/abduction, AAROM for ER, unweighted AROM R elbow flex/ext (no lifting), passive R elbow stretching, AROM for forearm supination, as well as wrist flex/ext, isometrics for R shoudler including flexion, extension, abduction, adduction, IR, and ER.    Consulted and Agree with Plan of Care  Patient       Patient will benefit from skilled therapeutic intervention in order to improve the following deficits and impairments:  Decreased activity tolerance,  Decreased coordination, Decreased endurance,  Decreased mobility, Decreased range of motion, Decreased strength, Decreased scar mobility, Hypomobility, Increased edema, Impaired flexibility, Impaired perceived functional ability, Increased muscle spasms, Impaired UE functional use, Postural dysfunction, Improper body mechanics, Pain  Visit Diagnosis: Acute pain of right shoulder  Muscle weakness (generalized)     Problem List Patient Active Problem List   Diagnosis Date Noted  . Genetic testing 03/21/2018  . Family history of breast cancer 03/06/2018  . Family history of ovarian cancer 03/06/2018  . Family history of esophageal cancer    Phillips Grout PT, DPT, GCS  Angelik Walls 05/16/2018, 12:59 PM  [place] MAIN [facility] SERVICES [address] [place], [place], [address] Phone: [phone]   Fax:  [phone]  Name: Charlene Daniels MRN: [id number] Date of Birth: [birth year]

## 2018-05-19 DIAGNOSIS — S92351K Displaced fracture of fifth metatarsal bone, right foot, subsequent encounter for fracture with nonunion: Secondary | ICD-10-CM | POA: Diagnosis not present

## 2018-05-20 DIAGNOSIS — E1165 Type 2 diabetes mellitus with hyperglycemia: Secondary | ICD-10-CM | POA: Diagnosis not present

## 2018-05-20 DIAGNOSIS — E8940 Asymptomatic postprocedural ovarian failure: Secondary | ICD-10-CM | POA: Diagnosis not present

## 2018-05-20 DIAGNOSIS — Z79890 Hormone replacement therapy (postmenopausal): Secondary | ICD-10-CM | POA: Diagnosis not present

## 2018-05-20 DIAGNOSIS — M8580 Other specified disorders of bone density and structure, unspecified site: Secondary | ICD-10-CM | POA: Diagnosis not present

## 2018-05-22 ENCOUNTER — Ambulatory Visit: Payer: 59 | Attending: Orthopedic Surgery

## 2018-05-22 DIAGNOSIS — M6281 Muscle weakness (generalized): Secondary | ICD-10-CM | POA: Insufficient documentation

## 2018-05-22 DIAGNOSIS — M25511 Pain in right shoulder: Secondary | ICD-10-CM | POA: Insufficient documentation

## 2018-05-22 NOTE — Therapy (Signed)
[place] MAIN [facility] SERVICES [address] [place], [place], [address] Phone: [phone]   Fax:  [phone]  Physical Therapy Treatment  Patient Details  Name: Charlene Daniels MRN: [id number] Date of Birth: 28-Aug-1970 No data recorded  Encounter Date: 05/22/2018  PT End of Session - 05/22/18 1636    Visit Number  6    Number of Visits  20    Date for PT Re-Evaluation  05/29/18    Authorization Type  Progress Note: 6/10, last goals: 04/29/18    PT Start Time  1640    PT Stop Time  1725    PT Time Calculation (min)  45 min    Activity Tolerance  Patient tolerated treatment well    Behavior During Therapy  [facility] for tasks assessed/performed       Past Medical History:  Diagnosis Date  . Arthritis   . Chronic kidney disease    on meds to protect kidney from DM  . Diabetes mellitus without complication ([place])   . Family history of breast cancer 03/06/2018  . Family history of esophageal cancer   . Family history of ovarian cancer 03/06/2018  . GERD (gastroesophageal reflux disease)   . Headache(784.0)   . PONV (postoperative nausea and vomiting)     Past Surgical History:  Procedure Laterality Date  . ABDOMINAL HYSTERECTOMY  2009  . CHOLECYSTECTOMY  1994  . DILATION AND CURETTAGE OF UTERUS  1994  . LYMPH NODE DISSECTION Right 1999   right neck cervical LN dissection after Cat-scratch fever  . SHOULDER ARTHROSCOPY WITH OPEN ROTATOR CUFF REPAIR Right 09/19/2017   Procedure: SHOULDER ARTHROSCOPY WITH OPEN ROTATOR CUFF REPAIR & SUBACROMINAL DECOMPRESSION.;  Surgeon: Thornton Park, MD;  Location: [facility];  Service: Orthopedics;  Laterality: Right;  . SHOULDER ARTHROSCOPY WITH ROTATOR CUFF REPAIR AND SUBACROMIAL DECOMPRESSION Left 02/12/2013   Procedure: LEFT SHOULDER ARTHROSCOPY WITH SUBACROMIAL DECOMPRESSION, DISTAL CLAVICLE RESECTION, POSSIBLE BICEP TENOTOMY, POSSIBLE ROTATOR CUFF REPAIR;  Surgeon: Marin Shutter, MD;  Location: [place];   Service: Orthopedics;  Laterality: Left;  . TONSILLECTOMY  1998    There were no vitals filed for this visit.  Subjective Assessment - 05/22/18 1635    Subjective  Patient states she is doing well at this time. She reports continued improvement since the last session and has not had any pain with AROM of the R shoulder. No specific questions or concerns. She has been performing R shoulder HEP and has no resting pain upon arrival. She has a follow-up with her orthopedic surgeon next week.     Pertinent History  Patient has has arthroscopic biceps tenodesis on 03/27/2018 since previously reported pertinent history. Patient was 8 weeks out right shoulder arthroscopy with mini open rotator cuff repair (performed 5/2)  when she moved her waist band feeling like "her arm had been ripped off". Went back to PT and her therapist felt like it had been retorn since she was not able to move arm. Charlene Daniels said MRI was false positive on tear, wanted pt to go to neurology thinking pain is from neck.  Was thinking it potentially could be complex regional pain syndrome. Patient wanted second opinion so went to [facility] in [place] to [employer]. Was not torn, anchor is lifted, where the repair is was not torn. Prior rotator cuff repair. Severe tendinosis of the supraspinatus tendon with a high-grade partial-thickness articular surface tear with a possible small full-thickness component. Severe tendinosis of the infraspinatus tendon.Charlene Daniels  up steroid taper form Kransinski which helped with numb and ache.   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   Patient has lymphedema on R  Side.     Limitations  Reading;Lifting;Sitting;Walking;House hold activities;Other (comment)    Diagnostic tests  MRI: Prior rotator cuff repair. Severe tendinosis of the    Patient Stated Goals  to reduce pain, return to work duties, improve ROM and strength     Currently in Pain?   No/denies         TREATMENT   Ther-ex PROM of R shoulder for flexion, abduction, ER, and IR with end range holds x multiple bouts in each direction; Rhythmic perturbationmobilization R shoulder while moving through passive scaption to decrease tone and guarding in shoulder; L sidelying R scapula mobilizations superior, inferior, retraction, protraction, and upward rotation for relaxation of musculature; R elbow PROM gentle extension stretch 30s hold x 2; R shoulder AROM flexion to available pain-free end range x 5 L sidelying R shoulder AROM abduction, pain-free x 10; L sidelying R shoulder AROM ER, painfree x 10; Prone R shoulder extension to neutral 2 x 10; Prone R shoulder horizontal abduction (T's) 2 x 10 with cues to retract scapula; Prone R shoulder scaption (Y's) 2 x 10 with cues to retract scapula Standing R shoulder AROM flexion and abduction with mirror feedback to prevent shoulder hiking and cues to actively retract and depress scapula during motion x 10 each, encouraged to perform at home; Finger ladder wall walking for flexion and scaption x 10 each; AROM measurements obtained for R shoulder: Flexion: 168, Abduction: 158, ER: 72, IR: 70   Pt educated throughout session about proper posture and technique with exercises. Improved exercise technique, movement at target joints, use of target muscles after min to mod verbal, visual, tactile cues.   Pt demonstrates excellent motivation with therapy today. Shecontinues to demonstrate improvement in her R shoulder AROM with less pain.No pain during session today with all motions of elbow and shoulder. Pt encouraged to work on AROM flexion and scaption/abduction in standing with mirror feedback to prevent upper trap shoulder elevation. Her AROM readings today were Flexion: 168, Abduction: 158, ER: 72, IR: 70.Pt will benefit from PT services to address deficits in strength, balance, and mobility in order to return to full  function at home.                         PT Short Term Goals - 05/02/18 1345      PT SHORT TERM GOAL #1   Title  Patient will be independent in home exercise program to improve strength/mobility for better functional independence with ADLs.    Baseline  Patient is currently following HEP per post-op protocol which will need to be updated.    Time  2    Period  Weeks    Status  New    Target Date  05/14/18      PT SHORT TERM GOAL #2   Title  Patient will report a worst pain of 3/10 on VAS in  R shoulder to improve tolerance with ADLs and reduced symptoms with activities.     Baseline  04/29/18: 6/10    Time  2    Period  Weeks    Status  New    Target Date  05/14/18        PT Long Term Goals - 05/02/18  [place] #1   Title  Patient will report a worst pain of 4/10 on VAS in R shoulder to improve tolerance with ADLs and reduced symptoms with activities.     Baseline  04/29/18: 6/10    Time  4    Period  Weeks    Status  New    Target Date  05/28/18      PT LONG TERM GOAL #2   Title  Patient will improve shoulder AROM to > 160 degrees of flexion, scaption, and abduction for improved ability to perform overhead activities.    Baseline  04/29/2018: supine: flexion: 125 degrees with pain and guarding, abduction: 89 degrees with pain    Time  4    Period  Weeks    Status  New    Target Date  05/28/18      PT LONG TERM GOAL #3   Title  Patient will decrease Quick DASH score by > 8 points demonstrating reduced self-reported upper extremity disability.    Baseline  04/29/2018: 88%    Time  4    Period  Weeks    Status  New    Target Date  05/28/18      PT LONG TERM GOAL #4   Title  Patient will demonstrate 4/5 strength in RUE to allow patient to return to work and perform job duties without compensations.     Baseline  04/29/2018: elbow flexion/extension: 2-/5, Shoulder flexion: not tested due to post op protocol    Time  4     Period  Weeks    Status  New    Target Date  05/28/18      PT LONG TERM GOAL #5   Title  --    Baseline  --    Time  --    Period  --    Status  --            Plan - 05/22/18 1636    Clinical Impression Statement  Pt demonstrates excellent motivation with therapy today. Shecontinues to demonstrate improvement in her R shoulder AROM with less pain.No pain during session today with all motions of elbow and shoulder. Pt encouraged to work on AROM flexion and scaption/abduction in standing with mirror feedback to prevent upper trap shoulder elevation. Her AROM readings today were Flexion: 168, Abduction: 158, ER: 72, IR: 70.Pt will benefit from PT services to address deficits in strength, balance, and mobility in order to return to full function at home.    Rehab Potential  Fair    Clinical Impairments Affecting Rehab Potential  (+) age, educational level (-) secondary injury     PT Frequency  2x / week    PT Duration  4 weeks    PT Treatment/Interventions  ADLs/Self Care Home Management;Aquatic Therapy;Cryotherapy;Electrical Stimulation;Parrafin;Ultrasound;Traction;Moist Heat;Iontophoresis 4mg /ml Dexamethasone;Functional mobility training;Therapeutic activities;Therapeutic exercise;Patient/family education;Neuromuscular re-education;Manual techniques;Manual lymph drainage;Compression bandaging;Taping;Energy conservation;Dry needling;Passive range of motion;Scar mobilization    PT Next Visit Plan  PROM, AAROM, and AROM as appropriate and pain-free    PT Home Exercise Plan  Protocol attached, scapular retractions, AAROM/AROM of R shoulder for flexion/abduction, AAROM for ER, unweighted AROM R elbow flex/ext (no lifting), passive R elbow stretching, AROM for forearm supination, as well as wrist flex/ext, isometrics for R shoudler including flexion, extension, abduction, adduction, IR, and ER.    Consulted and Agree with Plan of Care  Patient       Patient will benefit from  skilled  therapeutic intervention in order to improve the following deficits and impairments:  Decreased activity tolerance, Decreased coordination, Decreased endurance, Decreased mobility, Decreased range of motion, Decreased strength, Decreased scar mobility, Hypomobility, Increased edema, Impaired flexibility, Impaired perceived functional ability, Increased muscle spasms, Impaired UE functional use, Postural dysfunction, Improper body mechanics, Pain  Visit Diagnosis: Acute pain of right shoulder  Muscle weakness (generalized)     Problem List Patient Active Problem List   Diagnosis Date Noted  . Genetic testing 03/21/2018  . Family history of breast cancer 03/06/2018  . Family history of ovarian cancer 03/06/2018  . Family history of esophageal cancer    Charlene Daniels PT, DPT, GCS  Charlene Daniels,Charlene Daniels 05/23/2018, 11:15 AM  [place] MAIN [facility] SERVICES [address] [place], [place], [address] Phone: [phone]   Fax:  [phone]  Name: Charlene Daniels MRN: [id number] Date of Birth: 09/14/1970

## 2018-05-27 ENCOUNTER — Ambulatory Visit: Payer: 59

## 2018-05-27 DIAGNOSIS — M25511 Pain in right shoulder: Secondary | ICD-10-CM

## 2018-05-27 DIAGNOSIS — M6281 Muscle weakness (generalized): Secondary | ICD-10-CM

## 2018-05-28 ENCOUNTER — Other Ambulatory Visit: Payer: Self-pay | Admitting: Surgery

## 2018-05-28 DIAGNOSIS — M8580 Other specified disorders of bone density and structure, unspecified site: Secondary | ICD-10-CM

## 2018-05-28 NOTE — Therapy (Signed)
[place] MAIN [facility] SERVICES [address] [place], [place], [address] Phone: [phone]   Fax:  [phone]  Physical Therapy Treatment  Patient Details  Name: Charlene Daniels MRN: [id number] Date of Birth: 1970/09/17 No data recorded  Encounter Date: 05/27/2018  PT End of Session - 05/28/18 1027    Visit Number  7    Number of Visits  20    Date for PT Re-Evaluation  05/29/18    Authorization Type  Progress Note: 7/10, last goals: 04/29/18    PT Start Time  1029    PT Stop Time  1115    PT Time Calculation (min)  46 min    Activity Tolerance  Patient tolerated treatment well    Behavior During Therapy  [facility] for tasks assessed/performed       Past Medical History:  Diagnosis Date  . Arthritis   . Chronic kidney disease    on meds to protect kidney from DM  . Diabetes mellitus without complication ([place])   . Family history of breast cancer 03/06/2018  . Family history of esophageal cancer   . Family history of ovarian cancer 03/06/2018  . GERD (gastroesophageal reflux disease)   . Headache(784.0)   . PONV (postoperative nausea and vomiting)     Past Surgical History:  Procedure Laterality Date  . ABDOMINAL HYSTERECTOMY  2009  . CHOLECYSTECTOMY  1994  . DILATION AND CURETTAGE OF UTERUS  1994  . LYMPH NODE DISSECTION Right 1999   right neck cervical LN dissection after Cat-scratch fever  . SHOULDER ARTHROSCOPY WITH OPEN ROTATOR CUFF REPAIR Right 09/19/2017   Procedure: SHOULDER ARTHROSCOPY WITH OPEN ROTATOR CUFF REPAIR & SUBACROMINAL DECOMPRESSION.;  Surgeon: Thornton Park, MD;  Location: [facility];  Service: Orthopedics;  Laterality: Right;  . SHOULDER ARTHROSCOPY WITH ROTATOR CUFF REPAIR AND SUBACROMIAL DECOMPRESSION Left 02/12/2013   Procedure: LEFT SHOULDER ARTHROSCOPY WITH SUBACROMIAL DECOMPRESSION, DISTAL CLAVICLE RESECTION, POSSIBLE BICEP TENOTOMY, POSSIBLE ROTATOR CUFF REPAIR;  Surgeon: Marin Shutter, MD;  Location: [place];   Service: Orthopedics;  Laterality: Left;  . TONSILLECTOMY  1998    There were no vitals filed for this visit.  Subjective Assessment - 05/28/18 1024    Subjective  Patient states she is doing well at this time. She reports continued improvement since the last session and has not had any pain with AROM of the R shoulder. No specific questions or concerns. She has been performing R shoulder HEP and has no resting pain upon arrival. She saw her orthopedic surgical PA who was pleased with her progress. She was maintained on light duty for an additional 8 weeks. She is anxious regarding the response from her direct manager regarding her continued restrictions.     Pertinent History  Patient has has arthroscopic biceps tenodesis on 03/27/2018 since previously reported pertinent history. Patient was 8 weeks out right shoulder arthroscopy with mini open rotator cuff repair (performed 5/2)  when she moved her waist band feeling like "her arm had been ripped off". Went back to PT and her therapist felt like it had been retorn since she was not able to move arm. Mack Guise said MRI was false positive on tear, wanted pt to go to neurology thinking pain is from neck.  Was thinking it potentially could be complex regional pain syndrome. Patient wanted second opinion so went to [facility] in [place] to [employer]. Was not torn, anchor is lifted, where the repair is was not torn. Prior rotator cuff  repair. Severe tendinosis of the supraspinatus tendon with a high-grade partial-thickness articular surface tear with a possible small full-thickness component. Severe tendinosis of the infraspinatus tendon.. Finished up steroid taper form [place] which helped with numb and ache.   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 Still not sleeping in bed ,wants to get out of recliner.   Patient has lymphedema on R  Side.     Limitations  Reading;Lifting;Sitting;Walking;House hold activities;Other (comment)    Diagnostic tests   MRI: Prior rotator cuff repair. Severe tendinosis of the    Patient Stated Goals  to reduce pain, return to work duties, improve ROM and strength     Currently in Pain?  No/denies          TREATMENT   Manual Therapy  PROM of R shoulder for flexion, abduction, ER, and IR with end range holds x multiple bouts in each direction; Rhythmic perturbationmobilization R shoulder while moving through passive scaption to decrease tone and guarding in shoulder; Gentle grade I-II A/P mobilizations at neutral 30s/bout x 2 bouts; Gentle grade I-II posterior/inferior mobilizations at available end range flexion 30s/bout x 2 bouts;   Ther-ex R elbow PROM gentle extension stretch 30s hold x 2; R elbow AROM with resisted elbow extension x 10; R shoulder AROM flexion to available pain-free end range x 10; L sidelying R shoulder AROM abduction, pain-free x 10; L sidelying R shoulder AROM ER, painfree x 10; R shoulder isometric flexion, abduction, adduction, ER, IR, and extension with 5s hold x 10 each; UE ranger R shoulder flexion, scaption, and CW/CCW circles x 15 each direction bilteral;   Pt educated throughout session about proper posture and technique with exercises. Improved exercise technique, movement at target joints, use of target muscles after min to mod verbal, visual, tactile cues.   Pt demonstrates excellent motivation with therapy today. Shecontinues to demonstrate improvement in her R shoulder AROM with less pain.She states that she is receiving pressure from her manager to return to full duty after her surgeon wrote her to continue light duty for another 8 weeks. She is concerned that it might affect her employment. 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 Therapist advised pt that she must follow the work restrictions provided by her surgeon which according to the patient are  no pulling, pushing, or lifting greater than 5 pounds with her RUE. 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 Pt encouraged to follow up with the HR department and her surgeon as needed for clarity. Pt encouraged to continue HEP. Will progress her to resisted strengthening once her protocol allows.Pt will benefit from PT services to address deficits in strength, balance, and mobility in order to return to full function at home.                        PT Short Term Goals - 05/02/18 1345      PT SHORT TERM GOAL #1   Title  Patient will be  independent in home exercise program to improve strength/mobility for better functional independence with ADLs.    Baseline  Patient is currently following HEP per post-op protocol which will need to be updated.    Time  2    Period  Weeks    Status  New    Target Date  05/14/18      PT SHORT TERM GOAL #2   Title  Patient will report a worst pain of 3/10 on VAS in  R shoulder to improve tolerance with ADLs and reduced symptoms with activities.     Baseline  04/29/18: 6/10    Time  2    Period  Weeks    Status  New    Target Date  05/14/18        PT Long Term Goals - 05/02/18 1346      PT LONG TERM GOAL #1   Title  Patient will report a worst pain of 4/10 on VAS in R shoulder to improve tolerance with ADLs and reduced symptoms with activities.     Baseline  04/29/18: 6/10    Time  4    Period  Weeks    Status  New    Target Date  05/28/18      PT LONG TERM GOAL #2   Title  Patient will improve shoulder AROM to >  160 degrees of flexion, scaption, and abduction for improved ability to perform overhead activities.    Baseline  04/29/2018: supine: flexion: 125 degrees with pain and guarding, abduction: 89 degrees with pain    Time  4    Period  Weeks    Status  New    Target Date  05/28/18      PT LONG TERM GOAL #3   Title  Patient will decrease Quick DASH score by > 8 points demonstrating reduced self-reported upper extremity disability.    Baseline  04/29/2018: 88%    Time  4    Period  Weeks    Status  New    Target Date  05/28/18      PT LONG TERM GOAL #4   Title  Patient will demonstrate 4/5 strength in RUE to allow patient to return to work and perform job duties without compensations.     Baseline  04/29/2018: elbow flexion/extension: 2-/5, Shoulder flexion: not tested due to post op protocol    Time  4    Period  Weeks    Status  New    Target Date  05/28/18      PT LONG TERM GOAL #5   Title  --    Baseline  --    Time  --    Period  --    Status  --            Plan - 05/28/18 1027    Clinical Impression Statement  Pt demonstrates excellent motivation with therapy today. Shecontinues to demonstrate improvement in her R shoulder AROM with less pain.She states that she is receiving pressure from her manager to return to full duty after her surgeon wrote her to continue light duty for another 8 weeks. She is concerned that it might affect her employment. 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 Therapist advised pt that she must follow the work restrictions provided by her surgeon which according to the patient are no pulling, pushing, or lifting  greater than 5 pounds with her RUE. 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 Pt encouraged to follow up with the HR department and her surgeon as needed for clarity. Pt encouraged to continue HEP. Will progress her to resisted strengthening once her protocol allows.Pt will benefit from PT services to address deficits in strength, balance, and mobility in order to return to full function at home.    Rehab Potential  Fair    Clinical Impairments Affecting Rehab Potential  (+) age, educational level (-) secondary injury     PT Frequency  2x / week    PT Duration  4 weeks    PT Treatment/Interventions  ADLs/Self Care Home Management;Aquatic Therapy;Cryotherapy;Electrical Stimulation;Parrafin;Ultrasound;Traction;Moist Heat;Iontophoresis 4mg /ml Dexamethasone;Functional mobility training;Therapeutic activities;Therapeutic exercise;Patient/family education;Neuromuscular re-education;Manual techniques;Manual lymph drainage;Compression bandaging;Taping;Energy conservation;Dry needling;Passive range of motion;Scar mobilization    PT Next Visit Plan  PROM, AAROM, and AROM as appropriate and pain-free    PT Home Exercise Plan  Protocol attached, scapular retractions, AAROM/AROM of R shoulder for flexion/abduction, AAROM for ER, unweighted AROM R elbow flex/ext (no lifting), passive R elbow stretching, AROM for forearm supination, as well as wrist flex/ext, isometrics for R shoudler including flexion, extension, abduction, adduction, IR, and ER.    Consulted and Agree with Plan of Care  Patient       Patient will benefit from skilled therapeutic intervention in order to improve the following deficits and impairments:  Decreased activity tolerance, Decreased coordination, Decreased endurance, Decreased mobility, Decreased range of  motion, Decreased strength, Decreased scar mobility, Hypomobility, Increased edema, Impaired flexibility, Impaired perceived functional ability, Increased muscle spasms, Impaired UE functional use, Postural dysfunction, Improper body mechanics, Pain  Visit Diagnosis: Acute pain of right shoulder  Muscle weakness (generalized)     Problem List Patient Active Problem List   Diagnosis Date Noted  . Genetic testing 03/21/2018  . Family history of breast cancer 03/06/2018  . Family history of ovarian cancer 03/06/2018  . Family history of esophageal cancer    Phillips Grout PT, DPT, GCS  Tomislav Micale 05/28/2018, 2:46 PM  [place] MAIN [facility] SERVICES [address] [place], [place], [address] Phone: [phone]   Fax:  [phone]  Name: PLEASANT BRITZ MRN: [id number] Date of Birth: January 07, 1971

## 2018-05-29 ENCOUNTER — Ambulatory Visit: Payer: 59

## 2018-05-29 DIAGNOSIS — M6281 Muscle weakness (generalized): Secondary | ICD-10-CM

## 2018-05-29 DIAGNOSIS — M25511 Pain in right shoulder: Secondary | ICD-10-CM

## 2018-05-29 NOTE — Therapy (Signed)
[place] MAIN [facility] SERVICES [address] [place], [place], [address] Phone: [phone]   Fax:  [phone]  Physical Therapy Treatment/Recertification  Patient Details  Name: Charlene Daniels MRN: [id number] Date of Birth: 1970-10-26 No data recorded  Encounter Date: 05/29/2018  PT End of Session - 06/01/18 2058    Visit Number  8    Number of Visits  20    Date for PT Re-Evaluation  07/24/18    Authorization Type  Progress Note: 8/10, last goals: 04/29/18    PT Start Time  1647    PT Stop Time  1730    PT Time Calculation (min)  43 min    Activity Tolerance  Patient tolerated treatment well    Behavior During Therapy  [facility] for tasks assessed/performed       Past Medical History:  Diagnosis Date  . Arthritis   . Chronic kidney disease    on meds to protect kidney from DM  . Diabetes mellitus without complication ([place])   . Family history of breast cancer 03/06/2018  . Family history of esophageal cancer   . Family history of ovarian cancer 03/06/2018  . GERD (gastroesophageal reflux disease)   . Headache(784.0)   . PONV (postoperative nausea and vomiting)     Past Surgical History:  Procedure Laterality Date  . ABDOMINAL HYSTERECTOMY  2009  . CHOLECYSTECTOMY  1994  . DILATION AND CURETTAGE OF UTERUS  1994  . LYMPH NODE DISSECTION Right 1999   right neck cervical LN dissection after Cat-scratch fever  . SHOULDER ARTHROSCOPY WITH OPEN ROTATOR CUFF REPAIR Right 09/19/2017   Procedure: SHOULDER ARTHROSCOPY WITH OPEN ROTATOR CUFF REPAIR & SUBACROMINAL DECOMPRESSION.;  Surgeon: Thornton Park, MD;  Location: [facility] ORS;  Service: Orthopedics;  Laterality: Right;  . SHOULDER ARTHROSCOPY WITH ROTATOR CUFF REPAIR AND SUBACROMIAL DECOMPRESSION Left 02/12/2013   Procedure: LEFT SHOULDER ARTHROSCOPY WITH SUBACROMIAL DECOMPRESSION, DISTAL CLAVICLE RESECTION, POSSIBLE BICEP TENOTOMY, POSSIBLE ROTATOR CUFF REPAIR;  Surgeon: Marin Shutter, MD;   Location: [facility];  Service: Orthopedics;  Laterality: Left;  . TONSILLECTOMY  1998    There were no vitals filed for this visit.  Subjective Assessment - 06/01/18 2058    Subjective  Patient states she is doing well at this time. She is complaining of increased R shoulder soreness upon arrival. "I think I slept on it wrong."     Pertinent History  Patient has has arthroscopic biceps tenodesis on 03/27/2018 since previously reported pertinent history. Patient was 8 weeks out right shoulder arthroscopy with mini open rotator cuff repair (performed 5/2)  when she moved her waist band feeling like "her arm had been ripped off". Went back to PT and her therapist felt like it had been retorn since she was not able to move arm. Mack Guise said MRI was false positive on tear, wanted pt to go to neurology thinking pain is from neck.  Was thinking it potentially could be complex regional pain syndrome. Patient wanted second opinion so went to [facility] in [place] to [employer]. Was not torn, anchor is lifted, where the repair is was not torn. Prior rotator cuff repair. Severe tendinosis of the supraspinatus tendon with a high-grade partial-thickness articular surface tear with a possible small full-thickness component. Severe tendinosis of the infraspinatus tendon.. Finished up steroid taper form Kransinski which helped with numb and ache.   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 Still not sleeping in bed ,wants to get out of recliner.  Patient has lymphedema on R  Side.     Limitations  Reading;Lifting;Sitting;Walking;House hold activities;Other (comment)    Diagnostic tests  MRI: Prior rotator cuff repair. Severe tendinosis of the    Patient Stated Goals  to reduce pain, return to work duties, improve ROM and strength     Currently in Pain?  Yes    Pain Score  5     Pain Location  Arm    Pain Orientation  Right    Pain Descriptors / Indicators  Sore    Pain Type  Acute pain    Pain Onset  1 to 4 weeks  ago    Pain Frequency  Intermittent          TREATMENT   Manual Therapy  PROM of R shoulder for flexion, abduction, ER, and IR with end range holds x multiple bouts in each direction; Rhythmic perturbationmobilization R shoulder while moving through passive scaption to decrease tone and guarding in shoulder; L sideyling R scapula mobilizations for elevation, depression, retraction, protraction, and upward rotation; Gentle grade I-II A/P mobilizations at neutral 30s/bout x 2 bouts; Gentle grade I-II posterior/inferior mobilizations at available end range flexion 30s/bout x 2 bouts;   Ther-ex UE ranger R shoulder flexion, scaption, and CW/CCW circles x 15 each direction bilteral; R elbow PROM gentle extension stretch 30s hold x2; R elbow AROM with resisted elbow extension x 10; R shoulder AROM flexion to available pain-free end range x10; L sidelying R shoulder AROM abduction, pain-free x 10; L sidelying R shoulder AROM ER, painfree x 10; Prone R shoulder extension to neutral, painfree 2 x 10; Prone R shoulder horizontal abduction with cues to retract scapulat, painfree 2 x 10; Prone R shoulder "Y's" with cues for scapular retraction, painfree, 2 x 10; R shoulder submaximal isometric flexion, abduction, adduction, ER, IR, and extension with 5s hold x 10 each; Standing R shoulder flexion and scaption with mirror feedback as well as verbal/tactile cues from therapist to prevent R shoulder hiking x 10 each with upward scapular assist rotation provided intermittently during scaption;   Pt educated throughout session about proper posture and technique with exercises. Improved exercise technique, movement at target joints, use of target muscles after min to mod verbal, visual, tactile cues.   Pt demonstrates excellent motivation with therapy today. Shecontinues todemonstrateimprovement in her R shoulder AROM with less pain however is having some soreness today stating that  she believes that she slept on her shoulder in an uncomfortable position. For the most part she is not having any resting pain and only minimal pain her available end range with AROM. She is making great progress toward her goals and the last AROM readings for her right shoulder were flexion: 168, abduction: 158, ER: 72, IR: 70. She recently has been receiving pressure from her manager to return to full duty which has been stressful but she has been receiving support from the team that reviews medical accomodations. She is ready to progress to mild theraband resistance strenthening for her shoulder if she remains with minimal pain over the next couple visits. Pt encouraged to continue HEP.Pt will continue to benefit from PT services to address deficits in strength and range of motion in order to return to full function at home and with her work related responsibilities.                     PT Short Term Goals - 06/01/18 2109      PT SHORT  TERM GOAL #1   Title  Patient will be independent in home exercise program to improve strength/mobility for better functional independence with ADLs.    Baseline  Patient is currently following HEP per post-op protocol which will need to be updated.    Time  4    Period  Weeks    Status  On-going    Target Date  06/26/18      PT SHORT TERM GOAL #2   Title  Patient will report a worst pain of 3/10 on VAS in  R shoulder to improve tolerance with ADLs and reduced symptoms with activities.     Baseline  04/29/18: 6/10, 05/29/18: Reports 5/10 upon arrival today but mostly minimal pain    Time  4    Period  Weeks    Status  Partially Met    Target Date  06/26/18        PT Long Term Goals - 06/01/18 2110      PT LONG TERM GOAL #1   Title  Patient will report a worst pain of 4/10 on VAS in R shoulder to improve tolerance with ADLs and reduced symptoms with activities.     Baseline  04/29/18: 6/10; 05/29/18: Reports 5/10 upon arrival today but  mostly minimal pain    Time  8    Period  Weeks    Status  Partially Met    Target Date  07/24/18      PT LONG TERM GOAL #2   Title  Patient will improve shoulder AROM to > 160 degrees of flexion, scaption, and abduction for improved ability to perform overhead activities.    Baseline  04/29/2018: supine: flexion: 125 degrees with pain and guarding, abduction: 89 degrees with pain; 05/22/18: flexion: 168, Abduction: 158, ER: 72, IR: 70    Time  8    Period  Weeks    Status  Partially Met    Target Date  07/24/18      PT LONG TERM GOAL #3   Title  Patient will decrease Quick DASH score by > 8 points demonstrating reduced self-reported upper extremity disability.    Baseline  04/29/2018: 88%    Time  8    Period  Weeks    Status  Deferred    Target Date  07/24/18      PT LONG TERM GOAL #4   Title  Patient will demonstrate 4/5 strength in RUE to allow patient to return to work and perform job duties without compensations.     Baseline  04/29/2018: elbow flexion/extension: 2-/5, Shoulder flexion: not tested due to post op protocol; 05/29/18: Not tested due to post-op protocol    Time  8    Period  Weeks    Status  Deferred    Target Date  07/24/18            Plan - 06/01/18 2104    Clinical Impression Statement  Pt demonstrates excellent motivation with therapy today. Shecontinues todemonstrateimprovement in her R shoulder AROM with less pain however is having some soreness today stating that she believes that she slept on her shoulder in an uncomfortable position. For the most part she is not having any resting pain and only minimal pain her available end range with AROM. She is making great progress toward her goals and the last AROM readings for her right shoulder were flexion: 168, abduction: 158, ER: 72, IR: 70. She recently has been receiving pressure from her manager to return to  full duty which has been stressful but she has been receiving support from the team that reviews  medical accomodations. She is ready to progress to mild theraband resistance strenthening for her shoulder if she remains with minimal pain over the next couple visits. Pt encouraged to continue HEP.Pt will continue to benefit from PT services to address deficits in strength and range of motion in order to return to full function at home and with her work related responsibilities.    Rehab Potential  Fair    Clinical Impairments Affecting Rehab Potential  (+) age, educational level (-) secondary injury     PT Frequency  2x / week    PT Duration  8 weeks    PT Treatment/Interventions  ADLs/Self Care Home Management;Aquatic Therapy;Cryotherapy;Electrical Stimulation;Parrafin;Ultrasound;Traction;Moist Heat;Iontophoresis 51m/ml Dexamethasone;Functional mobility training;Therapeutic activities;Therapeutic exercise;Patient/family education;Neuromuscular re-education;Manual techniques;Manual lymph drainage;Compression bandaging;Taping;Energy conservation;Dry needling;Passive range of motion;Scar mobilization    PT Next Visit Plan  PROM, AAROM, and AROM as appropriate and pain-free    PT Home Exercise Plan  Protocol attached, scapular retractions, AAROM/AROM of R shoulder for flexion/abduction, AAROM for ER, unweighted AROM R elbow flex/ext (no lifting), passive R elbow stretching, AROM for forearm supination, as well as wrist flex/ext, isometrics for R shoudler including flexion, extension, abduction, adduction, IR, and ER.    Consulted and Agree with Plan of Care  Patient       Patient will benefit from skilled therapeutic intervention in order to improve the following deficits and impairments:  Decreased activity tolerance, Decreased coordination, Decreased endurance, Decreased mobility, Decreased range of motion, Decreased strength, Decreased scar mobility, Hypomobility, Increased edema, Impaired flexibility, Impaired perceived functional ability, Increased muscle spasms, Impaired UE functional use,  Postural dysfunction, Improper body mechanics, Pain  Visit Diagnosis: Acute pain of right shoulder  Muscle weakness (generalized)     Problem List Patient Active Problem List   Diagnosis Date Noted  . Genetic testing 03/21/2018  . Family history of breast cancer 03/06/2018  . Family history of ovarian cancer 03/06/2018  . Family history of esophageal cancer    JPhillips GroutPT, DPT, GCS  Valton Schwartz 06/01/2018, 9:23 PM  [place] [facility] CenterSERV[facility] [address] [address]Phone: [phone]  Fax:  [phone] Name: Charlene DEVIVOMRN: [id number] of Birth: 108-24-1972

## 2018-06-01 NOTE — Addendum Note (Signed)
Addended by: Roxana Hires D on: 06/01/2018 09:28 PM   Modules accepted: Orders

## 2018-06-03 ENCOUNTER — Ambulatory Visit: Payer: 59

## 2018-06-05 ENCOUNTER — Ambulatory Visit: Payer: 59

## 2018-06-05 DIAGNOSIS — M6281 Muscle weakness (generalized): Secondary | ICD-10-CM

## 2018-06-05 DIAGNOSIS — M25511 Pain in right shoulder: Secondary | ICD-10-CM

## 2018-06-05 NOTE — Therapy (Signed)
[place] MAIN [facility] SERVICES [address] [place], [place], [address] Phone: [phone]   Fax:  [phone]  Physical Therapy Treatment  Patient Details  Name: Charlene Daniels MRN: [id number] Date of Birth: [birth year] No data recorded  Encounter Date: 06/05/2018  PT End of Session - 06/06/18 1310    Visit Number  9    Number of Visits  20    Date for PT Re-Evaluation  07/24/18    Authorization Type  last goals: 04/29/18    PT Start Time  1647    PT Stop Time  1732    PT Time Calculation (min)  45 min    Activity Tolerance  Patient tolerated treatment well    Behavior During Therapy  [facility] for tasks assessed/performed       Past Medical History:  Diagnosis Date  . Arthritis   . Chronic kidney disease    on meds to protect kidney from DM  . Diabetes mellitus without complication ([place])   . Family history of breast cancer 03/06/2018  . Family history of esophageal cancer   . Family history of ovarian cancer 03/06/2018  . GERD (gastroesophageal reflux disease)   . Headache(784.0)   . PONV (postoperative nausea and vomiting)     Past Surgical History:  Procedure Laterality Date  . ABDOMINAL HYSTERECTOMY  2009  . CHOLECYSTECTOMY  1994  . DILATION AND CURETTAGE OF UTERUS  1994  . LYMPH NODE DISSECTION Right 1999   right neck cervical LN dissection after Cat-scratch fever  . SHOULDER ARTHROSCOPY WITH OPEN ROTATOR CUFF REPAIR Right 09/19/2017   Procedure: SHOULDER ARTHROSCOPY WITH OPEN ROTATOR CUFF REPAIR & SUBACROMINAL DECOMPRESSION.;  Surgeon: Thornton Park, MD;  Location: [facility];  Service: Orthopedics;  Laterality: Right;  . SHOULDER ARTHROSCOPY WITH ROTATOR CUFF REPAIR AND SUBACROMIAL DECOMPRESSION Left 02/12/2013   Procedure: LEFT SHOULDER ARTHROSCOPY WITH SUBACROMIAL DECOMPRESSION, DISTAL CLAVICLE RESECTION, POSSIBLE BICEP TENOTOMY, POSSIBLE ROTATOR CUFF REPAIR;  Surgeon: Marin Shutter, MD;  Location: [place];  Service:  Orthopedics;  Laterality: Left;  . TONSILLECTOMY  1998    There were no vitals filed for this visit.  Subjective Assessment - 06/06/18 1309    Subjective  Patient states she is doing well at this time. She reports that the R shoulder soreness she was experiencing at the last visit has since resolved. She is not complaining of any pain at this time.     Pertinent History  Patient has has arthroscopic biceps tenodesis on 03/27/2018 since previously reported pertinent history. Patient was 8 weeks out right shoulder arthroscopy with mini open rotator cuff repair (performed 5/2)  when she moved her waist band feeling like "her arm had been ripped off". Went back to PT and her therapist felt like it had been retorn since she was not able to move arm. Mack Guise said MRI was false positive on tear, wanted pt to go to neurology thinking pain is from neck.  Was thinking it potentially could be complex regional pain syndrome. Patient wanted second opinion so went to [facility] in [place] to [employer]. Was not torn, anchor is lifted, where the repair is was not torn. Prior rotator cuff repair. Severe tendinosis of the supraspinatus tendon with a high-grade partial-thickness articular surface tear with a possible small full-thickness component. Severe tendinosis of the infraspinatus tendon.. Finished up steroid taper form Kransinski which helped with numb and ache.   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   Patient has lymphedema on R  Side.     Limitations  Reading;Lifting;Sitting;Walking;House hold activities;Other (comment)    Diagnostic tests  MRI: Prior rotator cuff repair. Severe tendinosis of the    Patient Stated Goals  to reduce pain, return to work duties, improve ROM and strength     Currently in Pain?  No/denies    Pain Onset  --          TREATMENT   Manual Therapy PROM of R shoulder for flexion, abduction, ER, and IR with end  range holds x multiple bouts in each direction; Rhythmic perturbationmobilization R shoulder while moving through passive scaption to decrease tone and guarding in shoulder; Gentle grade I-II A/P mobilizations at neutral 30s/bout x 2 bouts; Gentle grade I-II posterior/inferior mobilizations at available end range flexion 30s/bout x 2 bouts;   Ther-ex UE ranger R shoulder flexion, scaption, and CW/CCW circles x 15 each direction bilteral; R elbow PROM gentle extension stretch 30s hold x2; R elbow AROM x 10; R shoulder AROM flexion to available pain-free end range with gentle manual resistance above the elbow 2 x10; L sidelying R shoulder AROM abduction, pain-free with gentle manual resistance above the elbow 2 x 10; L sidelying R shoulder AROM ER, painfree with gentle manual resistance 2 x 10; Prone R shoulder extension to neutral, painfree 2 x 10; Prone R shoulder horizontal abduction with cues to retract scapulat, painfree 2 x 10; Prone R shoulder "Y's" with cues for scapular retraction, painfree, 2 x 10; Standing R shoulder extension to neutral with straight elbow and red tband 2 x 10; Standing R shoulder resisted ER with red tband and care taken to avoid any activation of elbow flexors 2 x 10; Standing R shoulder flexion and scaption with mirror feedback as well as verbal/tactile cues from therapist to prevent R shoulder hiking x 10 each with upward scapular assist rotation provided intermittently during scaption; Supine R shoulder IR with red tband, pain-free with care to avoid activation of elbow flexors 2 x 10; Supine R shoulder serratus punch with gentle manual resistance 2 x 10;   Pt educated throughout session about proper posture and technique with exercises. Improved exercise technique, movement at target joints, use of target muscles after min to mod verbal, visual, tactile cues.   Pt demonstrates excellent motivation with therapy today. Shecontinues  todemonstrateimprovement in her R shoulder AROM with less pain. For the most part she is not having any resting pain and only minimal pain her available end range with AROM. She is able to progress to some gentle RTC strengthening with theraband today taking care to avoid any resisted bicep contractions. She will need updated outcome measures, and goals, and progress note at next visit. Pt encouraged to continue HEP.Pt will continue to benefit from PT services to address deficits in strength and range of motion in order to return to full function at home and with her work related responsibilities.                          PT Short Term Goals - 06/01/18 2109      PT SHORT TERM GOAL #1   Title  Patient will be independent in home exercise program to improve strength/mobility for better functional independence with ADLs.    Baseline  Patient is currently following HEP per post-op protocol which will need to be updated.    Time  4  Period  Weeks    Status  On-going    Target Date  06/26/18      PT SHORT TERM GOAL #2   Title  Patient will report a worst pain of 3/10 on VAS in  R shoulder to improve tolerance with ADLs and reduced symptoms with activities.     Baseline  04/29/18: 6/10, 05/29/18: Reports 5/10 upon arrival today but mostly minimal pain    Time  4    Period  Weeks    Status  Partially Met    Target Date  06/26/18        PT Long Term Goals - 06/01/18 2110      PT LONG TERM GOAL #1   Title  Patient will report a worst pain of 4/10 on VAS in R shoulder to improve tolerance with ADLs and reduced symptoms with activities.     Baseline  04/29/18: 6/10; 05/29/18: Reports 5/10 upon arrival today but mostly minimal pain    Time  8    Period  Weeks    Status  Partially Met    Target Date  07/24/18      PT LONG TERM GOAL #2   Title  Patient will improve shoulder AROM to > 160 degrees of flexion, scaption, and abduction for improved ability to perform overhead  activities.    Baseline  04/29/2018: supine: flexion: 125 degrees with pain and guarding, abduction: 89 degrees with pain; 05/22/18: flexion: 168, Abduction: 158, ER: 72, IR: 70    Time  8    Period  Weeks    Status  Partially Met    Target Date  07/24/18      PT LONG TERM GOAL #3   Title  Patient will decrease Quick DASH score by > 8 points demonstrating reduced self-reported upper extremity disability.    Baseline  04/29/2018: 88%    Time  8    Period  Weeks    Status  Deferred    Target Date  07/24/18      PT LONG TERM GOAL #4   Title  Patient will demonstrate 4/5 strength in RUE to allow patient to return to work and perform job duties without compensations.     Baseline  04/29/2018: elbow flexion/extension: 2-/5, Shoulder flexion: not tested due to post op protocol; 05/29/18: Not tested due to post-op protocol    Time  8    Period  Weeks    Status  Deferred    Target Date  07/24/18            Plan - 06/06/18 1310    Clinical Impression Statement  Pt demonstrates excellent motivation with therapy today. Shecontinues todemonstrateimprovement in her R shoulder AROM with less pain. For the most part she is not having any resting pain and only minimal pain her available end range with AROM. She is able to progress to some gentle RTC strengthening with theraband today taking care to avoid any resisted bicep contractions. She will need updated outcome measures, and goals, and progress note at next visit. Pt encouraged to continue HEP.Pt will continue to benefit from PT services to address deficits in strength and range of motion in order to return to full function at home and with her work related responsibilities.    Rehab Potential  Fair    Clinical Impairments Affecting Rehab Potential  (+) age, educational level (-) secondary injury     PT Frequency  2x / week    PT Duration  8 weeks    PT Treatment/Interventions  ADLs/Self Care Home Management;Aquatic  Therapy;Cryotherapy;Electrical Stimulation;Parrafin;Ultrasound;Traction;Moist Heat;Iontophoresis 22m/ml Dexamethasone;Functional mobility training;Therapeutic activities;Therapeutic exercise;Patient/family education;Neuromuscular re-education;Manual techniques;Manual lymph drainage;Compression bandaging;Taping;Energy conservation;Dry needling;Passive range of motion;Scar mobilization    PT Next Visit Plan  PROM, AAROM, and AROM as appropriate and pain-free    PT Home Exercise Plan  Protocol attached, scapular retractions, AAROM/AROM of R shoulder for flexion/abduction, AAROM for ER, unweighted AROM R elbow flex/ext (no lifting), passive R elbow stretching, AROM for forearm supination, as well as wrist flex/ext, isometrics for R shoudler including flexion, extension, abduction, adduction, IR, and ER.    Consulted and Agree with Plan of Care  Patient       Patient will benefit from skilled therapeutic intervention in order to improve the following deficits and impairments:  Decreased activity tolerance, Decreased coordination, Decreased endurance, Decreased mobility, Decreased range of motion, Decreased strength, Decreased scar mobility, Hypomobility, Increased edema, Impaired flexibility, Impaired perceived functional ability, Increased muscle spasms, Impaired UE functional use, Postural dysfunction, Improper body mechanics, Pain  Visit Diagnosis: Acute pain of right shoulder  Muscle weakness (generalized)     Problem List Patient Active Problem List   Diagnosis Date Noted  . Genetic testing 03/21/2018  . Family history of breast cancer 03/06/2018  . Family history of ovarian cancer 03/06/2018  . Family history of esophageal cancer    JPhillips GroutPT, DPT, GCS  , 06/06/2018, 1:13 PM  [place]MAIN [facility]Alamar[facility] LL[facility]ERVICES [address] [address]Phone: [phone]  Fax:  [phone] Name: Charlene SALMELAMRN:  [id number] of Birth: [birth year]

## 2018-06-10 ENCOUNTER — Ambulatory Visit: Payer: 59

## 2018-06-10 DIAGNOSIS — M25511 Pain in right shoulder: Secondary | ICD-10-CM

## 2018-06-10 DIAGNOSIS — M6281 Muscle weakness (generalized): Secondary | ICD-10-CM | POA: Diagnosis not present

## 2018-06-10 NOTE — Therapy (Signed)
[place] MAIN [facility] SERVICES [address] [place], [place], [address] Phone: [phone]   Fax:  [phone]  Physical Therapy Treatment & Progress Note  Patient Details  Name: Charlene Daniels MRN: [id number] Date of Birth: 1970/10/10 No data recorded  Encounter Date: 06/10/2018  PT End of Session - 06/10/18 1652    Visit Number  10    Number of Visits  20    Date for PT Re-Evaluation  07/24/18    Authorization Type  last goals: 04/29/18    PT Start Time  1645    PT Stop Time  1732    PT Time Calculation (min)  47 min    Activity Tolerance  Patient tolerated treatment well    Behavior During Therapy  [facility] for tasks assessed/performed       Past Medical History:  Diagnosis Date  . Arthritis   . Chronic kidney disease    on meds to protect kidney from DM  . Diabetes mellitus without complication ([place])   . Family history of breast cancer 03/06/2018  . Family history of esophageal cancer   . Family history of ovarian cancer 03/06/2018  . GERD (gastroesophageal reflux disease)   . Headache(784.0)   . PONV (postoperative nausea and vomiting)     Past Surgical History:  Procedure Laterality Date  . ABDOMINAL HYSTERECTOMY  2009  . CHOLECYSTECTOMY  1994  . DILATION AND CURETTAGE OF UTERUS  1994  . LYMPH NODE DISSECTION Right 1999   right neck cervical LN dissection after Cat-scratch fever  . SHOULDER ARTHROSCOPY WITH OPEN ROTATOR CUFF REPAIR Right 09/19/2017   Procedure: SHOULDER ARTHROSCOPY WITH OPEN ROTATOR CUFF REPAIR & SUBACROMINAL DECOMPRESSION.;  Surgeon: Thornton Park, MD;  Location: [facility];  Service: Orthopedics;  Laterality: Right;  . SHOULDER ARTHROSCOPY WITH ROTATOR CUFF REPAIR AND SUBACROMIAL DECOMPRESSION Left 02/12/2013   Procedure: LEFT SHOULDER ARTHROSCOPY WITH SUBACROMIAL DECOMPRESSION, DISTAL CLAVICLE RESECTION, POSSIBLE BICEP TENOTOMY, POSSIBLE ROTATOR CUFF REPAIR;  Surgeon: Marin Shutter, MD;  Location: [place];   Service: Orthopedics;  Laterality: Left;  . TONSILLECTOMY  1998    There were no vitals filed for this visit.  Subjective Assessment - 06/10/18 1651    Subjective  Patient reported that she feels she has improved 60%. Feels like she still needs to do some strengthening. Pt reported some increased soreness after her last session, tooke some medication and woke up the next morning feeling back to normal.     Pertinent History  Patient has has arthroscopic biceps tenodesis on 03/27/2018 since previously reported pertinent history. Patient was 8 weeks out right shoulder arthroscopy with mini open rotator cuff repair (performed 5/2)  when she moved her waist band feeling like "her arm had been ripped off". Went back to PT and her therapist felt like it had been retorn since she was not able to move arm. Mack Guise said MRI was false positive on tear, wanted pt to go to neurology thinking pain is from neck.  Was thinking it potentially could be complex regional pain syndrome. Patient wanted second opinion so went to [facility] in [place] to [employer]. Was not torn, anchor is lifted, where the repair is was not torn. Prior rotator cuff repair. Severe tendinosis of the supraspinatus tendon with a high-grade partial-thickness articular surface tear with a possible small full-thickness component. Severe tendinosis of the infraspinatus tendon.. Finished up steroid taper form Kransinski which helped with numb and ache.   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 Still not sleeping in bed ,wants to get out of recliner.   Patient has lymphedema on R  Side.     Limitations  Reading;Lifting;Sitting;Walking;House hold activities;Other (comment)    Diagnostic tests  MRI: Prior rotator cuff repair. Severe tendinosis of the    Patient Stated Goals  to reduce pain, return to work duties, improve ROM and strength     Currently in Pain?  No/denies       TREATMENT   Manual Therapy PROM of R shoulder for flexion,  abduction, ER, and IR with end range holds x multiple bouts in each direction; STM superficial and deep techniques to R UT/levator scap to decrease muscle tension, pain, 25% improvement noted in soft tissue integrity, pt reported feeling "looser".  Ther-ex UE ranger R shoulder flexion, scaption, and CW/CCW circles x 20 each direction bilteral; R elbow AROM gentle extension stretch60sec IR walkouts x10 with YTb (1-2 steps) Shoulder extension walkouts x10 with YTB Standing R shoulder IR  x10 with YTB  Pt educated throughout session about proper posture and technique with exercises. Improved exercise technique, movement at target joints, use of target muscles after min to mod verbal, visual, tactile cues.    PT Education - 06/10/18 1731    Education Details  exercise technique/form, HEP    Person(s) Educated  Patient    Methods  Explanation;Demonstration;Verbal cues    Comprehension  Verbalized understanding;Returned demonstration       PT Short Term Goals - 06/10/18 1731      PT SHORT TERM GOAL #1   Title  Patient will be independent in home exercise program to improve strength/mobility for better functional independence with ADLs.    Baseline  Patient is currently following HEP per post-op protocol which will need to be updated.    Time  4    Period  Weeks    Status  On-going    Target Date  06/26/18      PT SHORT TERM GOAL #2   Title  Patient will report a worst pain of 3/10 on VAS in  R shoulder to improve tolerance with ADLs and reduced symptoms with activities.     Baseline  04/29/18: 6/10, 05/29/18: Reports 5/10 upon arrival today but mostly minimal pain    Time  4    Period  Weeks    Status  Partially Met    Target Date  06/26/18        PT Long Term Goals - 06/10/18 1734      PT LONG TERM GOAL #1   Title  Patient will report a worst pain of 4/10 on VAS in R shoulder to improve tolerance with ADLs and reduced symptoms with activities.     Baseline  04/29/18: 6/10;  05/29/18: Reports 5/10 upon arrival today but mostly minimal pain    Time  8    Period  Weeks    Status  Partially Met    Target Date  07/24/18      PT LONG TERM GOAL #2   Title  Patient will improve shoulder AROM to > 160 degrees of flexion, scaption, and abduction for improved ability to perform overhead activities.    Baseline  04/29/2018: supine: flexion: 125 degrees with pain and guarding, abduction: 89 degrees with pain; 05/22/18: flexion: 168, Abduction: 158, ER: 72, IR: 70    Time  8    Period  Weeks    Status  Partially Met    Target Date  07/24/18  PT LONG TERM GOAL #3   Title  Patient will decrease Quick DASH score by > 8 points demonstrating reduced self-reported upper extremity disability.    Baseline  04/29/2018: 88% 06/10/2018 53%    Time  8    Period  Weeks    Target Date  07/24/18      PT LONG TERM GOAL #4   Title  Patient will demonstrate 4/5 strength in RUE to allow patient to return to work and perform job duties without compensations.     Baseline  04/29/2018: elbow flexion/extension: 2-/5, Shoulder flexion: not tested due to post op protocol; 05/29/18: Not tested due to post-op protocol    Time  8    Period  Weeks    Status  Deferred    Target Date  07/24/18      PT LONG TERM GOAL #5   Title  Patient will decrease Quick DASH score by > 8 points (gross 48.8%, work 67%) demonstrating reduced self-reported upper extremity disability.    Baseline  9/4: 56.8%, work 75%; 10/7: general:  61.4%  work 100% 11/5: 59% Work 75%, 04/29/2018: 88%; deferred pt has not returned to work    Time  [place] - 06/10/18 1738    Clinical Impression Statement  Pt with no complaints of pain throughout session today, did have some soreness post R elbow stretches and isometric work of R RTC. The patient reported feeling 60% improvement since initial evaluation, and has been gradually progressing per protocol to return to PLOF. The patient would benefit  from further skilled PT to continue to address deficits in functional abilities, strength, and ROM.  Goals carried forward at this time due to recently being assessed.    Rehab Potential  Fair    Clinical Impairments Affecting Rehab Potential  (+) age, educational level (-) secondary injury     PT Frequency  2x / week    PT Duration  8 weeks    PT Treatment/Interventions  ADLs/Self Care Home Management;Aquatic Therapy;Cryotherapy;Electrical Stimulation;Parrafin;Ultrasound;Traction;Moist Heat;Iontophoresis 34m/ml Dexamethasone;Functional mobility training;Therapeutic activities;Therapeutic exercise;Patient/family education;Neuromuscular re-education;Manual techniques;Manual lymph drainage;Compression bandaging;Taping;Energy conservation;Dry needling;Passive range of motion;Scar mobilization    PT Next Visit Plan  PROM, AAROM, and AROM as appropriate and pain-free    PT Home Exercise Plan  Protocol attached, scapular retractions, AAROM/AROM of R shoulder for flexion/abduction, AAROM for ER, unweighted AROM R elbow flex/ext (no lifting), passive R elbow stretching, AROM for forearm supination, as well as wrist flex/ext, isometrics for R shoudler including flexion, extension, abduction, adduction, IR, and ER.    Consulted and Agree with Plan of Care  Patient       Patient will benefit from skilled therapeutic intervention in order to improve the following deficits and impairments:  Decreased activity tolerance, Decreased coordination, Decreased endurance, Decreased mobility, Decreased range of motion, Decreased strength, Decreased scar mobility, Hypomobility, Increased edema, Impaired flexibility, Impaired perceived functional ability, Increased muscle spasms, Impaired UE functional use, Postural dysfunction, Improper body mechanics, Pain  Visit Diagnosis: Acute pain of right shoulder  Muscle weakness (generalized)     Problem List Patient Active Problem List   Diagnosis Date Noted  . Genetic  testing 03/21/2018  . Family history of breast cancer 03/06/2018  . Family history of ovarian cancer 03/06/2018  . Family history of esophageal cancer    DLieutenant DiegoPT, DPT 5:42 PM,06/10/18 [id number]  [place], [place], [address] Phone: [phone]   Fax:  [phone]  Name: JOCABED CHEESE MRN: [id number] Date of Birth: 04-Oct-1970

## 2018-06-11 ENCOUNTER — Ambulatory Visit
Admission: RE | Admit: 2018-06-11 | Discharge: 2018-06-11 | Disposition: A | Discharge status: Home / Self Care | Payer: 59 | Source: Ambulatory Visit | Attending: Internal Medicine | Admitting: Internal Medicine

## 2018-06-11 DIAGNOSIS — Z1231 Encounter for screening mammogram for malignant neoplasm of breast: Secondary | ICD-10-CM | POA: Diagnosis not present

## 2018-06-12 ENCOUNTER — Ambulatory Visit: Payer: 59

## 2018-06-12 DIAGNOSIS — M6281 Muscle weakness (generalized): Secondary | ICD-10-CM

## 2018-06-12 DIAGNOSIS — M25511 Pain in right shoulder: Secondary | ICD-10-CM

## 2018-06-12 NOTE — Therapy (Signed)
[place] MAIN [facility] SERVICES [address] [place], [place], [address] Phone: [phone]   Fax:  [phone]  Physical Therapy Treatment  Patient Details  Name: Charlene Daniels MRN: [id number] Date of Birth: 04-03-1971 No data recorded  Encounter Date: 06/12/2018  PT End of Session - 06/12/18 1742    Visit Number  11    Number of Visits  20    Date for PT Re-Evaluation  07/24/18    Authorization Type  last goals: 04/29/18    PT Start Time  1645    PT Stop Time  1731    PT Time Calculation (min)  46 min    Activity Tolerance  Patient tolerated treatment well    Behavior During Therapy  [facility] for tasks assessed/performed       Past Medical History:  Diagnosis Date  . Arthritis   . Chronic kidney disease    on meds to protect kidney from DM  . Diabetes mellitus without complication ([place])   . Family history of breast cancer 03/06/2018  . Family history of esophageal cancer   . Family history of ovarian cancer 03/06/2018  . GERD (gastroesophageal reflux disease)   . Headache(784.0)   . PONV (postoperative nausea and vomiting)     Past Surgical History:  Procedure Laterality Date  . ABDOMINAL HYSTERECTOMY  2009  . CHOLECYSTECTOMY  1994  . DILATION AND CURETTAGE OF UTERUS  1994  . LYMPH NODE DISSECTION Right 1999   right neck cervical LN dissection after Cat-scratch fever  . SHOULDER ARTHROSCOPY WITH OPEN ROTATOR CUFF REPAIR Right 09/19/2017   Procedure: SHOULDER ARTHROSCOPY WITH OPEN ROTATOR CUFF REPAIR & SUBACROMINAL DECOMPRESSION.;  Surgeon: Thornton Park, MD;  Location: [facility];  Service: Orthopedics;  Laterality: Right;  . SHOULDER ARTHROSCOPY WITH ROTATOR CUFF REPAIR AND SUBACROMIAL DECOMPRESSION Left 02/12/2013   Procedure: LEFT SHOULDER ARTHROSCOPY WITH SUBACROMIAL DECOMPRESSION, DISTAL CLAVICLE RESECTION, POSSIBLE BICEP TENOTOMY, POSSIBLE ROTATOR CUFF REPAIR;  Surgeon: Marin Shutter, MD;  Location: [place];  Service:  Orthopedics;  Laterality: Left;  . TONSILLECTOMY  1998    There were no vitals filed for this visit.  Subjective Assessment - 06/12/18 1732    Subjective  Patient reports her stiffness and headache is improving however still is slightly tight. Wants to continue strengthening her shoulder.     Pertinent History  Patient has has arthroscopic biceps tenodesis on 03/27/2018 since previously reported pertinent history. Patient was 8 weeks out right shoulder arthroscopy with mini open rotator cuff repair (performed 5/2)  when she moved her waist band feeling like "her arm had been ripped off". Went back to PT and her therapist felt like it had been retorn since she was not able to move arm. Mack Guise said MRI was false positive on tear, wanted pt to go to neurology thinking pain is from neck.  Was thinking it potentially could be complex regional pain syndrome. Patient wanted second opinion so went to [facility] in [place] to [employer]. Was not torn, anchor is lifted, where the repair is was not torn. Prior rotator cuff repair. Severe tendinosis of the supraspinatus tendon with a high-grade partial-thickness articular surface tear with a possible small full-thickness component. Severe tendinosis of the infraspinatus tendon.. Finished up steroid taper form Kransinski which helped with numb and ache.   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   Patient has lymphedema on R  Side.  Limitations  Reading;Lifting;Sitting;Walking;House hold activities;Other (comment)    Diagnostic tests  MRI: Prior rotator cuff repair. Severe tendinosis of the    Patient Stated Goals  to reduce pain, return to work duties, improve ROM and strength     Currently in Pain?  No/denies       Manual Therapy  PROM of R shoulder for flexion, abduction, ER, and IR with end range holds x multiple bouts in each direction; STM superficial and deep techniques to R UT/levator scap  to decrease muscle tension, pain, 25% improvement noted in soft tissue integrity, pt reported feeling "looser".   Ther-ex  UE ranger R shoulder flexion, scaption, and CW/CCW circles x 10 each direction (clockwise, counterclockwise) , ABC performed Scapular stabilization in supine position with pertubations at elbow height for scapular strengthening x 30 seconds x 3 trials Shoulder extension YTB ~30 degrees standing 10x Shoulder abduction YTB standing on band 10x to 45 degrees. Tactile cueing for shoulder positioning Seated shoulder ER with tactile cueing to shoulder for positioning and elbow. 12x Supine: YTB ER RUE with PT holding opp end and pillows underneath for positioning 15x Standing against wall for postural cueing, verbal cues for keeping shoulders against wall and tactile assistance to R shoulder to prevent shrug, overhead flexion of bilateral UE's x 10   Pt educated throughout session about proper posture and technique with exercises. Improved exercise technique, movement at target joints, use of target muscles after min to mod verbal, visual, tactile cues.                         PT Education - 06/12/18 1740    Education Details  exercise technique/stability, strengthening    Person(s) Educated  Patient    Methods  Explanation;Demonstration;Tactile cues    Comprehension  Verbalized understanding;Need further instruction;Returned demonstration;Tactile cues required;Verbal cues required       PT Short Term Goals - 06/10/18 1731      PT SHORT TERM GOAL #1   Title  Patient will be independent in home exercise program to improve strength/mobility for better functional independence with ADLs.    Baseline  Patient is currently following HEP per post-op protocol which will need to be updated.    Time  4    Period  Weeks    Status  On-going    Target Date  06/26/18      PT SHORT TERM GOAL #2   Title  Patient will report a worst pain of 3/10 on VAS in  R  shoulder to improve tolerance with ADLs and reduced symptoms with activities.     Baseline  04/29/18: 6/10, 05/29/18: Reports 5/10 upon arrival today but mostly minimal pain    Time  4    Period  Weeks    Status  Partially Met    Target Date  06/26/18        PT Long Term Goals - 06/10/18 1734      PT LONG TERM GOAL #1   Title  Patient will report a worst pain of 4/10 on VAS in R shoulder to improve tolerance with ADLs and reduced symptoms with activities.     Baseline  04/29/18: 6/10; 05/29/18: Reports 5/10 upon arrival today but mostly minimal pain    Time  8    Period  Weeks    Status  Partially Met    Target Date  07/24/18      PT LONG TERM GOAL #2   Title  Patient will improve shoulder AROM to > 160 degrees of flexion, scaption, and abduction for improved ability to perform overhead activities.    Baseline  04/29/2018: supine: flexion: 125 degrees with pain and guarding, abduction: 89 degrees with pain; 05/22/18: flexion: 168, Abduction: 158, ER: 72, IR: 70    Time  8    Period  Weeks    Status  Partially Met    Target Date  07/24/18      PT LONG TERM GOAL #3   Title  Patient will decrease Quick DASH score by > 8 points demonstrating reduced self-reported upper extremity disability.    Baseline  04/29/2018: 88% 06/10/2018 53%    Time  8    Period  Weeks    Target Date  07/24/18      PT LONG TERM GOAL #4   Title  Patient will demonstrate 4/5 strength in RUE to allow patient to return to work and perform job duties without compensations.     Baseline  04/29/2018: elbow flexion/extension: 2-/5, Shoulder flexion: not tested due to post op protocol; 05/29/18: Not tested due to post-op protocol    Time  8    Period  Weeks    Status  Deferred    Target Date  07/24/18      PT LONG TERM GOAL #5   Title  Patient will decrease Quick DASH score by > 8 points (gross 48.8%, work 67%) demonstrating reduced self-reported upper extremity disability.    Baseline  9/4: 56.8%, work 75%; 10/7:  general:  61.4%  work 100% 11/5: 59% Work 75%, 04/29/2018: 88%; deferred pt has not returned to work    Time  [place] - 06/12/18 1744    Clinical Impression Statement  Patient presents with excellent motivation throughout therapy session today. Progression of strengthening interventions performed with tactile assistance for body positioning required. Patient challenged with maintaining proper body mechanics for prolonged periods of time fatiguing quickly. The patient would benefit from further skilled PT to continue to address deficits in functional abilities, strength, and ROM. Goals carried forward at this time due to recently being assessed.    Rehab Potential  Fair    Clinical Impairments Affecting Rehab Potential  (+) age, educational level (-) secondary injury     PT Frequency  2x / week    PT Duration  8 weeks    PT Treatment/Interventions  ADLs/Self Care Home Management;Aquatic Therapy;Cryotherapy;Electrical Stimulation;Parrafin;Ultrasound;Traction;Moist Heat;Iontophoresis 64m/ml Dexamethasone;Functional mobility training;Therapeutic activities;Therapeutic exercise;Patient/family education;Neuromuscular re-education;Manual techniques;Manual lymph drainage;Compression bandaging;Taping;Energy conservation;Dry needling;Passive range of motion;Scar mobilization    PT Next Visit Plan  PROM, AAROM, and AROM as appropriate and pain-free    PT Home Exercise Plan  Protocol attached, scapular retractions, AAROM/AROM of R shoulder for flexion/abduction, AAROM for ER, unweighted AROM R elbow flex/ext (no lifting), passive R elbow stretching, AROM for forearm supination, as well as wrist flex/ext, isometrics for R shoudler including flexion, extension, abduction, adduction, IR, and ER.    Consulted and Agree with Plan of Care  Patient       Patient will benefit from skilled therapeutic intervention in order to improve the following deficits and impairments:  Decreased  activity tolerance, Decreased coordination, Decreased endurance, Decreased mobility, Decreased range of motion, Decreased strength, Decreased scar mobility, Hypomobility, Increased edema, Impaired flexibility, Impaired perceived functional ability, Increased muscle spasms, Impaired UE functional use, Postural dysfunction, Improper body mechanics, Pain  Visit  Diagnosis: Acute pain of right shoulder  Muscle weakness (generalized)     Problem List Patient Active Problem List   Diagnosis Date Noted  . Genetic testing 03/21/2018  . Family history of breast cancer 03/06/2018  . Family history of ovarian cancer 03/06/2018  . Family history of esophageal cancer    Janna Arch, PT, DPT   06/12/2018, 5:45 PM  [place] MAIN [facility] SERVICES [address] [place], [place], [address] Phone: [phone]   Fax:  [phone]  Name: Charlene Daniels MRN: [id number] Date of Birth: [birth year]

## 2018-06-17 ENCOUNTER — Ambulatory Visit: Payer: 59

## 2018-06-17 DIAGNOSIS — M25511 Pain in right shoulder: Secondary | ICD-10-CM | POA: Diagnosis not present

## 2018-06-17 DIAGNOSIS — M6281 Muscle weakness (generalized): Secondary | ICD-10-CM | POA: Diagnosis not present

## 2018-06-17 NOTE — Therapy (Signed)
[place] MAIN [facility] SERVICES [address] [place], [place], [address] Phone: [phone]   Fax:  [phone]  Physical Therapy Treatment  Patient Details  Name: Charlene Daniels MRN: [id number] Date of Birth: 1970-11-25 No data recorded  Encounter Date: 06/17/2018  PT End of Session - 06/19/18 1124    Visit Number  12    Number of Visits  20    Date for PT Re-Evaluation  07/24/18    Authorization Type  last goals: 04/29/18    PT Start Time  1646    PT Stop Time  1729    PT Time Calculation (min)  43 min    Activity Tolerance  Patient tolerated treatment well    Behavior During Therapy  [facility] for tasks assessed/performed       Past Medical History:  Diagnosis Date  . Arthritis   . Chronic kidney disease    on meds to protect kidney from DM  . Diabetes mellitus without complication ([place])   . Family history of breast cancer 03/06/2018  . Family history of esophageal cancer   . Family history of ovarian cancer 03/06/2018  . GERD (gastroesophageal reflux disease)   . Headache(784.0)   . PONV (postoperative nausea and vomiting)     Past Surgical History:  Procedure Laterality Date  . ABDOMINAL HYSTERECTOMY  2009  . CHOLECYSTECTOMY  1994  . DILATION AND CURETTAGE OF UTERUS  1994  . LYMPH NODE DISSECTION Right 1999   right neck cervical LN dissection after Cat-scratch fever  . SHOULDER ARTHROSCOPY WITH OPEN ROTATOR CUFF REPAIR Right 09/19/2017   Procedure: SHOULDER ARTHROSCOPY WITH OPEN ROTATOR CUFF REPAIR & SUBACROMINAL DECOMPRESSION.;  Surgeon: Thornton Park, MD;  Location: [facility] ORS;  Service: Orthopedics;  Laterality: Right;  . SHOULDER ARTHROSCOPY WITH ROTATOR CUFF REPAIR AND SUBACROMIAL DECOMPRESSION Left 02/12/2013   Procedure: LEFT SHOULDER ARTHROSCOPY WITH SUBACROMIAL DECOMPRESSION, DISTAL CLAVICLE RESECTION, POSSIBLE BICEP TENOTOMY, POSSIBLE ROTATOR CUFF REPAIR;  Surgeon: Marin Shutter, MD;  Location: [place];  Service:  Orthopedics;  Laterality: Left;  . TONSILLECTOMY  1998    There were no vitals filed for this visit.  TREATMENT:  Manual Therapy  STM superficial and deep techniques to R UT/levator scap/supraspinatus to decrease muscle tension, pain, 25% improvement noted in soft tissue integrity.   Ther-ex  Supine AAROM with dowel 10x3sec holds in flexion/scaption with verbal cues for scapular retraction Supine scapular retractions x10  UE ranger R shoulder flexion, scaption, and  x 10,  ABC performed Scapular stabilization in supine position with pertubations at elbow height for scapular strengthening x 30 seconds x 3 trials Shoulder extension YTB ~30 degrees standing 15x IR/ER walkout reactive isometrics x10  Shoulder abduction YTB standing on band 10x to 45 degrees. Tactile cueing for shoulder positioning Seated shoulder ER with tactile cueing to shoulder for positioning and elbow. 20x Supine: YTB ER RUE with PT holding opp end and pillows underneath for positioning 12x (deferred due to increased pain in shoulder) Standing bilateral ER with YTB x10, no complaints of pain, verbal cues for positioning of shoulder Standing against wall for postural cueing, verbal cues for keeping shoulders against wall and tactile assistance to R shoulder to prevent shrug, overhead flexion of bilateral UE's x 10   Pt educated throughout session about proper posture and technique with exercises. Improved exercise technique, movement at target joints, use of target muscles after min to mod verbal, visual, tactile cues.    PT Short Term Goals - 06/10/18  [place] #1   Title  Patient will be independent in home exercise program to improve strength/mobility for better functional independence with ADLs.    Baseline  Patient is currently following HEP per post-op protocol which will need to be updated.    Time  4    Period  Weeks    Status  On-going    Target Date  06/26/18      PT SHORT TERM GOAL  #2   Title  Patient will report a worst pain of 3/10 on VAS in  R shoulder to improve tolerance with ADLs and reduced symptoms with activities.     Baseline  04/29/18: 6/10, 05/29/18: Reports 5/10 upon arrival today but mostly minimal pain    Time  4    Period  Weeks    Status  Partially Met    Target Date  06/26/18        PT Long Term Goals - 06/10/18 1734      PT LONG TERM GOAL #1   Title  Patient will report a worst pain of 4/10 on VAS in R shoulder to improve tolerance with ADLs and reduced symptoms with activities.     Baseline  04/29/18: 6/10; 05/29/18: Reports 5/10 upon arrival today but mostly minimal pain    Time  8    Period  Weeks    Status  Partially Met    Target Date  07/24/18      PT LONG TERM GOAL #2   Title  Patient will improve shoulder AROM to > 160 degrees of flexion, scaption, and abduction for improved ability to perform overhead activities.    Baseline  04/29/2018: supine: flexion: 125 degrees with pain and guarding, abduction: 89 degrees with pain; 05/22/18: flexion: 168, Abduction: 158, ER: 72, IR: 70    Time  8    Period  Weeks    Status  Partially Met    Target Date  07/24/18      PT LONG TERM GOAL #3   Title  Patient will decrease Quick DASH score by > 8 points demonstrating reduced self-reported upper extremity disability.    Baseline  04/29/2018: 88% 06/10/2018 53%    Time  8    Period  Weeks    Target Date  07/24/18      PT LONG TERM GOAL #4   Title  Patient will demonstrate 4/5 strength in RUE to allow patient to return to work and perform job duties without compensations.     Baseline  04/29/2018: elbow flexion/extension: 2-/5, Shoulder flexion: not tested due to post op protocol; 05/29/18: Not tested due to post-op protocol    Time  8    Period  Weeks    Status  Deferred    Target Date  07/24/18      PT LONG TERM GOAL #5   Title  Patient will decrease Quick DASH score by > 8 points (gross 48.8%, work 67%) demonstrating reduced self-reported upper  extremity disability.    Baseline  9/4: 56.8%, work 75%; 10/7: general:  61.4%  work 100% 11/5: 59% Work 75%, 04/29/2018: 88%; deferred pt has not returned to work    Time  [place] - 06/19/18 1122    Clinical Impression Statement  Patient continues to remain highly motivated to participate in therapy at this time, eager to return  to PLOF. Min-mod verbal and tactile cues for strengthening exercises to maintain form. Patient most challenged by scapular activation and endurance. The patient would benefit from further skilled PT to continue to address goals and to return to PLOF.    Rehab Potential  Fair    Clinical Impairments Affecting Rehab Potential  (+) age, educational level (-) secondary injury     PT Frequency  2x / week    PT Duration  8 weeks    PT Treatment/Interventions  ADLs/Self Care Home Management;Aquatic Therapy;Cryotherapy;Electrical Stimulation;Parrafin;Ultrasound;Traction;Moist Heat;Iontophoresis 72m/ml Dexamethasone;Functional mobility training;Therapeutic activities;Therapeutic exercise;Patient/family education;Neuromuscular re-education;Manual techniques;Manual lymph drainage;Compression bandaging;Taping;Energy conservation;Dry needling;Passive range of motion;Scar mobilization    PT Next Visit Plan  PROM, AAROM, and AROM as appropriate and pain-free    PT Home Exercise Plan  Protocol attached, scapular retractions, AAROM/AROM of R shoulder for flexion/abduction, AAROM for ER, unweighted AROM R elbow flex/ext (no lifting), passive R elbow stretching, AROM for forearm supination, as well as wrist flex/ext, isometrics for R shoudler including flexion, extension, abduction, adduction, IR, and ER.    Consulted and Agree with Plan of Care  Patient       Patient will benefit from skilled therapeutic intervention in order to improve the following deficits and impairments:  Decreased activity tolerance, Decreased coordination, Decreased endurance,  Decreased mobility, Decreased range of motion, Decreased strength, Decreased scar mobility, Hypomobility, Increased edema, Impaired flexibility, Impaired perceived functional ability, Increased muscle spasms, Impaired UE functional use, Postural dysfunction, Improper body mechanics, Pain  Visit Diagnosis: Acute pain of right shoulder  Muscle weakness (generalized)     Problem List Patient Active Problem List   Diagnosis Date Noted  . Genetic testing 03/21/2018  . Family history of breast cancer 03/06/2018  . Family history of ovarian cancer 03/06/2018  . Family history of esophageal cancer     DLieutenant Diego1/30/2020, 11:27 AM  [place] [facility] ChaseSERVICES [address]RG[address] [address]Phone: [phone]  Fax:  [phone] Name: Charlene TORAINMRN: [id number] of Birth: [birth year]

## 2018-06-19 ENCOUNTER — Ambulatory Visit: Payer: 59

## 2018-06-24 ENCOUNTER — Ambulatory Visit: Payer: 59 | Attending: Orthopedic Surgery

## 2018-06-24 DIAGNOSIS — M25511 Pain in right shoulder: Secondary | ICD-10-CM | POA: Diagnosis not present

## 2018-06-24 DIAGNOSIS — M6281 Muscle weakness (generalized): Secondary | ICD-10-CM | POA: Insufficient documentation

## 2018-06-24 NOTE — Therapy (Signed)
[place] MAIN [facility] SERVICES [place], [place], [address] Phone: [phone]   Fax:  [phone]  Physical Therapy Treatment  Patient Details  Name: Charlene Daniels MRN: [id number] Date of Birth: 13-Feb-1971 No data recorded  Encounter Date: 06/24/2018  PT End of Session - 06/24/18 1255    Visit Number  13    Number of Visits  20    Date for PT Re-Evaluation  07/24/18    Authorization Type  last goals: 04/29/18    PT Start Time  1215    PT Stop Time  1255    PT Time Calculation (min)  40 min    Activity Tolerance  Patient tolerated treatment well    Behavior During Therapy  [facility] for tasks assessed/performed       Past Medical History:  Diagnosis Date  . Arthritis   . Chronic kidney disease    on meds to protect kidney from DM  . Diabetes mellitus without complication ([place])   . Family history of breast cancer 03/06/2018  . Family history of esophageal cancer   . Family history of ovarian cancer 03/06/2018  . GERD (gastroesophageal reflux disease)   . Headache(784.0)   . PONV (postoperative nausea and vomiting)     Past Surgical History:  Procedure Laterality Date  . ABDOMINAL HYSTERECTOMY  2009  . CHOLECYSTECTOMY  1994  . DILATION AND CURETTAGE OF UTERUS  1994  . LYMPH NODE DISSECTION Right 1999   right neck cervical LN dissection after Cat-scratch fever  . SHOULDER ARTHROSCOPY WITH OPEN ROTATOR CUFF REPAIR Right 09/19/2017   Procedure: SHOULDER ARTHROSCOPY WITH OPEN ROTATOR CUFF REPAIR & SUBACROMINAL DECOMPRESSION.;  Surgeon: Thornton Park, MD;  Location: [facility] OR[facility];  Service: Orthopedics;  Laterality: Right;  . SHOULDER ARTHROSCOPY WITH ROTATOR CUFF REPAIR AND SUBACROMIAL DECOMPRESSION Left 02/12/2013   Procedure: LEFT SHOULDER ARTHROSCOPY WITH SUBACROMIAL DECOMPRESSION, DISTAL CLAVICLE RESECTION, POSSIBLE BICEP TENOTOMY, POSSIBLE ROTATOR CUFF REPAIR;  Surgeon: Marin Shutter, MD;  Location: [place];  Service:  Orthopedics;  Laterality: Left;  . TONSILLECTOMY  1998    There were no vitals filed for this visit.  Subjective Assessment - 06/24/18 1218    Subjective  Pt states that she is doing well today. She had some intermittent soreness yesterday, last night, and this morning but not currently. Performing HEP. No specific questions or concerns currently.     Pertinent History  Patient has has arthroscopic biceps tenodesis on 03/27/2018 since previously reported pertinent history. Patient was 8 weeks out right shoulder arthroscopy with mini open rotator cuff repair (performed 5/2)  when she moved her waist band feeling like "her arm had been ripped off". Went back to PT and her therapist felt like it had been retorn since she was not able to move arm. Mack Guise said MRI was false positive on tear, wanted pt to go to neurology thinking pain is from neck.  Was thinking it potentially could be complex regional pain syndrome. Patient wanted second opinion so went to [facility] in [place] to [employer]. Was not torn, anchor is lifted, where the repair is was not torn. Prior rotator cuff repair. Severe tendinosis of the supraspinatus tendon with a high-grade partial-thickness articular surface tear with a possible small full-thickness component. Severe tendinosis of the infraspinatus tendon.. Finished up steroid taper form [place] which helped with numb and ache.   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 Still not sleeping in bed ,wants to get out of  recliner.   Patient has lymphedema on R  Side.     Limitations  Reading;Lifting;Sitting;Walking;House hold activities;Other (comment)    Diagnostic tests  MRI: Prior rotator cuff repair. Severe tendinosis of the    Patient Stated Goals  to reduce pain, return to work duties, improve ROM and strength     Currently in Pain?  No/denies    Pain Onset  --         TREATMENT:   Manual Therapy AROM R shoulder flexion with MWM sustained inferior glide 2 x 10;     Ther-ex UBE 2 min forward/2 min backwards during history; Standing R shoulder AROM flexion and then scaption to available pain-free end range with cues for scapular retraction/depression, scapular assist for scaption x 10 each; Elevated table plank with alternating UE lifts x 10 bilateral; Wall push up 2 x 10; Standing R shoulder shoulder green tband resisted extension; Standing rows with green tband 2 x 10; Standing R shoulder ER with green tband 2 x 10; Standing R shoulder flexion with yellow tband 2 x 10; Standing R shoulder scaption with yellow tband 2 x 10; Pt provided red and green therabands and encouraged to add rows to her HEP;   Pt educated throughout session about proper posture and technique with exercises. Improved exercise technique, movement at target joints, use of target muscles after min to mod verbal, visual, tactile cues.   Pt demonstrates excellent motivation with therapy today. Shecontinues todemonstrateimprovement in her R shoulder AROM and is able to progress to further resisted strengthening based on her protocol. She denies any increase in pain during session today. She will need updated outcome measures and goals at next visit.Pt encouraged to continue HEP.Pt willcontinue tobenefit from PT services to address deficits in strength and range of motionin order to return to full function at home and with her work related responsibilities.                     PT Short Term Goals - 06/10/18 1731      PT SHORT TERM GOAL #1   Title  Patient will be independent in home exercise program to improve strength/mobility for better functional independence with ADLs.    Baseline  Patient is currently following HEP per post-op protocol which will need to be updated.    Time  4    Period  Weeks    Status  On-going    Target Date  06/26/18      PT SHORT TERM GOAL #2   Title  Patient will report a worst pain of 3/10 on VAS in  R shoulder to  improve tolerance with ADLs and reduced symptoms with activities.     Baseline  04/29/18: 6/10, 05/29/18: Reports 5/10 upon arrival today but mostly minimal pain    Time  4    Period  Weeks    Status  Partially Met    Target Date  06/26/18        PT Long Term Goals - 06/10/18 1734      PT LONG TERM GOAL #1   Title  Patient will report a worst pain of 4/10 on VAS in R shoulder to improve tolerance with ADLs and reduced symptoms with activities.     Baseline  04/29/18: 6/10; 05/29/18: Reports 5/10 upon arrival today but mostly minimal pain    Time  8    Period  Weeks    Status  Partially Met    Target Date  07/24/18      PT LONG TERM GOAL #2   Title  Patient will improve shoulder AROM to > 160 degrees of flexion, scaption, and abduction for improved ability to perform overhead activities.    Baseline  04/29/2018: supine: flexion: 125 degrees with pain and guarding, abduction: 89 degrees with pain; 05/22/18: flexion: 168, Abduction: 158, ER: 72, IR: 70    Time  8    Period  Weeks    Status  Partially Met    Target Date  07/24/18      PT LONG TERM GOAL #3   Title  Patient will decrease Quick DASH score by > 8 points demonstrating reduced self-reported upper extremity disability.    Baseline  04/29/2018: 88% 06/10/2018 53%    Time  8    Period  Weeks    Target Date  07/24/18      PT LONG TERM GOAL #4   Title  Patient will demonstrate 4/5 strength in RUE to allow patient to return to work and perform job duties without compensations.     Baseline  04/29/2018: elbow flexion/extension: 2-/5, Shoulder flexion: not tested due to post op protocol; 05/29/18: Not tested due to post-op protocol    Time  8    Period  Weeks    Status  Deferred    Target Date  07/24/18      PT LONG TERM GOAL #5   Title  Patient will decrease Quick DASH score by > 8 points (gross 48.8%, work 67%) demonstrating reduced self-reported upper extremity disability.    Baseline  9/4: 56.8%, work 75%; 10/7: general:  61.4%   work 100% 11/5: 59% Work 75%, 04/29/2018: 88%; deferred pt has not returned to work    Time  [place] - 06/24/18 1255    Clinical Impression Statement  Pt demonstrates excellent motivation with therapy today. Shecontinues todemonstrateimprovement in her R shoulder AROM and is able to progress to further resisted strengthening based on her protocol. She denies any increase in pain during session today. She will need updated outcome measures and goals at next visit.Pt encouraged to continue HEP.Pt willcontinue tobenefit from PT services to address deficits in strength and range of motionin order to return to full function at home and with her work related responsibilities.    Rehab Potential  Fair    Clinical Impairments Affecting Rehab Potential  (+) age, educational level (-) secondary injury     PT Frequency  2x / week    PT Duration  8 weeks    PT Treatment/Interventions  ADLs/Self Care Home Management;Aquatic Therapy;Cryotherapy;Electrical Stimulation;Parrafin;Ultrasound;Traction;Moist Heat;Iontophoresis 5m/ml Dexamethasone;Functional mobility training;Therapeutic activities;Therapeutic exercise;Patient/family education;Neuromuscular re-education;Manual techniques;Manual lymph drainage;Compression bandaging;Taping;Energy conservation;Dry needling;Passive range of motion;Scar mobilization    PT Next Visit Plan  Update outcome measures/goals, PROM, AAROM, and AROM as appropriate and pain-free    PT Home Exercise Plan  Protocol attached, scapular retractions, AAROM/AROM of R shoulder for flexion/abduction, AAROM for ER, unweighted AROM R elbow flex/ext (no lifting), passive R elbow stretching, AROM for forearm supination, as well as wrist flex/ext, isometrics for R shoudler including flexion, extension, abduction, adduction, IR, and ER.    Consulted and Agree with Plan of Care  Patient       Patient will benefit from skilled therapeutic intervention in  order to improve the following deficits and impairments:  Decreased activity tolerance, Decreased coordination, Decreased endurance, Decreased mobility, Decreased  range of motion, Decreased strength, Decreased scar mobility, Hypomobility, Increased edema, Impaired flexibility, Impaired perceived functional ability, Increased muscle spasms, Impaired UE functional use, Postural dysfunction, Improper body mechanics, Pain  Visit Diagnosis: Acute pain of right shoulder  Muscle weakness (generalized)     Problem List Patient Active Problem List   Diagnosis Date Noted  . Genetic testing 03/21/2018  . Family history of breast cancer 03/06/2018  . Family history of ovarian cancer 03/06/2018  . Family history of esophageal cancer     Phillips Grout PT, DPT, GCS  Yenifer Saccente 06/24/2018, 1:21 PM  [place] MAIN [facility] SERVICES [address] [place], [place], [address] Phone: [phone]   Fax:  [phone]  Name: Charlene Daniels MRN: [id number] Date of Birth: April 01, 1971

## 2018-06-26 ENCOUNTER — Ambulatory Visit: Payer: 59

## 2018-06-27 DIAGNOSIS — S92351K Displaced fracture of fifth metatarsal bone, right foot, subsequent encounter for fracture with nonunion: Secondary | ICD-10-CM | POA: Diagnosis not present

## 2018-06-29 ENCOUNTER — Ambulatory Visit
Admission: EM | Admit: 2018-06-29 | Discharge: 2018-06-29 | Disposition: A | Discharge status: Home / Self Care | Payer: 59

## 2018-06-29 DIAGNOSIS — J029 Acute pharyngitis, unspecified: Secondary | ICD-10-CM | POA: Diagnosis not present

## 2018-06-29 DIAGNOSIS — J019 Acute sinusitis, unspecified: Secondary | ICD-10-CM | POA: Diagnosis not present

## 2018-06-29 DIAGNOSIS — R6889 Other general symptoms and signs: Secondary | ICD-10-CM | POA: Diagnosis not present

## 2018-07-01 ENCOUNTER — Ambulatory Visit: Payer: 59

## 2018-07-03 ENCOUNTER — Ambulatory Visit: Payer: 59

## 2018-07-03 DIAGNOSIS — M6281 Muscle weakness (generalized): Secondary | ICD-10-CM

## 2018-07-03 DIAGNOSIS — M25511 Pain in right shoulder: Secondary | ICD-10-CM

## 2018-07-03 NOTE — Therapy (Signed)
[place] MAIN [facility] SERVICES [address] [place], [place], [address] Phone: [phone]   Fax:  [phone]  Physical Therapy Treatment  Patient Details  Name: Charlene Daniels MRN: [id number] Date of Birth: Jan 03, 1971 No data recorded  Encounter Date: 07/03/2018  PT End of Session - 07/03/18 1649    Visit Number  14    Number of Visits  20    Date for PT Re-Evaluation  07/24/18    Authorization Type  last goals: 04/29/18    PT Start Time  1644    PT Stop Time  1728    PT Time Calculation (min)  44 min    Activity Tolerance  Patient tolerated treatment well;Other (comment)   due to still being sick   Behavior During Therapy  [facility] for tasks assessed/performed       Past Medical History:  Diagnosis Date  . Arthritis   . Chronic kidney disease    on meds to protect kidney from DM  . Diabetes mellitus without complication ([place])   . Family history of breast cancer 03/06/2018  . Family history of esophageal cancer   . Family history of ovarian cancer 03/06/2018  . GERD (gastroesophageal reflux disease)   . Headache(784.0)   . PONV (postoperative nausea and vomiting)     Past Surgical History:  Procedure Laterality Date  . ABDOMINAL HYSTERECTOMY  2009  . CHOLECYSTECTOMY  1994  . DILATION AND CURETTAGE OF UTERUS  1994  . LYMPH NODE DISSECTION Right 1999   right neck cervical LN dissection after Cat-scratch fever  . SHOULDER ARTHROSCOPY WITH OPEN ROTATOR CUFF REPAIR Right 09/19/2017   Procedure: SHOULDER ARTHROSCOPY WITH OPEN ROTATOR CUFF REPAIR & SUBACROMINAL DECOMPRESSION.;  Surgeon: Thornton Park, MD;  Location: [facility] ORS;  Service: Orthopedics;  Laterality: Right;  . SHOULDER ARTHROSCOPY WITH ROTATOR CUFF REPAIR AND SUBACROMIAL DECOMPRESSION Left 02/12/2013   Procedure: LEFT SHOULDER ARTHROSCOPY WITH SUBACROMIAL DECOMPRESSION, DISTAL CLAVICLE RESECTION, POSSIBLE BICEP TENOTOMY, POSSIBLE ROTATOR CUFF REPAIR;  Surgeon: Marin Shutter,  MD;  Location: [facility];  Service: Orthopedics;  Laterality: Left;  . TONSILLECTOMY  1998    There were no vitals filed for this visit.  Subjective Assessment - 07/03/18 1647    Subjective  Patient reports she has been sick since Saturday and has not left bed much. Missed work multiple days and is now on antibiotics. Due to feeling sickpatietn has not done a lot of her HEP    Pertinent History  Patient has has arthroscopic biceps tenodesis on 03/27/2018 since previously reported pertinent history. Patient was 8 weeks out right shoulder arthroscopy with mini open rotator cuff repair (performed 5/2)  when she moved her waist band feeling like "her arm had been ripped off". Went back to PT and her therapist felt like it had been retorn since she was not able to move arm. Mack Guise said MRI was false positive on tear, wanted pt to go to neurology thinking pain is from neck.  Was thinking it potentially could be complex regional pain syndrome. Patient wanted second opinion so went to [facility] in [place] to [employer]. Was not torn, anchor is lifted, where the repair is was not torn. Prior rotator cuff repair. Severe tendinosis of the supraspinatus tendon with a high-grade partial-thickness articular surface tear with a possible small full-thickness component. Severe tendinosis of the infraspinatus tendon.. Finished up steroid taper form Kransinski which helped with numb and ache.   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 Still not sleeping in bed ,wants to get out of recliner.   Patient has lymphedema on R  Side.     Limitations  Reading;Lifting;Sitting;Walking;House hold activities;Other (comment)    Diagnostic tests  MRI: Prior rotator cuff repair. Severe tendinosis of the    Patient Stated Goals  to reduce pain, return to work duties, improve ROM and strength     Currently in Pain?  No/denies       TREATMENT:  Ther-ex  UBE 2 min forward/2 min backwards during history; Standing R shoulder AROM  flexion and then scaption to available pain-free end range with cues for scapular retraction/depression, scapular assist for scaption x 10 each;(posterior glide to scapula) Wall push up with push up plus at end 12x Standing R shoulder shoulder red tband resisted extension 10x Seated single arm row with full supination and pronation RUE GTB 15x Seated ER RTB 15x with verbal cueing to keep neutral shoulder position and elbows at side.  Standing R shoulder flexion with red tband 12x Standing R shoulder scaption with red tband  12x Seated R shoulder abduction with stepping on red bed: 10x  Body blade:  Flexion 20-55 degrees for pain free ROM 30 seconds  Abduction 20-55 degrees for pain free ROM 30 seconds Swiss ball:  Forward flexion AAROM for pain relief 10x    Pt educated throughout session about proper posture and technique with exercises. Improved exercise technique, movement at target joints, use of target muscles after min to mod verbal, visual, tactile cues.                              PT Education - 07/03/18 1648    Education Details  exercise technique/stability, strengthening    Person(s) Educated  Patient    Methods  Explanation;Demonstration;Tactile cues;Verbal cues    Comprehension  Verbalized understanding;Returned demonstration;Verbal cues required;Tactile cues required;Need further instruction       PT Short Term Goals - 06/10/18 1731      PT SHORT TERM GOAL #1   Title  Patient will be independent in home exercise program to improve strength/mobility for better functional independence with ADLs.    Baseline  Patient is currently following HEP per post-op protocol which will need to be updated.    Time  4    Period  Weeks    Status  On-going    Target Date  06/26/18      PT SHORT TERM GOAL #2   Title  Patient will report a worst pain of 3/10 on VAS in  R shoulder to improve tolerance with ADLs and reduced symptoms with activities.     Baseline   04/29/18: 6/10, 05/29/18: Reports 5/10 upon arrival today but mostly minimal pain    Time  4    Period  Weeks    Status  Partially Met    Target Date  06/26/18        PT Long Term Goals - 06/10/18 1734      PT LONG TERM GOAL #1   Title  Patient will report a worst pain of 4/10 on VAS in R shoulder to improve tolerance with ADLs and reduced symptoms with activities.     Baseline  04/29/18: 6/10; 05/29/18: Reports 5/10 upon arrival today but mostly minimal pain    Time  8    Period  Weeks    Status  Partially Met    Target Date  07/24/18  PT LONG TERM GOAL #2   Title  Patient will improve shoulder AROM to > 160 degrees of flexion, scaption, and abduction for improved ability to perform overhead activities.    Baseline  04/29/2018: supine: flexion: 125 degrees with pain and guarding, abduction: 89 degrees with pain; 05/22/18: flexion: 168, Abduction: 158, ER: 72, IR: 70    Time  8    Period  Weeks    Status  Partially Met    Target Date  07/24/18      PT LONG TERM GOAL #3   Title  Patient will decrease Quick DASH score by > 8 points demonstrating reduced self-reported upper extremity disability.    Baseline  04/29/2018: 88% 06/10/2018 53%    Time  8    Period  Weeks    Target Date  07/24/18      PT LONG TERM GOAL #4   Title  Patient will demonstrate 4/5 strength in RUE to allow patient to return to work and perform job duties without compensations.     Baseline  04/29/2018: elbow flexion/extension: 2-/5, Shoulder flexion: not tested due to post op protocol; 05/29/18: Not tested due to post-op protocol    Time  8    Period  Weeks    Status  Deferred    Target Date  07/24/18      PT LONG TERM GOAL #5   Title  Patient will decrease Quick DASH score by > 8 points (gross 48.8%, work 67%) demonstrating reduced self-reported upper extremity disability.    Baseline  9/4: 56.8%, work 75%; 10/7: general:  61.4%  work 100% 11/5: 59% Work 75%, 04/29/2018: 88%; deferred pt has not returned to  work    Time  [place] - 07/03/18 1754    Clinical Impression Statement  Patient presents to therapy after illness feeling weaker and still "not 100%". Patient demonstrated good motivation despite illness and progressed to utilizing body blade for stabilization. Patient challenged with strength at 90 degree range with compensatory hiking of R shoulder. She denies any increase in pain during session today.Pt willcontinue tobenefit from PT services to address deficits in strength and range of motionin order to return to full function at home and with her work related responsibilities.    Rehab Potential  Fair    Clinical Impairments Affecting Rehab Potential  (+) age, educational level (-) secondary injury     PT Frequency  2x / week    PT Duration  8 weeks    PT Treatment/Interventions  ADLs/Self Care Home Management;Aquatic Therapy;Cryotherapy;Electrical Stimulation;Parrafin;Ultrasound;Traction;Moist Heat;Iontophoresis 37m/ml Dexamethasone;Functional mobility training;Therapeutic activities;Therapeutic exercise;Patient/family education;Neuromuscular re-education;Manual techniques;Manual lymph drainage;Compression bandaging;Taping;Energy conservation;Dry needling;Passive range of motion;Scar mobilization    PT Next Visit Plan  Update outcome measures/goals, PROM, AAROM, and AROM as appropriate and pain-free    PT Home Exercise Plan  Protocol attached, scapular retractions, AAROM/AROM of R shoulder for flexion/abduction, AAROM for ER, unweighted AROM R elbow flex/ext (no lifting), passive R elbow stretching, AROM for forearm supination, as well as wrist flex/ext, isometrics for R shoudler including flexion, extension, abduction, adduction, IR, and ER.    Consulted and Agree with Plan of Care  Patient       Patient will benefit from skilled therapeutic intervention in order to improve the following deficits and impairments:  Decreased activity tolerance, Decreased  coordination, Decreased endurance, Decreased mobility, Decreased range of motion, Decreased strength, Decreased scar mobility,  Hypomobility, Increased edema, Impaired flexibility, Impaired perceived functional ability, Increased muscle spasms, Impaired UE functional use, Postural dysfunction, Improper body mechanics, Pain  Visit Diagnosis: Acute pain of right shoulder  Muscle weakness (generalized)     Problem List Patient Active Problem List   Diagnosis Date Noted  . Genetic testing 03/21/2018  . Family history of breast cancer 03/06/2018  . Family history of ovarian cancer 03/06/2018  . Family history of esophageal cancer    Janna Arch, PT, DPT   07/03/2018, 5:57 PM  [place] MAIN [facility] SERVICES [address] [place], [place], [address] Phone: [phone]   Fax:  [phone]  Name: Charlene Daniels MRN: [id number] Date of Birth: October 27, 1970

## 2018-07-07 ENCOUNTER — Ambulatory Visit
Admission: RE | Admit: 2018-07-07 | Discharge: 2018-07-07 | Disposition: A | Discharge status: Home / Self Care | Payer: 59 | Source: Ambulatory Visit | Attending: Surgery | Admitting: Surgery

## 2018-07-07 DIAGNOSIS — Z13820 Encounter for screening for osteoporosis: Secondary | ICD-10-CM | POA: Diagnosis not present

## 2018-07-07 DIAGNOSIS — M8580 Other specified disorders of bone density and structure, unspecified site: Secondary | ICD-10-CM | POA: Insufficient documentation

## 2018-07-08 ENCOUNTER — Ambulatory Visit: Payer: 59

## 2018-07-10 ENCOUNTER — Ambulatory Visit: Payer: 59

## 2018-07-15 ENCOUNTER — Ambulatory Visit: Payer: 59

## 2018-07-15 DIAGNOSIS — M6281 Muscle weakness (generalized): Secondary | ICD-10-CM

## 2018-07-15 DIAGNOSIS — M25511 Pain in right shoulder: Secondary | ICD-10-CM | POA: Diagnosis not present

## 2018-07-15 NOTE — Therapy (Signed)
[facility] MAIN REHAB SERVICES [address] [place], [place], [address] Phone: [phone]   Fax:  [phone]  Physical Therapy Treatment  Patient Details  Name: Charlene Daniels MRN: [id number] Date of Birth: 01/13/1971 No data recorded  Encounter Date: 07/15/2018  PT End of Session - 07/16/18 2125    Visit Number  15    Number of Visits  20    Date for PT Re-Evaluation  07/24/18    Authorization Type  last goals: 04/29/18    PT Start Time  1647    PT Stop Time  1735    PT Time Calculation (min)  48 min    Activity Tolerance  Patient tolerated treatment well;Other (comment)   due to still being sick   Behavior During Therapy  WFL for tasks assessed/performed       Past Medical History:  Diagnosis Date  . Arthritis   . Chronic kidney disease    on meds to protect kidney from DM  . Diabetes mellitus without complication ([facility])   . Family history of breast cancer 03/06/2018  . Family history of esophageal cancer   . Family history of ovarian cancer 03/06/2018  . GERD (gastroesophageal reflux disease)   . Headache(784.0)   . PONV (postoperative nausea and vomiting)     Past Surgical History:  Procedure Laterality Date  . ABDOMINAL HYSTERECTOMY  2009  . CHOLECYSTECTOMY  1994  . DILATION AND CURETTAGE OF UTERUS  1994  . LYMPH NODE DISSECTION Right 1999   right neck cervical LN dissection after Cat-scratch fever  . SHOULDER ARTHROSCOPY WITH OPEN ROTATOR CUFF REPAIR Right 09/19/2017   Procedure: SHOULDER ARTHROSCOPY WITH OPEN ROTATOR CUFF REPAIR & SUBACROMINAL DECOMPRESSION.;  Surgeon: Krasinski, Kevin, MD;  Location: [facility];  Service: Orthopedics;  Laterality: Right;  . SHOULDER ARTHROSCOPY WITH ROTATOR CUFF REPAIR AND SUBACROMIAL DECOMPRESSION Left 02/12/2013   Procedure: LEFT SHOULDER ARTHROSCOPY WITH SUBACROMIAL DECOMPRESSION, DISTAL CLAVICLE RESECTION, POSSIBLE BICEP TENOTOMY, POSSIBLE ROTATOR CUFF REPAIR;  Surgeon: Kevin M Supple,  MD;  Location: [facility];  Service: Orthopedics;  Laterality: Left;  . TONSILLECTOMY  1998    There were no vitals filed for this visit.  Subjective Assessment - 07/15/18 1654    Subjective  Pt states that she is doing well today. She reports intermittent R shoulder soreness but not upon arrival. No specific questions or concerns. She states that she has been having some vertigo when laying down and believes that she is having problems with the crystals in her ears.     Pertinent History  Patient has has arthroscopic biceps tenodesis on 03/27/2018 since previously reported pertinent history. Patient was 8 weeks out right shoulder arthroscopy with mini open rotator cuff repair (performed 5/2)  when she moved her waist band feeling like "her arm had been ripped off". Went back to PT and her therapist felt like it had been retorn since she was not able to move arm. Krasinski said MRI was false positive on tear, wanted pt to go to neurology thinking pain is from neck.  Was thinking it potentially could be complex regional pain syndrome. Patient wanted second opinion so went to [facility] in [place] to [facility]. Was not torn, anchor is lifted, where the repair is was not torn. Prior rotator cuff repair. Severe tendinosis of the supraspinatus tendon with a high-grade partial-thickness articular surface tear with a possible small full-thickness component. Severe tendinosis of the infraspinatus tendon.. Finished up steroid taper form Kransinski which   helped with numb and ache.   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 Still not sleeping in bed ,wants to get out of recliner.   Patient has lymphedema on R  Side.     Limitations  Reading;Lifting;Sitting;Walking;House hold activities;Other (comment)    Diagnostic tests  MRI: Prior rotator cuff repair. Severe tendinosis of the    Patient Stated Goals  to reduce pain, return to work duties, improve ROM and strength     Currently in Pain?  No/denies          TREATMENT:   Ther-ex R shoulder UE ranger flexion, scaption, and circles (CW/CCW) x 20 each; StandingR shoulder AROM flexionand then scaptionto available pain-free end rangewithcues for scapular retraction/depression,  L sidelying R shoulder AROM scaption with 2# dumbbell (DB) x 10; L sidelying R shoulder AROM flexion with 2# DB x 10; Prone R rows with 2# DB x 10; Prone R shoulder extension 2# DB x 10; Prone R shoulder horizontal abduction 2# DB x 10; Prone R shoulder flexion 2# DB x 10;   Manual Therapy  PROM R shoulder flexion, scaption, IR, and ER; STM to posterior, lateral, and anterior R shoulder. Pt with significant guarding and low threshold for pain. Pt encouraged to perform self-massage at home;   Neuromuscular Re-education  Dix-Hallpike negative on the R and positive on the L for upbeating L torsional nystagmus 10-15 seconds in duration with concurrent vertigo. Roll test negative bilaterally. Pt tested multiple times utilizing IR goggles due to how faint the nystagmus appears.   Pt educated throughout session about proper posture and technique with exercises. Improved exercise technique, movement at target joints, use of target muscles after min to mod verbal, visual, tactile cues.   Pt demonstrates excellent motivation with therapy today. Shecontinues todemonstrateimprovement in her R shoulder AROM and is able to progress to further resisted strengthening based on her protocol. She is very tender to palpation along entire posterior, lateral, and anterior R shoulder. Pt encouraged to perform STM at home. She is complaining of vertigo when rolling in bed so BPPV testing performed.  Dix-Hallpike testing is positive on the L for upbeating L torsional nystagmus 10-15 seconds in duration with concurrent vertigo indicating likely L posterior canal BPPV. She will need updated outcome measures and goals over the next couple of visits.Pt encouraged to continue  HEP.Pt willcontinue tobenefit from PT services to address deficits in strength and range of motionin order to return to full function at home and with her work related responsibilities.                      PT Short Term Goals - 06/10/18 1731      PT SHORT TERM GOAL #1   Title  Patient will be independent in home exercise program to improve strength/mobility for better functional independence with ADLs.    Baseline  Patient is currently following HEP per post-op protocol which will need to be updated.    Time  4    Period  Weeks    Status  On-going    Target Date  06/26/18      PT SHORT TERM GOAL #2   Title  Patient will report a worst pain of 3/10 on VAS in  R shoulder to improve tolerance with ADLs and reduced symptoms with activities.     Baseline  04/29/18: 6/10, 05/29/18: Reports 5/10 upon arrival today but mostly minimal pain    Time  4  Period  Weeks    Status  Partially Met    Target Date  06/26/18        PT Long Term Goals - 06/10/18 1734      PT LONG TERM GOAL #1   Title  Patient will report a worst pain of 4/10 on VAS in R shoulder to improve tolerance with ADLs and reduced symptoms with activities.     Baseline  04/29/18: 6/10; 05/29/18: Reports 5/10 upon arrival today but mostly minimal pain    Time  8    Period  Weeks    Status  Partially Met    Target Date  07/24/18      PT LONG TERM GOAL #2   Title  Patient will improve shoulder AROM to > 160 degrees of flexion, scaption, and abduction for improved ability to perform overhead activities.    Baseline  04/29/2018: supine: flexion: 125 degrees with pain and guarding, abduction: 89 degrees with pain; 05/22/18: flexion: 168, Abduction: 158, ER: 72, IR: 70    Time  8    Period  Weeks    Status  Partially Met    Target Date  07/24/18      PT LONG TERM GOAL #3   Title  Patient will decrease Quick DASH score by > 8 points demonstrating reduced self-reported upper extremity disability.    Baseline   04/29/2018: 88% 06/10/2018 53%    Time  8    Period  Weeks    Target Date  07/24/18      PT LONG TERM GOAL #4   Title  Patient will demonstrate 4/5 strength in RUE to allow patient to return to work and perform job duties without compensations.     Baseline  04/29/2018: elbow flexion/extension: 2-/5, Shoulder flexion: not tested due to post op protocol; 05/29/18: Not tested due to post-op protocol    Time  8    Period  Weeks    Status  Deferred    Target Date  07/24/18      PT LONG TERM GOAL #5   Title  Patient will decrease Quick DASH score by > 8 points (gross 48.8%, work 67%) demonstrating reduced self-reported upper extremity disability.    Baseline  9/4: 56.8%, work 75%; 10/7: general:  61.4%  work 100% 11/5: 59% Work 75%, 04/29/2018: 88%; deferred pt has not returned to work    Time  [place] - 07/16/18 2125    Clinical Impression Statement  Pt demonstrates excellent motivation with therapy today. Shecontinues todemonstrateimprovement in her R shoulder AROM and is able to progress to further resisted strengthening based on her protocol. She is very tender to palpation along entire posterior, lateral, and anterior R shoulder. Pt encouraged to perform STM at home. She is complaining of vertigo when rolling in bed so BPPV testing performed.  Dix-Hallpike testing is positive on the L for upbeating L torsional nystagmus 10-15 seconds in duration with concurrent vertigo indicating likely L posterior canal BPPV. She will need updated outcome measures and goals over the next couple of visits.Pt encouraged to continue HEP.Pt willcontinue tobenefit from PT services to address deficits in strength and range of motionin order to return to full function at home and with her work related responsibilities.    Rehab Potential  Fair    Clinical Impairments Affecting Rehab Potential  (+) age, educational level (-) secondary injury  PT Frequency  2x / week    PT  Duration  8 weeks    PT Treatment/Interventions  ADLs/Self Care Home Management;Aquatic Therapy;Cryotherapy;Electrical Stimulation;Parrafin;Ultrasound;Traction;Moist Heat;Iontophoresis 76m/ml Dexamethasone;Functional mobility training;Therapeutic activities;Therapeutic exercise;Patient/family education;Neuromuscular re-education;Manual techniques;Manual lymph drainage;Compression bandaging;Taping;Energy conservation;Dry needling;Passive range of motion;Scar mobilization    PT Next Visit Plan  Update outcome measures/goals, PROM, AAROM, and AROM as appropriate and pain-free    PT Home Exercise Plan  Protocol attached, scapular retractions, AAROM/AROM of R shoulder for flexion/abduction, AAROM for ER, unweighted AROM R elbow flex/ext (no lifting), passive R elbow stretching, AROM for forearm supination, as well as wrist flex/ext, isometrics for R shoudler including flexion, extension, abduction, adduction, IR, and ER.    Consulted and Agree with Plan of Care  Patient       Patient will benefit from skilled therapeutic intervention in order to improve the following deficits and impairments:  Decreased activity tolerance, Decreased coordination, Decreased endurance, Decreased mobility, Decreased range of motion, Decreased strength, Decreased scar mobility, Hypomobility, Increased edema, Impaired flexibility, Impaired perceived functional ability, Increased muscle spasms, Impaired UE functional use, Postural dysfunction, Improper body mechanics, Pain  Visit Diagnosis: Acute pain of right shoulder  Muscle weakness (generalized)     Problem List Patient Active Problem List   Diagnosis Date Noted  . Genetic testing 03/21/2018  . Family history of breast cancer 03/06/2018  . Family history of ovarian cancer 03/06/2018  . Family history of esophageal cancer    JPhillips GroutPT, DPT, GCS  Huprich,Jason 07/16/2018, 9:48 PM  [facility] [address] [address]Phone: [phone]  Fax:  [phone] Name: TKHADEEJAH CASTNERMRN: [id number] of Birth: [birth year]

## 2018-07-17 ENCOUNTER — Ambulatory Visit: Payer: 59

## 2018-07-22 ENCOUNTER — Encounter: Payer: Self-pay | Admitting: Physical Therapy

## 2018-07-22 ENCOUNTER — Ambulatory Visit: Payer: 59 | Attending: Orthopedic Surgery

## 2018-07-22 DIAGNOSIS — M6281 Muscle weakness (generalized): Secondary | ICD-10-CM | POA: Insufficient documentation

## 2018-07-22 DIAGNOSIS — M25511 Pain in right shoulder: Secondary | ICD-10-CM | POA: Diagnosis not present

## 2018-07-22 NOTE — Therapy (Signed)
[place] MAIN [facility] SERVICES [address] [place], [place], [address] Phone: [phone]   Fax:  [phone]  Physical Therapy Treatment  Patient Details  Name: Charlene Daniels MRN: [id number] Date of Birth: 09-Sep-1970 No data recorded  Encounter Date: 07/22/2018  PT End of Session - 07/22/18 1740    Visit Number  16    Number of Visits  20    Date for PT Re-Evaluation  07/24/18    Authorization Type  last goals: 04/29/18    PT Start Time  1645    PT Stop Time  1730    PT Time Calculation (min)  45 min    Activity Tolerance  Patient tolerated treatment well    Behavior During Therapy  [facility] for tasks assessed/performed       Past Medical History:  Diagnosis Date  . Arthritis   . Chronic kidney disease    on meds to protect kidney from DM  . Diabetes mellitus without complication ([place])   . Family history of breast cancer 03/06/2018  . Family history of esophageal cancer   . Family history of ovarian cancer 03/06/2018  . GERD (gastroesophageal reflux disease)   . Headache(784.0)   . PONV (postoperative nausea and vomiting)     Past Surgical History:  Procedure Laterality Date  . ABDOMINAL HYSTERECTOMY  2009  . CHOLECYSTECTOMY  1994  . DILATION AND CURETTAGE OF UTERUS  1994  . LYMPH NODE DISSECTION Right 1999   right neck cervical LN dissection after Cat-scratch fever  . SHOULDER ARTHROSCOPY WITH OPEN ROTATOR CUFF REPAIR Right 09/19/2017   Procedure: SHOULDER ARTHROSCOPY WITH OPEN ROTATOR CUFF REPAIR & SUBACROMINAL DECOMPRESSION.;  Surgeon: Thornton Park, MD;  Location: [facility] ORS;  Service: Orthopedics;  Laterality: Right;  . SHOULDER ARTHROSCOPY WITH ROTATOR CUFF REPAIR AND SUBACROMIAL DECOMPRESSION Left 02/12/2013   Procedure: LEFT SHOULDER ARTHROSCOPY WITH SUBACROMIAL DECOMPRESSION, DISTAL CLAVICLE RESECTION, POSSIBLE BICEP TENOTOMY, POSSIBLE ROTATOR CUFF REPAIR;  Surgeon: Marin Shutter, MD;  Location: [place];  Service:  Orthopedics;  Laterality: Left;  . TONSILLECTOMY  1998    There were no vitals filed for this visit.   Subjective Assessment - 07/22/18 1736    Subjective  Patient enters clinic with mild pain, stated that she constantly has pain, especially when she tries to lay down to sleep. Patient expressed that she is ready for discharge in the next couple of visits for financial reasons.    Pertinent History  Patient has has arthroscopic biceps tenodesis on 03/27/2018 since previously reported pertinent history. Patient was 8 weeks out right shoulder arthroscopy with mini open rotator cuff repair (performed 5/2)  when she moved her waist band feeling like "her arm had been ripped off". Went back to PT and her therapist felt like it had been retorn since she was not able to move arm. Mack Guise said MRI was false positive on tear, wanted pt to go to neurology thinking pain is from neck.  Was thinking it potentially could be complex regional pain syndrome. Patient wanted second opinion so went to [facility] in [place] to [employer]. Was not torn, anchor is lifted, where the repair is was not torn. Prior rotator cuff repair. Severe tendinosis of the supraspinatus tendon with a high-grade partial-thickness articular surface tear with a possible small full-thickness component. Severe tendinosis of the infraspinatus tendon.. Finished up steroid taper form Kransinski which helped with numb and ache.   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 Still not  sleeping in bed ,wants to get out of recliner.   Patient has lymphedema on R  Side.     Limitations  Reading;Lifting;Sitting;Walking;House hold activities;Other (comment)    Diagnostic tests  MRI: Prior rotator cuff repair. Severe tendinosis of the    Patient Stated Goals  to reduce pain, return to work duties, improve ROM and strength     Currently in Pain?  Yes    Pain Score  2     Pain Location  Shoulder    Pain Orientation  Right    Pain Descriptors / Indicators   Aching;Sore    Pain Type  Acute pain      TREATMENT: K tape applied to bilateral shoulders for attempted tactile feedback, repositioning of shoulders, improved posture. Instructed in care at home   Ther-ex: R shoulder UE flexion, scaption, abduction AROM x10  L sidelying R shoulder AROM scaption with 2# dumbbell (DB) x 10;  L sidelying R shoulder abduction with 2# DB x10 L sidelying R shoulder AROM flexion with 2# DB x 10;  Bent over rows 2x10 at 5# Shoulder extensions R shoulder extension 2# DB x 10;  Lower trap lift offs of wall x10 Wall push ups with cues to avoid abduction position x15 Wall push ups with tricep focus x15 Isometric of R bicep x10 with 3 second hold    PT Education - 07/22/18 1738    Education Details  exercise technique/stability    Person(s) Educated  Patient    Methods  Explanation;Demonstration;Tactile cues;Verbal cues    Comprehension  Verbalized understanding;Returned demonstration;Need further instruction       PT Short Term Goals - 06/10/18 1731      PT SHORT TERM GOAL #1   Title  Patient will be independent in home exercise program to improve strength/mobility for better functional independence with ADLs.    Baseline  Patient is currently following HEP per post-op protocol which will need to be updated.    Time  4    Period  Weeks    Status  On-going    Target Date  06/26/18      PT SHORT TERM GOAL #2   Title  Patient will report a worst pain of 3/10 on VAS in  R shoulder to improve tolerance with ADLs and reduced symptoms with activities.     Baseline  04/29/18: 6/10, 05/29/18: Reports 5/10 upon arrival today but mostly minimal pain    Time  4    Period  Weeks    Status  Partially Met    Target Date  06/26/18        PT Long Term Goals - 06/10/18 1734      PT LONG TERM GOAL #1   Title  Patient will report a worst pain of 4/10 on VAS in R shoulder to improve tolerance with ADLs and reduced symptoms with activities.     Baseline  04/29/18:  6/10; 05/29/18: Reports 5/10 upon arrival today but mostly minimal pain    Time  8    Period  Weeks    Status  Partially Met    Target Date  07/24/18      PT LONG TERM GOAL #2   Title  Patient will improve shoulder AROM to > 160 degrees of flexion, scaption, and abduction for improved ability to perform overhead activities.    Baseline  04/29/2018: supine: flexion: 125 degrees with pain and guarding, abduction: 89 degrees with pain; 05/22/18: flexion: 168, Abduction: 158, ER: 72, IR:  70    Time  8    Period  Weeks    Status  Partially Met    Target Date  07/24/18      PT LONG TERM GOAL #3   Title  Patient will decrease Quick DASH score by > 8 points demonstrating reduced self-reported upper extremity disability.    Baseline  04/29/2018: 88% 06/10/2018 53%    Time  8    Period  Weeks    Target Date  07/24/18      PT LONG TERM GOAL #4   Title  Patient will demonstrate 4/5 strength in RUE to allow patient to return to work and perform job duties without compensations.     Baseline  04/29/2018: elbow flexion/extension: 2-/5, Shoulder flexion: not tested due to post op protocol; 05/29/18: Not tested due to post-op protocol    Time  8    Period  Weeks    Status  Deferred    Target Date  07/24/18      PT LONG TERM GOAL #5   Title  Patient will decrease Quick DASH score by > 8 points (gross 48.8%, work 67%) demonstrating reduced self-reported upper extremity disability.    Baseline  9/4: 56.8%, work 75%; 10/7: general:  61.4%  work 100% 11/5: 59% Work 75%, 04/29/2018: 88%; deferred pt has not returned to work    Time  4    [place] - 07/22/18 1738    Clinical Impression Statement  K tape applied to enhance tactile feedback to help facilitate scapular retraction. Patient reported feeling that it was beneficial. Patient continues to complain of movements such as shoulder extension, and weakness of RUE. The patient would benefit from further skilled PT to continue to  progress towards goals.     Rehab Potential  Fair    Clinical Impairments Affecting Rehab Potential  (+) age, educational level (-) secondary injury     PT Frequency  2x / week    PT Duration  8 weeks    PT Treatment/Interventions  ADLs/Self Care Home Management;Aquatic Therapy;Cryotherapy;Electrical Stimulation;Parrafin;Ultrasound;Traction;Moist Heat;Iontophoresis 49m/ml Dexamethasone;Functional mobility training;Therapeutic activities;Therapeutic exercise;Patient/family education;Neuromuscular re-education;Manual techniques;Manual lymph drainage;Compression bandaging;Taping;Energy conservation;Dry needling;Passive range of motion;Scar mobilization    PT Next Visit Plan  Update outcome measures/goals, PROM, AAROM, and AROM as appropriate and pain-free    PT Home Exercise Plan  Protocol attached, scapular retractions, AAROM/AROM of R shoulder for flexion/abduction, AAROM for ER, unweighted AROM R elbow flex/ext (no lifting), passive R elbow stretching, AROM for forearm supination, as well as wrist flex/ext, isometrics for R shoudler including flexion, extension, abduction, adduction, IR, and ER.    Consulted and Agree with Plan of Care  Patient       Patient will benefit from skilled therapeutic intervention in order to improve the following deficits and impairments:  Decreased activity tolerance, Decreased coordination, Decreased endurance, Decreased mobility, Decreased range of motion, Decreased strength, Decreased scar mobility, Hypomobility, Increased edema, Impaired flexibility, Impaired perceived functional ability, Increased muscle spasms, Impaired UE functional use, Postural dysfunction, Improper body mechanics, Pain  Visit Diagnosis: Acute pain of right shoulder  Muscle weakness (generalized)     Problem List Patient Active Problem List   Diagnosis Date Noted  . Genetic testing 03/21/2018  . Family history of breast cancer 03/06/2018  . Family history of ovarian cancer 03/06/2018   . Family history of esophageal cancer     DLieutenant DiegoPT, DPT 5:41  PM,07/22/18 [place] MAIN [facility] SERVICES [address] [place], [place], [address] Phone: [phone]   Fax:  [phone]  Name: Charlene Daniels MRN: [id number] Date of Birth: 13-Nov-1970

## 2018-07-24 ENCOUNTER — Ambulatory Visit: Payer: 59

## 2018-07-28 DIAGNOSIS — M7521 Bicipital tendinitis, right shoulder: Secondary | ICD-10-CM | POA: Diagnosis not present

## 2018-07-29 ENCOUNTER — Ambulatory Visit: Payer: 59

## 2018-07-29 DIAGNOSIS — M25511 Pain in right shoulder: Secondary | ICD-10-CM | POA: Diagnosis not present

## 2018-07-29 DIAGNOSIS — M6281 Muscle weakness (generalized): Secondary | ICD-10-CM

## 2018-07-29 NOTE — Therapy (Signed)
[place] MAIN [facility] SERVICES [address] [place], [place], [address] Phone: [phone]   Fax:  [phone]  Physical Therapy Treatment/Re-evaluation/Discharge Summary  Patient Details  Name: Charlene Daniels MRN: [id number] Date of Birth: Aug 15, 1970 No data recorded  Encounter Date: 07/29/2018  PT End of Session - 07/31/18 1132    Visit Number  17    Number of Visits  21    Date for PT Re-Evaluation  08/05/18    Authorization Type  --    PT Start Time  1647    PT Stop Time  1730    PT Time Calculation (min)  43 min    Activity Tolerance  Patient tolerated treatment well    Behavior During Therapy  [facility] for tasks assessed/performed       Past Medical History:  Diagnosis Date  . Arthritis   . Chronic kidney disease    on meds to protect kidney from DM  . Diabetes mellitus without complication ([place])   . Family history of breast cancer 03/06/2018  . Family history of esophageal cancer   . Family history of ovarian cancer 03/06/2018  . GERD (gastroesophageal reflux disease)   . Headache(784.0)   . PONV (postoperative nausea and vomiting)     Past Surgical History:  Procedure Laterality Date  . ABDOMINAL HYSTERECTOMY  2009  . CHOLECYSTECTOMY  1994  . DILATION AND CURETTAGE OF UTERUS  1994  . LYMPH NODE DISSECTION Right 1999   right neck cervical LN dissection after Cat-scratch fever  . SHOULDER ARTHROSCOPY WITH OPEN ROTATOR CUFF REPAIR Right 09/19/2017   Procedure: SHOULDER ARTHROSCOPY WITH OPEN ROTATOR CUFF REPAIR & SUBACROMINAL DECOMPRESSION.;  Surgeon: Thornton Park, MD;  Location: [facility]S;  Service: Orthopedics;  Laterality: Right;  . SHOULDER ARTHROSCOPY WITH ROTATOR CUFF REPAIR AND SUBACROMIAL DECOMPRESSION Left 02/12/2013   Procedure: LEFT SHOULDER ARTHROSCOPY WITH SUBACROMIAL DECOMPRESSION, DISTAL CLAVICLE RESECTION, POSSIBLE BICEP TENOTOMY, POSSIBLE ROTATOR CUFF REPAIR;  Surgeon: Marin Shutter, MD;  Location: [place];   Service: Orthopedics;  Laterality: Left;  . TONSILLECTOMY  1998    There were no vitals filed for this visit.  Subjective Assessment - 07/31/18 1205    Subjective  Pt denies any R shoulder pain upon arrival today. She saw the orthopedic PA yesterday who released her to full duty. She is ready to discharge from therapy. No specific questions or concerns at this time.    Pertinent History  Patient has has arthroscopic biceps tenodesis on 03/27/2018 since previously reported pertinent history. Patient was 8 weeks out right shoulder arthroscopy with mini open rotator cuff repair (performed 5/2)  when she moved her waist band feeling like "her arm had been ripped off". Went back to PT and her therapist felt like it had been retorn since she was not able to move arm. Mack Guise said MRI was false positive on tear, wanted pt to go to neurology thinking pain is from neck.  Was thinking it potentially could be complex regional pain syndrome. Patient wanted second opinion so went to [facility] in [place] to [employer]. Was not torn, anchor is lifted, where the repair is was not torn. Prior rotator cuff repair. Severe tendinosis of the supraspinatus tendon with a high-grade partial-thickness articular surface tear with a possible small full-thickness component. Severe tendinosis of the infraspinatus tendon.. Finished up steroid taper form Kransinski which helped with numb and ache.   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   Patient has lymphedema on R  Side.     Limitations  Reading;Lifting;Sitting;Walking;House hold activities;Other (comment)    Diagnostic tests  MRI: Prior rotator cuff repair. Severe tendinosis of the    Patient Stated Goals  to reduce pain, return to work duties, improve ROM and strength     Currently in Pain?  No/denies         TREATMENT   Ther-ex  Updated outcome measures and goals with patient including: MMT (see  below); ROM measures (see below);  Pt completed QuickDASH: 29.55% (unbilled); Reviewed HEP, provided consolidated handout with all exercises/progressions, and discussed discharge plans.    Strength RUE 4-/5* Shoulder flexion (anterior deltoid/pec major/coracobrachialis, axillary n. (C5-6) and musculocutaneous n. (C5-7)) 4-/5* Shoulder abduction (deltoid/supraspinatus, axillary/suprascapular n, C5) 4-/5* Shoulder external rotation (infraspinatus/teres minor) 4-/5* Shoulder internal rotation (subcapularis/lats/pec major) 4-/5 Shoulder horizontal abduction 5/5 Elbow flexion (biceps brachii, brachialis, brachioradialis, musculoskeletal n, C5-6) 5/5 Elbow extension (triceps, radial n, C7) 5/5 Wrist Extension 5/5 Wrist Flexion 5/5 Finger adduction (interossei, ulnar n, T1)   PROM R/L 161*/175 Shoulder flexion 150*/167 Shoulder abduction 80/105 Shoulder external rotation 70/62 Shoulder internal rotation HBB: R: L1, L: L2 HBH: R: T3, L: T7; *Indicates pain, overpressure performed unless otherwise indicated    Pt reports that she is read to finish with physical therapy at this time. She saw the orthopedic PA who discharged her back to full duty at work. Her R shoulder AROM is functional but she still has some limitations. R shoulder flexion is 161 degrees, abduction is 150 degrees, and ER is 80 degrees with pain at end range. She also lacks strength in her R shoulder with 4-/5 R shoulder flexion, abduction, ER, and IR. Her QuickDASH decreased from 88% at her initial evaluation in December 2019 to 29.55% today. Her worst shoulder pain continues to reach a 4/10 but frequency and duration of pain has decreased. Pt provided consolidated HEP with progressions and encouraged to follow-up if she doesn't continue to improve with respect to her strength and pain. Pt will be discharged on this date.              PT Education - 07/31/18 1132    Education Details  HEP, discharge     Person(s) Educated  Patient    Methods  Explanation;Handout    Comprehension  Verbalized understanding       PT Short Term Goals - 07/29/18 1730      PT SHORT TERM GOAL #1   Title  Patient will be independent in home exercise program to improve strength/mobility for better functional independence with ADLs.    Baseline  Patient is currently following HEP per post-op protocol which will need to be updated.    Time  4    Period  Weeks    Status  Achieved    Target Date  06/26/18      PT SHORT TERM GOAL #2   Title  Patient will report a worst pain of 3/10 on VAS in  R shoulder to improve tolerance with ADLs and reduced symptoms with activities.     Baseline  04/29/18: 6/10, 05/29/18: Reports 5/10 upon arrival today but mostly minimal pain; 07/29/18: worst: 4/10;    Time  4    Period  Weeks    Status  Partially Met    Target Date  06/26/18        PT Long Term Goals - 07/29/18 1732  PT LONG TERM GOAL #1   Title  Patient will report a worst pain of 4/10 on VAS in R shoulder to improve tolerance with ADLs and reduced symptoms with activities.     Baseline  04/29/18: 6/10; 05/29/18: Reports 5/10 upon arrival today but mostly minimal pain; 07/29/18: 4/10    Time  8    Period  Weeks    Status  Achieved      PT LONG TERM GOAL #2   Title  Patient will improve shoulder AROM to > 160 degrees of flexion, scaption, and abduction for improved ability to perform overhead activities.    Baseline  04/29/2018: supine: flexion: 125 degrees with pain and guarding, abduction: 89 degrees with pain; 05/22/18: flexion: 168, Abduction: 158, ER: 72, IR: 70, 07/29/18: flexion: 161, Abduction: 150, ER: 80 IR: 70    Time  8    Period  Weeks    Status  Partially Met    Target Date  --      PT LONG TERM GOAL #3   Title  Patient will decrease Quick DASH score by > 8 points demonstrating reduced self-reported upper extremity disability.    Baseline  04/29/2018: 88% 06/10/2018 53%; 07/29/18: 29.55%    Time  8     Period  Weeks    Status  Achieved      PT LONG TERM GOAL #4   Title  Patient will demonstrate 4/5 strength in RUE to allow patient to return to work and perform job duties without compensations.     Baseline  04/29/2018: elbow flexion/extension: 2-/5, Shoulder flexion: not tested due to post op protocol; 05/29/18: Not tested due to post-op protocol; 07/29/18: R shoulder: 4-/5 R shoulder flexion, abduction, ER, and IR. Elbow and wrist 5/5 in all directions    Time  8    Period  Weeks    Status  Partially Met    Target Date  --      PT LONG TERM GOAL #5   Title  --    Baseline  --    Time  --    Period  --            Plan - 07/31/18 1205    Clinical Impression Statement  Pt reports that she is read to finish with physical therapy at this time. She saw the orthopedic PA who discharged her back to full duty at work. Her R shoulder AROM is functional but she still has some limitations. R shoulder flexion is 161 degrees, abduction is 150 degrees, and ER is 80 degrees with pain at end range. She also lacks strength in her R shoulder with 4-/5 R shoulder flexion, abduction, ER, and IR. Her QuickDASH decreased from 88% at her initial evaluation in December 2019 to 29.55% today. Her worst shoulder pain continues to reach a 4/10 but frequency and duration of pain has decreased. Pt provided consolidated HEP with progressions and encouraged to follow-up if she doesn't continue to improve with respect to her strength and pain. Pt will be discharged on this date.     Rehab Potential  Fair    Clinical Impairments Affecting Rehab Potential  (+) age, educational level (-) secondary injury     PT Frequency  One time visit    PT Duration  --   1 week   PT Treatment/Interventions  ADLs/Self Care Home Management;Aquatic Therapy;Cryotherapy;Electrical Stimulation;Parrafin;Ultrasound;Traction;Moist Heat;Iontophoresis 36m/ml Dexamethasone;Functional mobility training;Therapeutic activities;Therapeutic  exercise;Patient/family education;Neuromuscular re-education;Manual techniques;Manual lymph drainage;Compression bandaging;Taping;Energy conservation;Dry needling;Passive range of  motion;Scar mobilization    PT Next Visit Plan  Discharge    PT Home Exercise Plan  Protocol attached, scapular retractions, AAROM/AROM of R shoulder for flexion/abduction, AAROM for ER, unweighted AROM R elbow flex/ext (no lifting), passive R elbow stretching, AROM for forearm supination, as well as wrist flex/ext, isometrics for R shoudler including flexion, extension, abduction, adduction, IR, and ER. 07/29/18: R shoulder flexion, abduction, IR, ER, and rows resisted in standing with tband ([email or web address])    Consulted and Agree with Plan of Care  Patient       Patient will benefit from skilled therapeutic intervention in order to improve the following deficits and impairments:  Decreased activity tolerance, Decreased coordination, Decreased endurance, Decreased mobility, Decreased range of motion, Decreased strength, Decreased scar mobility, Hypomobility, Increased edema, Impaired flexibility, Impaired perceived functional ability, Increased muscle spasms, Impaired UE functional use, Postural dysfunction, Improper body mechanics, Pain  Visit Diagnosis: Acute pain of right shoulder - Plan: PT plan of care cert/re-cert  Muscle weakness (generalized) - Plan: PT plan of care cert/re-cert     Problem List Patient Active Problem List   Diagnosis Date Noted  . Genetic testing 03/21/2018  . Family history of breast cancer 03/06/2018  . Family history of ovarian cancer 03/06/2018  . Family history of esophageal cancer    Phillips Grout PT, DPT, GCS  Jamielynn Wigley 07/31/2018, 12:10 PM  [place] MAIN [facility] SERVICES [address] [place], [place], [address] Phone: [phone]   Fax:  [phone]  Name: Charlene Daniels MRN: [id number] Date of Birth: May 02, 1971

## 2018-07-29 NOTE — Patient Instructions (Signed)
Access Code: [email or web address]  URL: [email or web address]  Date: 07/29/2018  Prepared by: Roxana Hires   Exercises  Shoulder External Rotation with Anchored Resistance - 10 reps - 2 sets - 3 seconds hold - 1x daily - 7x weekly  Shoulder Internal Rotation with Resistance - 10 reps - 2 sets - 3 seconds hold - 1x daily - 7x weekly  Scapular Retraction with Resistance - 10 reps - 2 sets - 3 seconds hold - 1x daily - 7x weekly  Alternating Shoulder Flexion with Resistance - 10 reps - 2 sets - 3 seconds hold - 1x daily - 7x weekly  Standing Single Arm Shoulder Abduction with Resistance - 10 reps - 2 sets - 3 seconds hold - 1x daily - 7x weekly

## 2018-07-31 ENCOUNTER — Ambulatory Visit: Payer: 59

## 2018-08-05 ENCOUNTER — Ambulatory Visit: Payer: 59

## 2018-08-07 ENCOUNTER — Ambulatory Visit: Payer: 59

## 2018-08-19 MED FILL — SPIRONOLACTONE 50 MG TABS: 50 | 90 days supply | Qty: 90 | Fill #0

## 2018-09-26 DIAGNOSIS — E119 Type 2 diabetes mellitus without complications: Secondary | ICD-10-CM | POA: Diagnosis not present

## 2018-09-29 DIAGNOSIS — E119 Type 2 diabetes mellitus without complications: Secondary | ICD-10-CM | POA: Diagnosis not present

## 2018-10-10 DIAGNOSIS — E119 Type 2 diabetes mellitus without complications: Secondary | ICD-10-CM | POA: Diagnosis not present

## 2018-10-10 DIAGNOSIS — Z0000 Encounter for general adult medical examination without abnormal findings: Secondary | ICD-10-CM | POA: Diagnosis not present

## 2019-01-05 ENCOUNTER — Other Ambulatory Visit: Payer: Self-pay | Admitting: Internal Medicine

## 2019-01-05 ENCOUNTER — Ambulatory Visit
Admission: RE | Admit: 2019-01-05 | Discharge: 2019-01-05 | Disposition: A | Discharge status: Home / Self Care | Payer: 59 | Source: Ambulatory Visit | Attending: Internal Medicine | Admitting: Internal Medicine

## 2019-01-05 ENCOUNTER — Other Ambulatory Visit: Payer: Self-pay

## 2019-01-05 DIAGNOSIS — R1084 Generalized abdominal pain: Secondary | ICD-10-CM | POA: Insufficient documentation

## 2019-01-05 DIAGNOSIS — R1110 Vomiting, unspecified: Secondary | ICD-10-CM | POA: Diagnosis not present

## 2019-01-05 DIAGNOSIS — K210 Gastro-esophageal reflux disease with esophagitis: Secondary | ICD-10-CM | POA: Diagnosis not present

## 2019-01-05 DIAGNOSIS — R197 Diarrhea, unspecified: Secondary | ICD-10-CM | POA: Diagnosis not present

## 2019-01-05 MED ORDER — IOHEXOL 300 MG/ML  SOLN
100.0000 mL | Freq: Once | INTRAMUSCULAR | Status: AC | PRN
  Administered 2019-01-05: 100 mL via INTRAVENOUS

## 2019-01-07 DIAGNOSIS — A09 Infectious gastroenteritis and colitis, unspecified: Secondary | ICD-10-CM | POA: Diagnosis not present

## 2019-01-07 DIAGNOSIS — R1084 Generalized abdominal pain: Secondary | ICD-10-CM | POA: Diagnosis not present

## 2019-01-07 DIAGNOSIS — R1013 Epigastric pain: Secondary | ICD-10-CM | POA: Diagnosis not present

## 2019-01-09 DIAGNOSIS — M79671 Pain in right foot: Secondary | ICD-10-CM | POA: Diagnosis not present

## 2019-01-09 DIAGNOSIS — A09 Infectious gastroenteritis and colitis, unspecified: Secondary | ICD-10-CM | POA: Diagnosis not present

## 2019-02-25 DIAGNOSIS — L669 Cicatricial alopecia, unspecified: Secondary | ICD-10-CM | POA: Diagnosis not present

## 2019-04-10 DIAGNOSIS — E119 Type 2 diabetes mellitus without complications: Secondary | ICD-10-CM | POA: Diagnosis not present

## 2019-04-14 DIAGNOSIS — E1169 Type 2 diabetes mellitus with other specified complication: Secondary | ICD-10-CM | POA: Diagnosis not present

## 2019-04-14 DIAGNOSIS — E782 Mixed hyperlipidemia: Secondary | ICD-10-CM | POA: Diagnosis not present

## 2019-04-14 DIAGNOSIS — L659 Nonscarring hair loss, unspecified: Secondary | ICD-10-CM | POA: Diagnosis not present

## 2019-05-13 DIAGNOSIS — L650 Telogen effluvium: Secondary | ICD-10-CM | POA: Diagnosis not present

## 2019-06-04 ENCOUNTER — Other Ambulatory Visit: Payer: Self-pay | Admitting: Internal Medicine

## 2019-06-04 DIAGNOSIS — Z1231 Encounter for screening mammogram for malignant neoplasm of breast: Secondary | ICD-10-CM

## 2019-06-17 DIAGNOSIS — L650 Telogen effluvium: Secondary | ICD-10-CM | POA: Diagnosis not present

## 2019-06-17 DIAGNOSIS — L219 Seborrheic dermatitis, unspecified: Secondary | ICD-10-CM | POA: Diagnosis not present

## 2019-06-17 DIAGNOSIS — Z7689 Persons encountering health services in other specified circumstances: Secondary | ICD-10-CM | POA: Diagnosis not present

## 2019-06-17 DIAGNOSIS — L708 Other acne: Secondary | ICD-10-CM | POA: Diagnosis not present

## 2019-07-01 ENCOUNTER — Ambulatory Visit
Admission: RE | Admit: 2019-07-01 | Discharge: 2019-07-01 | Disposition: A | Discharge status: Home / Self Care | Payer: 59 | Source: Ambulatory Visit | Attending: Internal Medicine | Admitting: Internal Medicine

## 2019-07-01 DIAGNOSIS — Z1231 Encounter for screening mammogram for malignant neoplasm of breast: Secondary | ICD-10-CM | POA: Insufficient documentation

## 2019-08-17 ENCOUNTER — Other Ambulatory Visit: Payer: Self-pay | Admitting: Internal Medicine

## 2019-08-31 DIAGNOSIS — K219 Gastro-esophageal reflux disease without esophagitis: Secondary | ICD-10-CM | POA: Diagnosis not present

## 2019-08-31 DIAGNOSIS — Z8719 Personal history of other diseases of the digestive system: Secondary | ICD-10-CM | POA: Diagnosis not present

## 2019-08-31 DIAGNOSIS — R1310 Dysphagia, unspecified: Secondary | ICD-10-CM | POA: Diagnosis not present

## 2019-08-31 DIAGNOSIS — Z800 Family history of malignant neoplasm of digestive organs: Secondary | ICD-10-CM | POA: Diagnosis not present

## 2019-09-02 IMAGING — RF DG FLUORO GUIDE NDL PLC/BX
1 series · 1 of 1 positions shown · non-contrast
Comparison: none

CLINICAL DATA: Fall. Right shoulder pain. Limited range of motion.

[Series 1: cp_standard · 0.17mm/px · 1 of 1 slices shown]
[im 1/1]
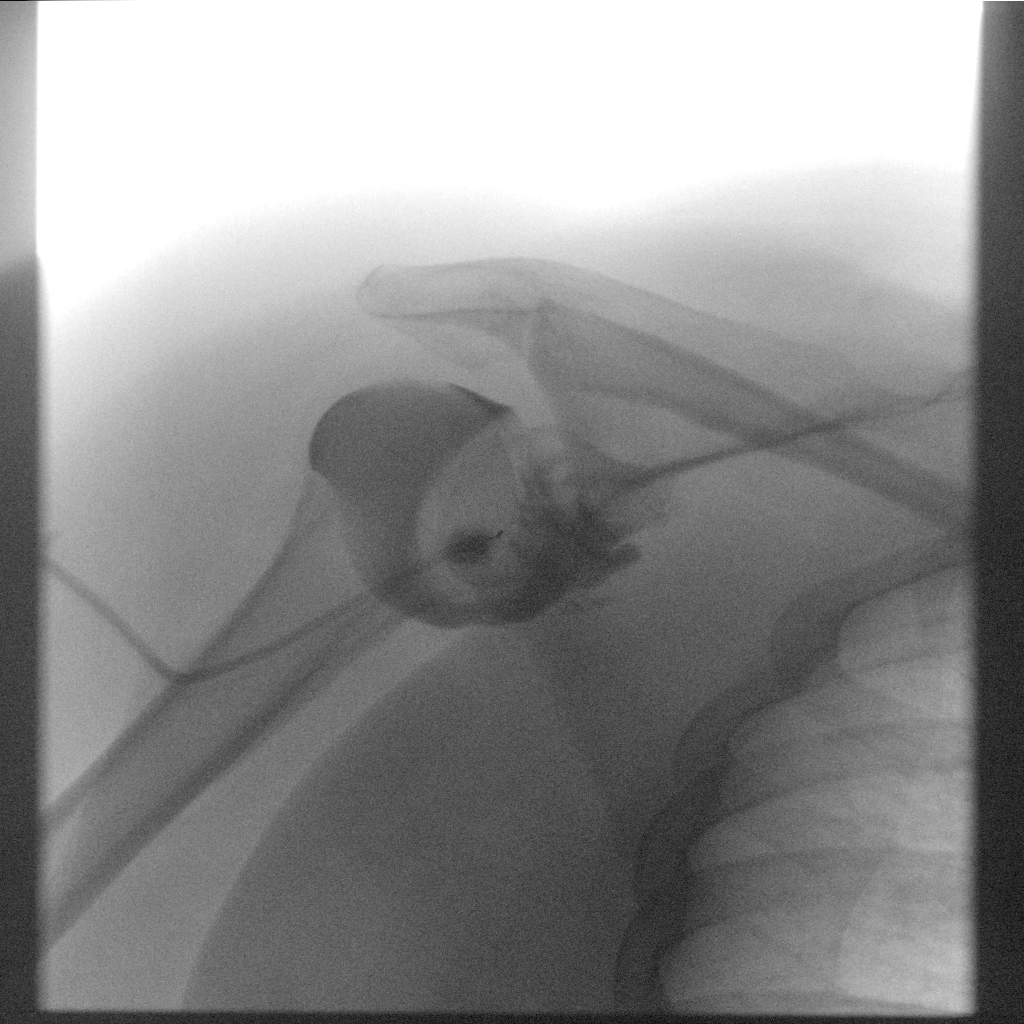

[1 of 1 positions shown; findings below may reference images not displayed]

EXAM:
RIGHT SHOULDER INJECTION UNDER FLUOROSCOPY

FLUOROSCOPY TIME:  Fluoroscopy Time:  1 minute 18 seconds

Radiation Exposure Index (if provided by the fluoroscopic device):
25.4

Number of Acquired Spot Images: 1

PROCEDURE:
After discussing risks and benefits of this procedure the patient
informed consent was obtained. Right shoulder sterilely prepped and
draped. Following local anesthesia 1% lidocaine 22 gauge spinal
needle was advanced into the right shoulder joint with fluoroscopic
guidance. This followed by injection of a standardized mixture of
Isovue 200, 1% lidocaine, and gadolinium. There no complications.
Needle was removed and hemostasis achieved. Patient sent to MRI in
good condition.
IMPRESSION: Successful right shoulder injection for MRI arthrography.

## 2019-09-15 ENCOUNTER — Other Ambulatory Visit: Payer: Self-pay | Admitting: Internal Medicine

## 2020-01-14 ENCOUNTER — Other Ambulatory Visit: Payer: Self-pay

## 2020-01-14 ENCOUNTER — Other Ambulatory Visit
Admission: RE | Admit: 2020-01-14 | Discharge: 2020-01-14 | Disposition: A | Discharge status: Home / Self Care | Payer: 59 | Source: Ambulatory Visit | Attending: Internal Medicine | Admitting: Internal Medicine

## 2020-01-14 DIAGNOSIS — Z01812 Encounter for preprocedural laboratory examination: Secondary | ICD-10-CM | POA: Diagnosis not present

## 2020-01-14 DIAGNOSIS — Z20822 Contact with and (suspected) exposure to covid-19: Secondary | ICD-10-CM | POA: Insufficient documentation

## 2020-01-14 LAB — SARS CORONAVIRUS 2 (TAT 6-24 HRS): SARS Coronavirus 2: NEGATIVE

## 2020-01-15 ENCOUNTER — Encounter: Payer: Self-pay | Admitting: Internal Medicine

## 2020-01-18 ENCOUNTER — Encounter: Payer: Self-pay | Admitting: Internal Medicine

## 2020-01-18 ENCOUNTER — Ambulatory Visit: Payer: 59 | Admitting: Certified Registered Nurse Anesthetist

## 2020-01-18 ENCOUNTER — Ambulatory Visit
Admission: RE | Admit: 2020-01-18 | Discharge: 2020-01-18 | Disposition: A | Discharge status: Home / Self Care | Payer: 59 | Attending: Internal Medicine | Admitting: Internal Medicine

## 2020-01-18 ENCOUNTER — Encounter
Admission: RE | Disposition: A | Discharge status: Home / Self Care | Payer: Self-pay | Source: Home / Self Care | Attending: Internal Medicine

## 2020-01-18 DIAGNOSIS — E1122 Type 2 diabetes mellitus with diabetic chronic kidney disease: Secondary | ICD-10-CM | POA: Insufficient documentation

## 2020-01-18 DIAGNOSIS — K5732 Diverticulitis of large intestine without perforation or abscess without bleeding: Secondary | ICD-10-CM | POA: Diagnosis not present

## 2020-01-18 DIAGNOSIS — Z7984 Long term (current) use of oral hypoglycemic drugs: Secondary | ICD-10-CM | POA: Insufficient documentation

## 2020-01-18 DIAGNOSIS — Z7982 Long term (current) use of aspirin: Secondary | ICD-10-CM | POA: Insufficient documentation

## 2020-01-18 DIAGNOSIS — Z79899 Other long term (current) drug therapy: Secondary | ICD-10-CM | POA: Diagnosis not present

## 2020-01-18 DIAGNOSIS — R1310 Dysphagia, unspecified: Secondary | ICD-10-CM | POA: Diagnosis present

## 2020-01-18 DIAGNOSIS — K449 Diaphragmatic hernia without obstruction or gangrene: Secondary | ICD-10-CM | POA: Diagnosis not present

## 2020-01-18 DIAGNOSIS — E785 Hyperlipidemia, unspecified: Secondary | ICD-10-CM | POA: Diagnosis not present

## 2020-01-18 DIAGNOSIS — K2100 Gastro-esophageal reflux disease with esophagitis, without bleeding: Secondary | ICD-10-CM | POA: Diagnosis not present

## 2020-01-18 DIAGNOSIS — N189 Chronic kidney disease, unspecified: Secondary | ICD-10-CM | POA: Insufficient documentation

## 2020-01-18 DIAGNOSIS — K222 Esophageal obstruction: Secondary | ICD-10-CM | POA: Diagnosis not present

## 2020-01-18 DIAGNOSIS — Z09 Encounter for follow-up examination after completed treatment for conditions other than malignant neoplasm: Secondary | ICD-10-CM | POA: Insufficient documentation

## 2020-01-18 DIAGNOSIS — I10 Essential (primary) hypertension: Secondary | ICD-10-CM | POA: Insufficient documentation

## 2020-01-18 DIAGNOSIS — K640 First degree hemorrhoids: Secondary | ICD-10-CM | POA: Insufficient documentation

## 2020-01-18 DIAGNOSIS — M1990 Unspecified osteoarthritis, unspecified site: Secondary | ICD-10-CM | POA: Diagnosis not present

## 2020-01-18 DIAGNOSIS — K228 Other specified diseases of esophagus: Secondary | ICD-10-CM | POA: Diagnosis not present

## 2020-01-18 HISTORY — DX: Polycystic ovarian syndrome: E28.2

## 2020-01-18 HISTORY — DX: Enterocolitis due to Clostridium difficile, not specified as recurrent: A04.72

## 2020-01-18 HISTORY — PX: ESOPHAGOGASTRODUODENOSCOPY: SHX5428

## 2020-01-18 HISTORY — DX: Other intervertebral disc degeneration, lumbar region without mention of lumbar back pain or lower extremity pain: M51.369

## 2020-01-18 HISTORY — PX: COLONOSCOPY WITH PROPOFOL: SHX5780

## 2020-01-18 HISTORY — DX: Diverticulitis of intestine, part unspecified, without perforation or abscess without bleeding: K57.92

## 2020-01-18 HISTORY — DX: Other intervertebral disc degeneration, lumbar region: M51.36

## 2020-01-18 HISTORY — DX: Hyperlipidemia, unspecified: E78.5

## 2020-01-18 LAB — GLUCOSE, CAPILLARY: Glucose-Capillary: 172 mg/dL — ABNORMAL HIGH (ref 70–99)

## 2020-01-18 SURGERY — EGD (ESOPHAGOGASTRODUODENOSCOPY)
Anesthesia: General

## 2020-01-18 MED ORDER — PROPOFOL 10 MG/ML IV BOLUS
INTRAVENOUS | Status: DC | PRN
  Administered 2020-01-18: 90 mg via INTRAVENOUS

## 2020-01-18 MED ORDER — MIDAZOLAM HCL 2 MG/2ML IJ SOLN
INTRAMUSCULAR | Status: AC
  Filled 2020-01-18: qty 2

## 2020-01-18 MED ORDER — PROPOFOL 500 MG/50ML IV EMUL
INTRAVENOUS | Status: AC
  Filled 2020-01-18: qty 50

## 2020-01-18 MED ORDER — SODIUM CHLORIDE 0.9 % IV SOLN
INTRAVENOUS | Status: DC
  Administered 2020-01-18: 1000 mL via INTRAVENOUS

## 2020-01-18 MED ORDER — LIDOCAINE HCL (CARDIAC) PF 100 MG/5ML IV SOSY
PREFILLED_SYRINGE | INTRAVENOUS | Status: DC | PRN
  Administered 2020-01-18: 50 mg via INTRAVENOUS
  Administered 2020-01-18: 24 mg via INTRAVENOUS

## 2020-01-18 MED ORDER — LIDOCAINE HCL (PF) 2 % IJ SOLN
INTRAMUSCULAR | Status: AC
  Filled 2020-01-18: qty 5

## 2020-01-18 MED ORDER — PROPOFOL 500 MG/50ML IV EMUL
INTRAVENOUS | Status: DC | PRN
  Administered 2020-01-18: 135 ug/kg/min via INTRAVENOUS

## 2020-01-18 MED ORDER — MIDAZOLAM HCL 2 MG/2ML IJ SOLN
INTRAMUSCULAR | Status: DC | PRN
  Administered 2020-01-18: 2 mg via INTRAVENOUS

## 2020-01-18 MED ORDER — PROPOFOL 10 MG/ML IV BOLUS
INTRAVENOUS | Status: AC
  Filled 2020-01-18: qty 20

## 2020-01-18 NOTE — Interval H&P Note (Signed)
History and Physical Interval Note:  01/18/2020 8:24 AM  Charlene Daniels  has presented today for surgery, with the diagnosis of GERD,DYSPHAGIA,HX.OF DIVERTICULITIS.  The various methods of treatment have been discussed with the patient and family. After consideration of risks, benefits and other options for treatment, the patient has consented to  Procedure(s): ESOPHAGOGASTRODUODENOSCOPY (EGD) (N/A) COLONOSCOPY WITH PROPOFOL (N/A) as a surgical intervention.  The patient's history has been reviewed, patient examined, no change in status, stable for surgery.  I have reviewed the patient's chart and labs.  Questions were answered to the patient's satisfaction.     [place], [place]

## 2020-01-18 NOTE — Op Note (Addendum)
[facility] Gastroenterology Patient Name: Charlene Daniels Procedure Date: 01/18/2020 8:36 AM MRN: [id number] Account #: [id number] Date of Birth: [birth year] Admit Type: Outpatient Age: 49 Room: [facility] ENDO ROOM 3 Gender: Female Note Status: Finalized Procedure:             Upper GI endoscopy Indications:           Screening for Barrett's esophagus, Dysphagia,                         Gastro-esophageal reflux disease Providers:             [name]. Alann Avey MD, MD Medicines:             Propofol per Anesthesia Complications:         No immediate complications. Procedure:             Pre-Anesthesia Assessment:                        - The risks and benefits of the procedure and the                         sedation options and risks were discussed with the                         patient. All questions were answered and informed                         consent was obtained.                        - Patient identification and proposed procedure were                         verified prior to the procedure by the physician and                         the nurse. The procedure was verified in the procedure                         room.                        - ASA Grade Assessment: III - A patient with severe                         systemic disease.                        - After reviewing the risks and benefits, the patient                         was deemed in satisfactory condition to undergo the                         procedure.                        After obtaining informed consent, the endoscope was  passed under direct vision. Throughout the procedure,                         the patient's blood pressure, pulse, and oxygen                         saturations were monitored continuously. The Endoscope                         was introduced through the mouth, and advanced to the                         third part of duodenum. The upper GI  endoscopy was                         accomplished without difficulty. The patient tolerated                         the procedure well. Findings:      Segmental mild mucosal variance characterized by longitudinal markings       and a decreased vascular pattern was found in the middle third of the       esophagus and in the lower third of the esophagus. Biopsies were       obtained from the proximal and distal esophagus with cold forceps for       histology of suspected eosinophilic esophagitis.      One benign-appearing, intrinsic mild stenosis was found at the       gastroesophageal junction. This stenosis measured 1.5 cm (inner       diameter) x less than one cm (in length). The stenosis was traversed.       The scope was withdrawn. Dilation was performed with a Maloney dilator       with no resistance at 53 Fr.      A 1 cm hiatal hernia was present.      Diffuse mildly erythematous mucosa without bleeding was found in the       entire examined stomach.      The examined duodenum was normal.      The exam was otherwise without abnormality. Impression:            - Esophageal mucosal variant. Biopsied.                        - Benign-appearing esophageal stenosis. Dilated.                        - 1 cm hiatal hernia.                        - Erythematous mucosa in the stomach.                        - Normal examined duodenum.                        - The examination was otherwise normal. Recommendation:        - Await pathology results.                        - Monitor results to esophageal  dilation                        - Proceed with colonoscopy Procedure Code(s):     --- Professional ---                        [id number], Esophagogastroduodenoscopy, flexible,                         transoral; with biopsy, single or multiple                        43450, Dilation of esophagus, by unguided sound or                         bougie, single or multiple passes Diagnosis Code(s):     ---  Professional ---                        K21.9, Gastro-esophageal reflux disease without                         esophagitis                        R13.10, Dysphagia, unspecified                        Z13.810, Encounter for screening for upper                         gastrointestinal disorder                        K31.89, Other diseases of stomach and duodenum                        K44.9, Diaphragmatic hernia without obstruction or                         gangrene                        K22.2, Esophageal obstruction                        K22.8, Other specified diseases of esophagus CPT copyright 2019 American Medical Association. All rights reserved. The codes documented in this report are preliminary and upon coder review may  be revised to meet current compliance requirements. Efrain Sella MD, MD 01/18/2020 8:47:51 AM This report has been signed electronically. Number of Addenda: 0 Note Initiated On: 01/18/2020 8:36 AM Estimated Blood Loss:  Estimated blood loss: none.      [facility]

## 2020-01-18 NOTE — Transfer of Care (Signed)
Immediate Anesthesia Transfer of Care Note  Patient: Charlene Daniels  Procedure(s) Performed: ESOPHAGOGASTRODUODENOSCOPY (EGD) (N/A ) COLONOSCOPY WITH PROPOFOL (N/A )  Patient Location: PACU and Endoscopy Unit  Anesthesia Type:General  Level of Consciousness: drowsy  Airway & Oxygen Therapy: Patient Spontanous Breathing  Post-op Assessment: Report given to RN and Post -op Vital signs reviewed and stable  Post vital signs: Reviewed and stable  Last Vitals:  Vitals Value Taken Time  BP 121/65 01/18/20 0908  Temp 36.1 C 01/18/20 0908  Pulse 66 01/18/20 0908  Resp 16 01/18/20 0908  SpO2 93 % 01/18/20 0908  Vitals shown include unvalidated device data.  Last Pain:  Vitals:   01/18/20 0908  TempSrc: Tympanic  PainSc: Asleep      Patients Stated Pain Goal: 0 (80/99/83 3825)  Complications: No complications documented.

## 2020-01-18 NOTE — Op Note (Addendum)
[facility] Gastroenterology Patient Name: Charlene Daniels Procedure Date: 01/18/2020 8:35 AM MRN: [id number] Account #: [id number] Date of Birth: [birth year] Admit Type: Outpatient Age: 49 Room: [facility] ENDO ROOM 3 Gender: Female Note Status: Finalized Procedure:             Colonoscopy Indications:           Follow-up of diverticulitis Providers:             [name]. Dontrail Blackwell MD, MD Medicines:             Propofol per Anesthesia Complications:         No immediate complications. Procedure:             Pre-Anesthesia Assessment:                        - The risks and benefits of the procedure and the                         sedation options and risks were discussed with the                         patient. All questions were answered and informed                         consent was obtained.                        - Patient identification and proposed procedure were                         verified prior to the procedure by the nurse. The                         procedure was verified in the procedure room.                        - ASA Grade Assessment: III - A patient with severe                         systemic disease.                        - After reviewing the risks and benefits, the patient                         was deemed in satisfactory condition to undergo the                         procedure.                        After obtaining informed consent, the colonoscope was                         passed under direct vision. Throughout the procedure,                         the patient's blood pressure, pulse, and oxygen  saturations were monitored continuously. The                         Colonoscope was introduced through the anus and                         advanced to the the cecum, identified by appendiceal                         orifice and ileocecal valve. Findings:      The perianal and digital rectal examinations were normal.  Pertinent       negatives include normal sphincter tone and no palpable rectal lesions.      Non-bleeding internal hemorrhoids were found during retroflexion. The       hemorrhoids were Grade I (internal hemorrhoids that do not prolapse).      Multiple small and large-mouthed diverticula were found in the sigmoid       colon.      The exam was otherwise without abnormality. Impression:            - Non-bleeding internal hemorrhoids.                        - Diverticulosis in the sigmoid colon.                        - The examination was otherwise normal.                        - No specimens collected. Recommendation:        - Patient has a contact number available for                         emergencies. The signs and symptoms of potential                         delayed complications were discussed with the patient.                         Return to normal activities tomorrow. Written                         discharge instructions were provided to the patient.                        - Resume previous diet.                        - Continue present medications.                        - Repeat colonoscopy in 10 years for screening                         purposes.                        - Return to GI office in 3 months.                        - Follow up with Octavia Bruckner,  PA-C in [ ]  months.                        - The findings and recommendations were discussed with                         the patient. Procedure Code(s):     --- Professional ---                        [phone], Colonoscopy, flexible; diagnostic, including                         collection of specimen(s) by brushing or washing, when                         performed (separate procedure) Diagnosis Code(s):     --- Professional ---                        K57.30, Diverticulosis of large intestine without                         perforation or abscess without bleeding                        K57.32, Diverticulitis of  large intestine without                         perforation or abscess without bleeding                        K64.0, First degree hemorrhoids CPT copyright 2019 American Medical Association. All rights reserved. The codes documented in this report are preliminary and upon coder review may  be revised to meet current compliance requirements. Efrain Sella MD, MD 01/18/2020 9:08:05 AM This report has been signed electronically. Number of Addenda: 0 Note Initiated On: 01/18/2020 8:35 AM Scope Withdrawal Time: 0 hours 5 minutes 56 seconds  Total Procedure Duration: 0 hours 14 minutes 17 seconds  Estimated Blood Loss:  Estimated blood loss: none.      [facility]

## 2020-01-18 NOTE — H&P (Signed)
Outpatient short stay form Pre-procedure 01/18/2020 8:22 AM Charlene Daniels, M.D.  Primary Physician: Charlene Daniels, M.D.  Reason for visit:  GERD, Dysphagia, change in bowel habits, f/u Diverticulitis  History of present illness:  Patient is a pleasant 49 y/o female with a personal history of diverticular disease of the large intestine s/p diverticulitis x 8 in the past. Has some 3-5 loose bm's per day after colonoscopy. Has chronic GERD with intermittent solid food dysphagia to the mid-sternum. Has family history of achalasia in her brother. No hemetemesis, abdominal pain.     Current Facility-Administered Medications:  .  0.9 %  sodium chloride infusion, , Intravenous, Continuous, Charlene Daniels, Charlene Pike, MD  Medications Prior to Admission  Medication Sig Dispense Refill Last Dose  . acetaminophen (TYLENOL) 500 MG tablet Take 1,000 mg by mouth 2 (two) times daily as needed (for pain.).   01/17/2020 at Unknown time  . Ascorbic Acid (VITAMIN C PO) Take 1 tablet by mouth at bedtime.   Past Week at Unknown time  . aspirin EC 81 MG tablet Take 81 mg by mouth at bedtime.   Past Week at Unknown time  . calcium carbonate (TUMS EX) 750 MG chewable tablet Chew 1 tablet by mouth as needed for heartburn.   Past Week at Unknown time  . cholecalciferol (VITAMIN D) 1000 units tablet Take 1,000 Units by mouth at bedtime.   01/17/2020 at Unknown time  . empagliflozin (JARDIANCE) 25 MG TABS tablet Take by mouth daily.   01/17/2020 at Unknown time  . ergocalciferol (VITAMIN D2) 1.25 MG (50000 UT) capsule Take 50,000 Units by mouth once a week.   01/17/2020 at Unknown time  . estradiol (ESTRACE) 2 MG tablet Take 1 mg by mouth at bedtime.  99 01/17/2020 at Unknown time  . Garlic 4944 MG CAPS Take 1,000 mg by mouth at bedtime.   01/17/2020 at Unknown time  . ibuprofen (ADVIL,MOTRIN) 200 MG tablet Take 800 mg by mouth 2 (two) times daily as needed (for pain.).   Past Week at Unknown time  . KRILL OIL PO Take 1 capsule by  mouth at bedtime.   Past Week at Unknown time  . lisinopril-hydrochlorothiazide (PRINZIDE,ZESTORETIC) 20-12.5 MG per tablet Take 0.5 tablets by mouth at bedtime.    01/17/2020 at Unknown time  . metFORMIN (GLUCOPHAGE) 1000 MG tablet Take 1,000 mg by mouth 2 (two) times daily with a meal.   01/17/2020 at Unknown time  . pantoprazole (PROTONIX) 40 MG tablet Take 40 mg by mouth at bedtime.    01/17/2020 at Unknown time  . Probiotic Product (PROBIOTIC PO) Take 1 capsule by mouth at bedtime.   01/17/2020 at Unknown time  . rosuvastatin (CRESTOR) 5 MG tablet Take 5 mg by mouth daily.   01/17/2020 at Unknown time  . spironolactone (ALDACTONE) 50 MG tablet Take 50 mg by mouth at bedtime.    01/17/2020 at Unknown time  . vitamin B-12 (CYANOCOBALAMIN) 1000 MCG tablet Take 1,000 mcg by mouth at bedtime.   01/17/2020 at Unknown time  . Dulaglutide (TRULICITY) 9.67 RF/1.6BW SOPN Inject 0.75 mg into the skin once a week. (Patient not taking: Reported on 01/18/2020)   Completed Course at Unknown time  . glimepiride (AMARYL) 2 MG tablet Take 2 mg by mouth daily at 12 noon.  (Patient not taking: Reported on 01/18/2020)  98 Completed Course at Unknown time  . OVER THE COUNTER MEDICATION Take 5 drops by mouth 2 (two) times daily. CBD OIL     .  oxyCODONE (OXY IR/ROXICODONE) 5 MG immediate release tablet  (Patient not taking: Reported on 01/18/2020)  0 Not Taking at Unknown time     Allergies  Allergen Reactions  . Darvocet [Propoxyphene N-Acetaminophen] Nausea Only and Other (See Comments)    headache  . Ultram [Tramadol] Other (See Comments)    hallucinations  . Sulfa Antibiotics Rash    Rash on wrist     Past Medical History:  Diagnosis Date  . Arthritis   . Chronic kidney disease    on meds to protect kidney from DM  . Colitis, Clostridium difficile   . DDD (degenerative disc disease), lumbar   . Diabetes mellitus without complication ([place])   . Diverticulitis   . Family history of breast cancer 03/06/2018   . Family history of esophageal cancer   . Family history of ovarian cancer 03/06/2018  . GERD (gastroesophageal reflux disease)   . Headache(784.0)   . Hyperlipidemia   . Hypertension   . PCOS (polycystic ovarian syndrome)   . PONV (postoperative nausea and vomiting)     Review of systems:  Otherwise negative.    Physical Exam  Gen: Alert, oriented. Appears stated age.  HEENT: Charlene Daniels/AT. PERRLA. Lungs: CTA, no wheezes. CV: RR nl S1, S2. Abd: soft, benign, no masses. BS+ Ext: No edema. Pulses 2+    Planned procedures: Proceed with EGD and colonoscopy. The patient understands the nature of the planned procedure, indications, risks, alternatives and potential complications including but not limited to bleeding, infection, perforation, damage to internal organs and possible oversedation/side effects from anesthesia. The patient agrees and gives consent to proceed.  Please refer to procedure notes for findings, recommendations and patient disposition/instructions.     Charlene Daniels K. Alice Daniels, M.D. Gastroenterology 01/18/2020  8:22 AM

## 2020-01-18 NOTE — Anesthesia Preprocedure Evaluation (Signed)
Anesthesia Evaluation  Patient identified by MRN, date of birth, ID band Patient awake    Reviewed: Allergy & Precautions, H&P , NPO status , Patient's Chart, lab work & pertinent test results  History of Anesthesia Complications (+) PONV and history of anesthetic complications  Airway Mallampati: III  TM Distance: <3 FB Neck ROM: limited    Dental  (+) Chipped, Missing   Pulmonary neg pulmonary ROS, neg shortness of breath,    Pulmonary exam normal        Cardiovascular Exercise Tolerance: Good hypertension, (-) angina(-) Past MI Normal cardiovascular exam     Neuro/Psych  Headaches, negative psych ROS   GI/Hepatic Neg liver ROS, GERD  Controlled,  Endo/Other  diabetes, Type 2  Renal/GU Renal disease  negative genitourinary   Musculoskeletal  (+) Arthritis ,   Abdominal   Peds  Hematology negative hematology ROS (+)   Anesthesia Other Findings Past Medical History: No date: Arthritis No date: Chronic kidney disease     Comment:  on meds to protect kidney from DM No date: Colitis, Clostridium difficile No date: DDD (degenerative disc disease), lumbar No date: Diabetes mellitus without complication (HCC) No date: Diverticulitis 03/06/2018: Family history of breast cancer No date: Family history of esophageal cancer 03/06/2018: Family history of ovarian cancer No date: GERD (gastroesophageal reflux disease) No date: Headache(784.0) No date: Hyperlipidemia No date: Hypertension No date: PCOS (polycystic ovarian syndrome) No date: PONV (postoperative nausea and vomiting)  Past Surgical History: 2009: ABDOMINAL HYSTERECTOMY 1994: CHOLECYSTECTOMY 1994: DILATION AND CURETTAGE OF UTERUS 1999: LYMPH NODE DISSECTION; Right     Comment:  right neck cervical LN dissection after Cat-scratch               fever 09/19/2017: SHOULDER ARTHROSCOPY WITH OPEN ROTATOR CUFF REPAIR; Right     Comment:  Procedure: SHOULDER  ARTHROSCOPY WITH OPEN ROTATOR CUFF               REPAIR & SUBACROMINAL DECOMPRESSION.;  Surgeon:               Thornton Park, MD;  Location: [facility] ORS;  Service:               Orthopedics;  Laterality: Right; 02/12/2013: SHOULDER ARTHROSCOPY WITH ROTATOR CUFF REPAIR AND  SUBACROMIAL DECOMPRESSION; Left     Comment:  Procedure: LEFT SHOULDER ARTHROSCOPY WITH SUBACROMIAL               DECOMPRESSION, DISTAL CLAVICLE RESECTION, POSSIBLE BICEP               TENOTOMY, POSSIBLE ROTATOR CUFF REPAIR;  Surgeon: Marin Shutter, MD;  Location: [facility];  Service: Orthopedics;                Laterality: Left; 1998: TONSILLECTOMY  BMI    Body Mass Index: 43.29 kg/m      Reproductive/Obstetrics negative OB ROS                             Anesthesia Physical Anesthesia Plan  ASA: III  Anesthesia Plan: General   Post-op Pain Management:    Induction: Intravenous  PONV Risk Score and Plan: Propofol infusion and TIVA  Airway Management Planned: Natural Airway and Nasal Cannula  Additional Equipment:   Intra-op Plan:   Post-operative Plan:   Informed Consent: I have reviewed the patients History and Physical,  chart, labs and discussed the procedure including the risks, benefits and alternatives for the proposed anesthesia with the patient or authorized representative who has indicated his/her understanding and acceptance.     Dental Advisory Given  Plan Discussed with: Anesthesiologist, CRNA and Surgeon  Anesthesia Plan Comments: (Patient consented for risks of anesthesia including but not limited to:  - adverse reactions to medications - risk of intubation if required - damage to eyes, teeth, lips or other oral mucosa - nerve damage due to positioning  - sore throat or hoarseness - Damage to heart, brain, nerves, lungs, other parts of body or loss of life  Patient voiced understanding.)        Anesthesia Quick Evaluation

## 2020-01-18 NOTE — Anesthesia Postprocedure Evaluation (Signed)
Anesthesia Post Note  Patient: Charlene Daniels  Procedure(s) Performed: ESOPHAGOGASTRODUODENOSCOPY (EGD) (N/A ) COLONOSCOPY WITH PROPOFOL (N/A )  Patient location during evaluation: Endoscopy Anesthesia Type: General Level of consciousness: awake and alert Pain management: pain level controlled Vital Signs Assessment: post-procedure vital signs reviewed and stable Respiratory status: spontaneous breathing, nonlabored ventilation, respiratory function stable and patient connected to nasal cannula oxygen Cardiovascular status: blood pressure returned to baseline and stable Postop Assessment: no apparent nausea or vomiting Anesthetic complications: no   No complications documented.   Last Vitals:  Vitals:   01/18/20 0928 01/18/20 0938  BP: 137/76 (!) 143/80  Pulse: 69 65  Resp: 15 19  Temp:    SpO2: 96% 98%    Last Pain:  Vitals:   01/18/20 0938  TempSrc:   PainSc: 0-No pain                 [name]

## 2020-01-19 ENCOUNTER — Encounter: Payer: Self-pay | Admitting: Internal Medicine

## 2020-01-19 LAB — SURGICAL PATHOLOGY

## 2020-04-13 IMAGING — MR MR HUMERUS*R* W/O CM
5 series · 40 of 40 positions shown · non-contrast
Comparison: None.

CLINICAL DATA: Previous rotator cuff repair. Decreased range of
motion. Bicipital pain.

EXAM:
MRI OF THE RIGHT HUMERUS WITHOUT CONTRAST
TECHNIQUE: Multiplanar, multisequence MR imaging of the right humerus was
performed. No intravenous contrast was administered.

[Series 6: T1 · axial · 5.0mm · 0.62mm/px · z∈[-66,+207]mm · 8 of 40 slices shown (1 of 3)]
[im 1/40]
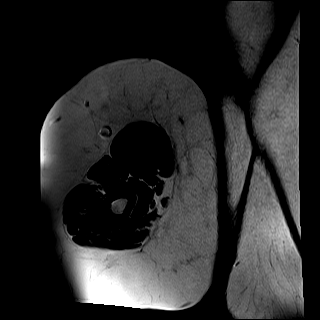
[im 6/40]
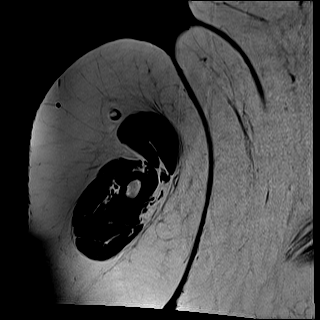
[im 12/40]
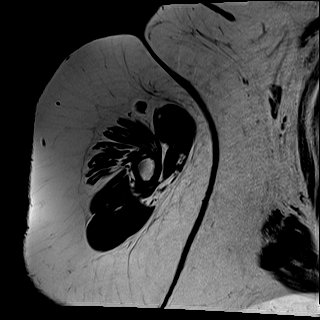
[im 17/40]
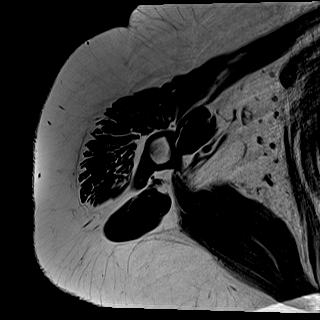
[im 23/40]
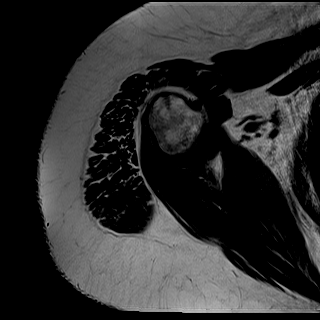
[im 28/40]
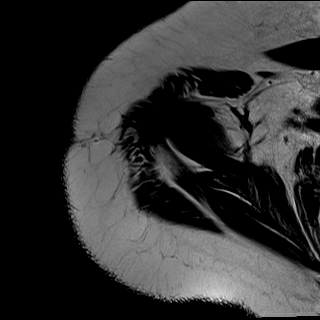
[im 34/40]
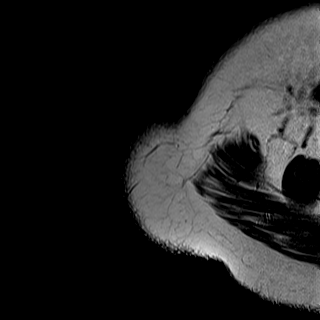
[im 40/40]
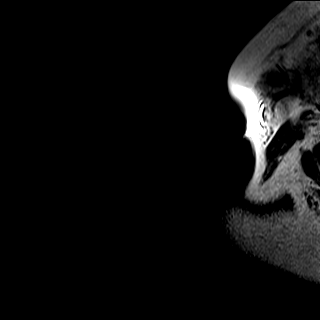

[Series 7: STIR · axial · 5.0mm · 0.62mm/px · z∈[-66,+207]mm · 8 of 40 slices shown (1 of 2)]
[im 1/40]
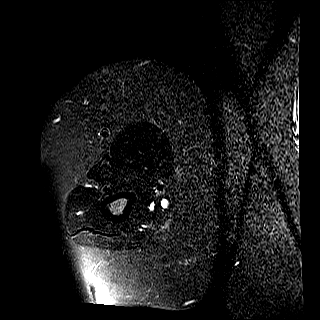
[im 6/40]
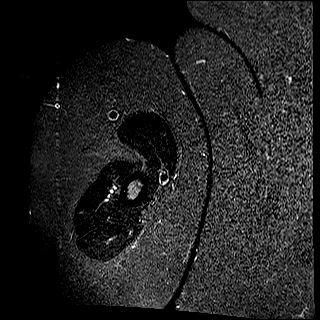
[im 12/40]
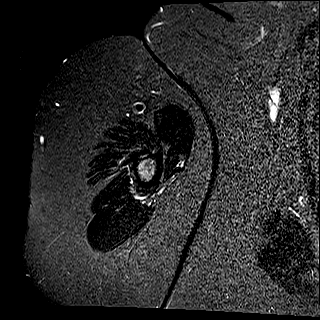
[im 17/40]
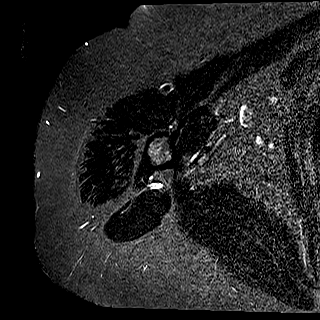
[im 23/40]
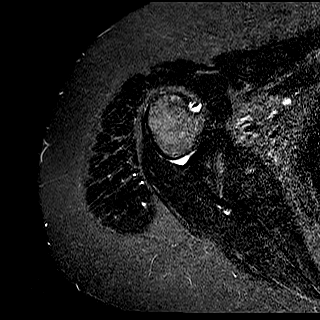
[im 28/40]
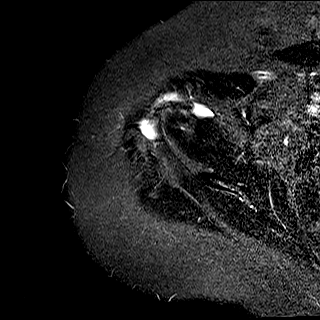
[im 34/40]
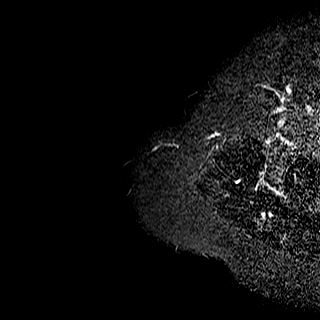
[im 40/40]
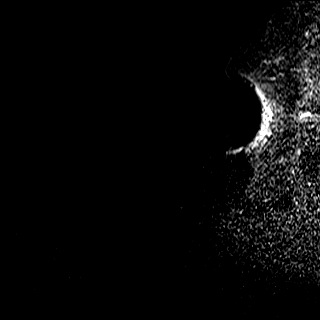

[Series 9: T1 · oblique · 4.0mm · 0.94mm/px · 8 of 35 slices shown (2 of 3)]
[im 1/35]
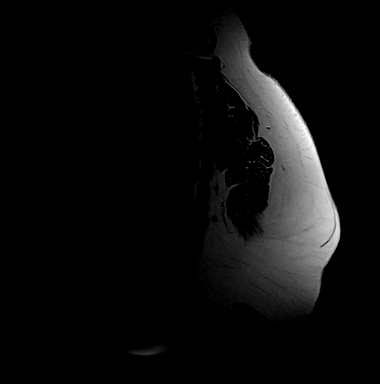
[im 5/35]
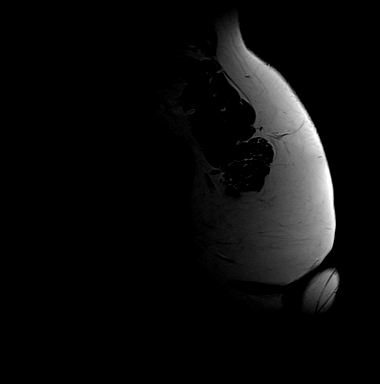
[im 10/35]
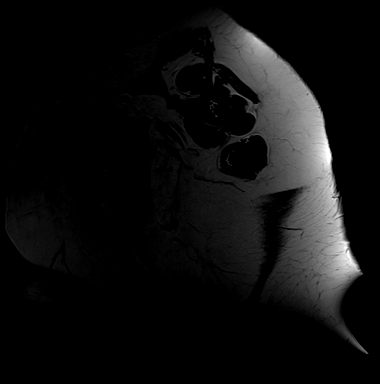
[im 15/35]
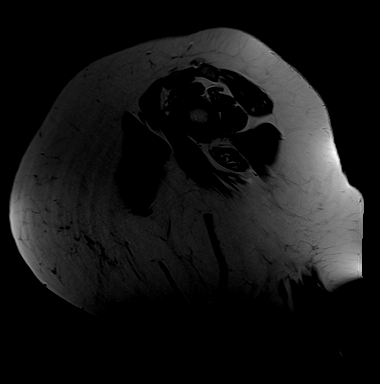
[im 20/35]
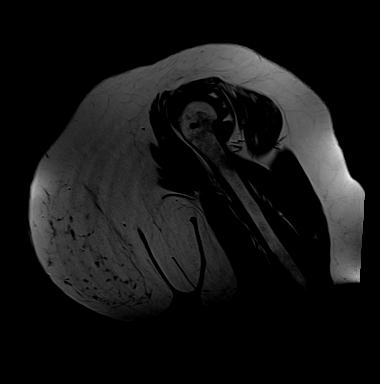
[im 25/35]
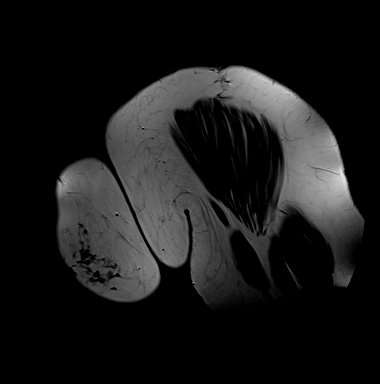
[im 30/35]
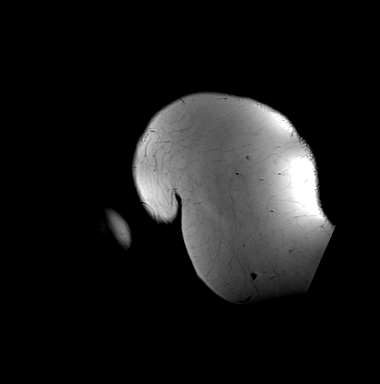
[im 35/35]
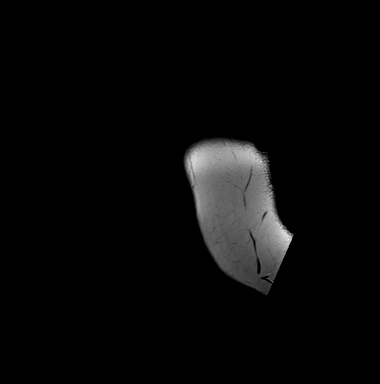

[Series 10: T1 · oblique · 4.0mm · 0.94mm/px · 8 of 35 slices shown (3 of 3)]
[im 1/35]
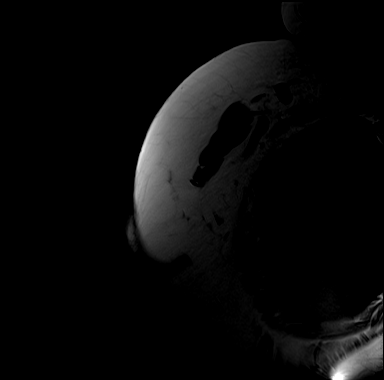
[im 5/35]
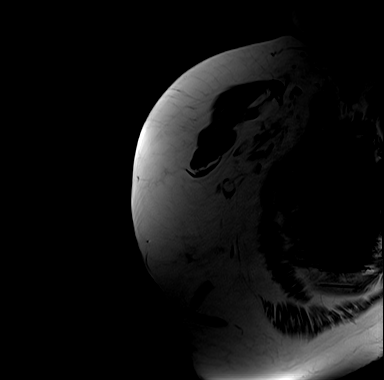
[im 10/35]
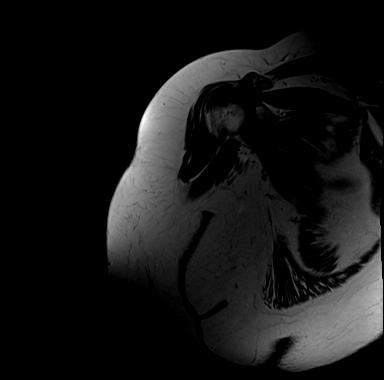
[im 15/35]
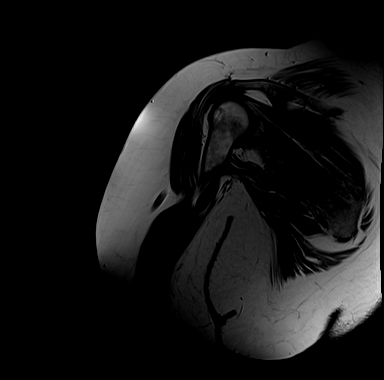
[im 20/35]
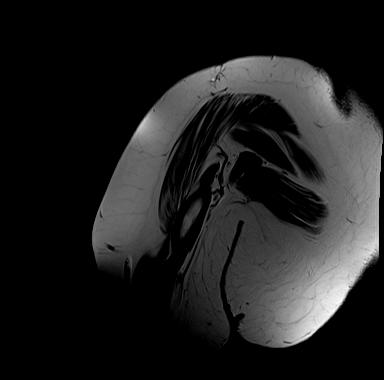
[im 25/35]
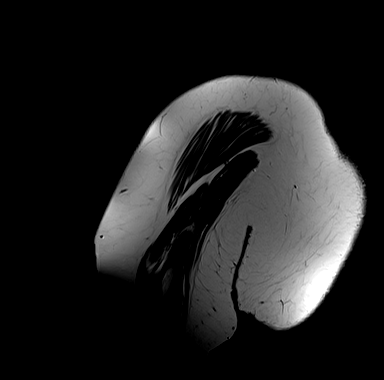
[im 30/35]
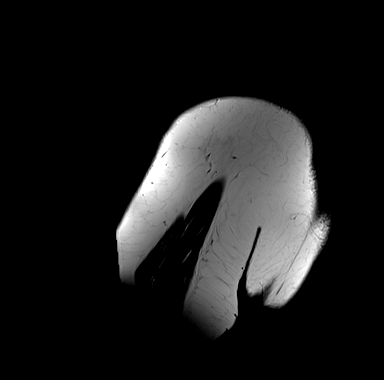
[im 35/35]
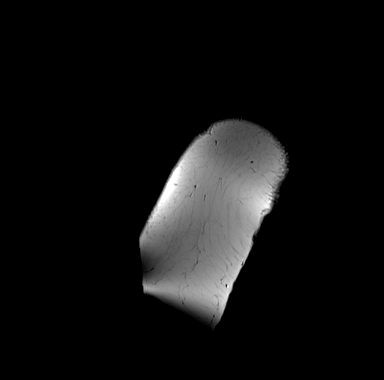

[Series 11: STIR · oblique · 4.0mm · 1.41mm/px · 8 of 35 slices shown (2 of 2)]
[im 1/35]
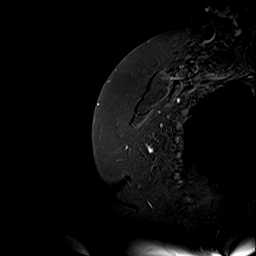
[im 5/35]
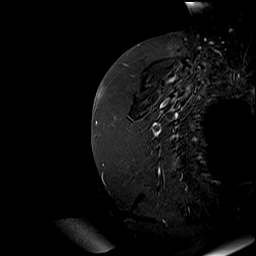
[im 10/35]
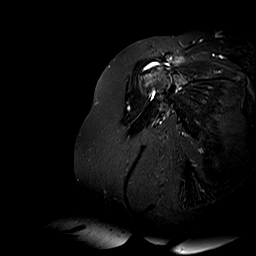
[im 15/35]
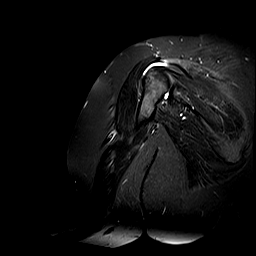
[im 20/35]
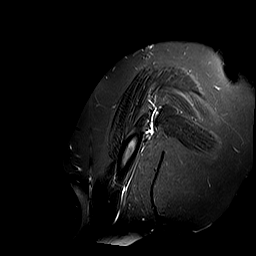
[im 25/35]
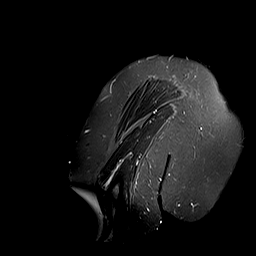
[im 30/35]
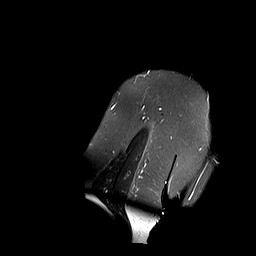
[im 35/35]
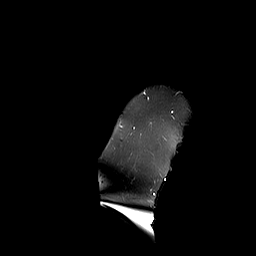

[40 of 40 positions shown; findings below may reference images not displayed]

FINDINGS: Bones/Joint/Cartilage

Postsurgical changes in the superior anterior femoral head. No
fracture or dislocation. Normal alignment. No joint effusion. No
aggressive osseous lesion. No periosteal reaction or bone
destruction. Moderate arthropathy of the acromioclavicular joint.
Mild partial-thickness cartilage loss of the glenohumeral joint.

Ligaments, muscles and Tendons

Mild tendinosis of supraspinatus tendon. No discrete rotator cuff
tear. Short and long head of the biceps tendons are intact. Muscles
are normal. No muscle atrophy or muscle edema. No intramuscular
fluid collection or hematoma.

Soft tissue
Moderate amount of fluid in the subacromial/subdeltoid bursa as can
be seen with bursitis. No soft tissue mass.
IMPRESSION: 1. Moderate subacromial/subdeltoid bursitis.
2. Mild tendinosis of the supraspinatus tendon.

## 2020-06-15 ENCOUNTER — Other Ambulatory Visit: Payer: Self-pay | Admitting: Internal Medicine

## 2020-06-15 DIAGNOSIS — Z1231 Encounter for screening mammogram for malignant neoplasm of breast: Secondary | ICD-10-CM

## 2020-07-04 ENCOUNTER — Other Ambulatory Visit: Payer: Self-pay

## 2020-07-04 ENCOUNTER — Ambulatory Visit
Admission: RE | Admit: 2020-07-04 | Discharge: 2020-07-04 | Disposition: A | Discharge status: Home / Self Care | Payer: 59 | Source: Ambulatory Visit | Attending: Internal Medicine | Admitting: Internal Medicine

## 2020-07-04 DIAGNOSIS — Z1231 Encounter for screening mammogram for malignant neoplasm of breast: Secondary | ICD-10-CM | POA: Insufficient documentation

## 2020-08-02 ENCOUNTER — Other Ambulatory Visit: Payer: Self-pay | Admitting: Family Medicine

## 2020-08-04 ENCOUNTER — Other Ambulatory Visit: Payer: Self-pay | Admitting: Dermatology

## 2020-08-04 ENCOUNTER — Ambulatory Visit (INDEPENDENT_AMBULATORY_CARE_PROVIDER_SITE_OTHER): Payer: 59 | Admitting: Dermatology

## 2020-08-04 ENCOUNTER — Other Ambulatory Visit: Payer: Self-pay

## 2020-08-04 DIAGNOSIS — L720 Epidermal cyst: Secondary | ICD-10-CM | POA: Diagnosis not present

## 2020-08-04 DIAGNOSIS — L723 Sebaceous cyst: Secondary | ICD-10-CM

## 2020-08-04 DIAGNOSIS — L821 Other seborrheic keratosis: Secondary | ICD-10-CM | POA: Diagnosis not present

## 2020-08-04 DIAGNOSIS — L732 Hidradenitis suppurativa: Secondary | ICD-10-CM | POA: Diagnosis not present

## 2020-08-04 MED ORDER — CLINDAMYCIN PHOSPHATE 1 % EX SOLN: Freq: Every day | CUTANEOUS | 1 refills | Status: DC

## 2020-08-04 NOTE — Progress Notes (Signed)
Follow-Up Visit   Subjective  Charlene Daniels is a 50 y.o. female who presents for the following: Skin Problem (Patient here today to follow up on dilated pore at right suprapubic. She was seen in our office just over a year ago and since then has drained gray pus 3 times. Patient advises it looks bruised and bleeds. Patient also with a mole at left side that seems rough and sometimes gets irritated due to  location. ).   The following portions of the chart were reviewed this encounter and updated as appropriate:   Tobacco  Allergies  Meds  Problems  Med Hx  Surg Hx  Fam Hx      Review of Systems:  No other skin or systemic complaints except as noted in HPI or Assessment and Plan.  Objective  Well appearing patient in no apparent distress; mood and affect are within normal limits.  A focused examination was performed including pubic, left side. Relevant physical exam findings are noted in the Assessment and Plan.  Objective  Right Suprapubic Pubic: 1.2cm Subcutaneous nodule.   Objective  right labia majora: Draining erythematous draining nodule  Objective  vaginal area: Few double comedones and scars at suprapubic and inguinal folds   Assessment & Plan  Epidermal inclusion cyst Right Suprapubic Pubic  Cyst with symptoms and/or recent change.  Discussed surgical excision to remove, including resulting scar and possible recurrence.  Patient will schedule for surgery.     Inflamed epidermoid cyst of skin right labia majora  Culture performed today  Incision and Drainage - right labia majora Location: right labia majora  Informed Consent: Discussed risks (permanent scarring, light or dark discoloration, infection, pain, bleeding, bruising, redness, damage to adjacent structures, and recurrence of the lesion) and benefits of the procedure, as well as the alternatives.  Informed consent was obtained.  Preparation: The area was prepped with alcohol.  Anesthesia:  Lidocaine 1% with epinephrine.   Procedure Details: An incision was made overlying the lesion. The lesion drained white, chalky cyst material and blood.  A small amount of fluid was drained.    Antibiotic ointment and a sterile pressure dressing were applied. The patient tolerated procedure well.  Total number of lesions drained: 1  Plan: The patient was instructed on post-op care. Recommend OTC analgesia as needed for pain.   Other Related Procedures Anaerobic and Aerobic Culture  Hidradenitis suppurativa vaginal area  Chronic condition with duration or expected duration over one year. Condition is bothersome to patient. Currently flared.  Hidradenitis Suppurativa is a chronic; persistent; non-curable, but treatable condition due to abnormal inflamed sweat glands in the body folds (axilla, inframammary, groin, medial thighs), causing recurrent painful cysts and scarring. It can be associated with severe scarring acne and cysts; abscesses and scarring of scalp. The goal is control and prevention of flares, as it is not curable. Scars are permanent and can be thickened. Treatment may include daily use of topical medication and oral antibiotics.  Oral isotretinoin may also be helpful.  For more severe cases, Humira (a biologic injection) may be prescribed to decrease the inflammatory process and prevent flares.  When indicated, inflamed cysts may also be treated surgically.  Patient advises she does get new tender spots about every 2-3 months.  Once area treated with I & D is healed, patient will start using clindamycin solution once daily.   Continue HibaClens to affected areas at groin, avoiding vaginal area. Continue Burnell Blanks at vaginal area.   Ordered Medications:  clindamycin (CLEOCIN T) 1 % external solution  Seborrheic Keratoses - Stuck-on, waxy, tan-brown papules and plaques at left abdomen - Discussed benign etiology and prognosis. - Observe - Call for any  changes  Return for Surgery for cyst right suprapubic.  Graciella Belton, RMA, am acting as scribe for Forest Gleason, MD .  Documentation: I have reviewed the above documentation for accuracy and completeness, and I agree with the above.  Forest Gleason, MD

## 2020-08-04 NOTE — Patient Instructions (Addendum)
If you have any questions or concerns for your doctor, please call our main line at [phone] and press option 4 to reach your doctor's medical assistant. If no one answers, please leave a voicemail as directed and we will return your call as soon as possible. Messages left after 4 pm will be answered the following business day.   You may also send [place] a message via [place]. We typically respond to MyChart messages within 1-2 business days.  For prescription refills, please ask your pharmacy to contact our office. Our fax number is [phone].  If you have an urgent issue when the clinic is closed that cannot wait until the next business day, you can page your doctor at the number below.    Please note that while we do our best to be available for urgent issues outside of office hours, we are not available 24/7.   If you have an urgent issue and are unable to reach [place], you may choose to seek medical care at your doctor's office, retail clinic, urgent care center, or emergency room.  If you have a medical emergency, please immediately call 911 or go to the emergency department.  Pager Numbers  - Dr. Nehemiah Massed: [phone]  - Dr. Laurence Ferrari: [phone]  - Dr. Nicole Kindred: [phone]  In the event of inclement weather, please call our main line at [phone] for an update on the status of any delays or closures.  Dermatology Medication Tips: Please keep the boxes that topical medications come in in order to help keep track of the instructions about where and how to use these. Pharmacies typically print the medication instructions only on the boxes and not directly on the medication tubes.   If your medication is too expensive, please contact our office at [phone] option 4 or send [place] a message through [place].   We are unable to tell what your co-pay for medications will be in advance as this is different depending on your insurance coverage. However, we may be able to find a substitute  medication at lower cost or fill out paperwork to get insurance to cover a needed medication.   If a prior authorization is required to get your medication covered by your insurance company, please allow [place] 1-2 business days to complete this process.  Drug prices often vary depending on where the prescription is filled and some pharmacies may offer cheaper prices.  The website www.goodrx.com contains coupons for medications through different pharmacies. The prices here do not account for what the cost may be with help from insurance (it may be cheaper with your insurance), but the website can give you the price if you did not use any insurance.  - You can print the associated coupon and take it with your prescription to the pharmacy.  - You may also stop by our office during regular business hours and pick up a GoodRx coupon card.  - If you need your prescription sent electronically to a different pharmacy, notify our office through [facility] or by phone at [phone] option 4.    Can take ibuprofen 600 mg up to every 8 hours as needed. Stay upright 30 minutes after taking.

## 2020-08-09 ENCOUNTER — Telehealth: Payer: Self-pay

## 2020-08-09 ENCOUNTER — Other Ambulatory Visit: Payer: Self-pay

## 2020-08-09 ENCOUNTER — Encounter: Payer: Self-pay | Admitting: Dermatology

## 2020-08-09 LAB — ANAEROBIC AND AEROBIC CULTURE

## 2020-08-09 MED ORDER — CIPROFLOXACIN HCL 500 MG PO TABS: 500.0000 mg | ORAL_TABLET | Freq: Two times a day (BID) | ORAL | 0 refills | Status: AC

## 2020-08-09 NOTE — Telephone Encounter (Signed)
Responded by MyChart

## 2020-08-09 NOTE — Telephone Encounter (Signed)
Tried to call patient twice, someone picked up then disconnected, Charlene Daniels

## 2020-08-09 NOTE — Telephone Encounter (Signed)
Spoke with pt and informed her of results. She states that the area is still sore and draining. I advised pt to d/c clindamycin and sent in cipro 500 mg BID x 7 days.   Pt is going to [place] this weekend and wanted to know if she would be able to swim if it is no longer draining?   OK to send answer in MyChart message.

## 2020-08-09 NOTE — Telephone Encounter (Signed)
-----   Message from Alfonso Patten, MD sent at 08/09/2020  8:55 AM EDT ----- Culture grew Staphylococcus epidermidis resistant to doxycycline, oxacillin and clindamycin. MAs please call patient. If she is doing well, do not recommend additional treatment after I&D.   However, if she still has pain at the area, would recommend d/c clindamycin and start ciprofloxacin 500 mg twice a day for 7 days.    Result 1 CommentAbnormal  Comment: Staphylococcus epidermidis  Based on resistance to oxacillin this isolate would be resistant to  all currently available beta-lactam antimicrobial agents, with the  exception of the newer cephalosporins with anti-MRSA activity, such as  Ceftaroline  Scant growth  Antimicrobial Susceptibility Comment  Comment:   ## S = Susceptible; I = Intermediate; R = Resistant ##           P = Positive; N = Negative        MICS are expressed in micrograms per mL   Antibiotic         RSLT#1  RSLT#2  RSLT#3  RSLT#4  Ciprofloxacin         S  Clindamycin          R  Erythromycin          R  Gentamicin           S  Levofloxacin          S  Linezolid           S  Moxifloxacin          S  Oxacillin           R  Penicillin           R  Quinupristin/Dalfopristin   S  Rifampin            S  Tetracycline          R  Trimethoprim/Sulfa       R  Vancomycin           S

## 2020-08-09 NOTE — Progress Notes (Signed)
Per result note, rx sent.

## 2020-08-16 ENCOUNTER — Encounter: Payer: Self-pay | Admitting: Dermatology

## 2020-08-24 ENCOUNTER — Ambulatory Visit: Payer: 59 | Admitting: Dermatology

## 2020-08-24 ENCOUNTER — Other Ambulatory Visit: Payer: Self-pay

## 2020-08-24 DIAGNOSIS — D485 Neoplasm of uncertain behavior of skin: Secondary | ICD-10-CM

## 2020-08-24 DIAGNOSIS — L928 Other granulomatous disorders of the skin and subcutaneous tissue: Secondary | ICD-10-CM | POA: Diagnosis not present

## 2020-08-24 MED ORDER — GENTAMICIN SULFATE 0.1 % EX OINT
1.0000 "application " | TOPICAL_OINTMENT | Freq: Every day | CUTANEOUS | 0 refills | Status: DC
  Filled 2020-08-24: qty 15, 15d supply, fill #0

## 2020-08-24 MED ORDER — GENTAMICIN SULFATE 0.1 % EX OINT
1.0000 | TOPICAL_OINTMENT | Freq: Every day | CUTANEOUS | 0 refills | Status: AC
Start: 2020-08-24 — End: ?

## 2020-08-24 MED ORDER — DOXYCYCLINE MONOHYDRATE 100 MG PO CAPS
100.0000 mg | ORAL_CAPSULE | Freq: Two times a day (BID) | ORAL | 1 refills | Status: DC
  Filled 2020-08-24: qty 10, 5d supply, fill #0

## 2020-08-24 MED ORDER — MUPIROCIN 2 % EX OINT
1.0000 "application " | TOPICAL_OINTMENT | Freq: Every day | CUTANEOUS | 0 refills | Status: AC
  Filled 2020-08-24: qty 22, 10d supply, fill #0

## 2020-08-24 NOTE — Progress Notes (Signed)
Gentamicin Oint unavailable at [facility]. Sent to [facility] per patient's request.

## 2020-08-24 NOTE — Patient Instructions (Addendum)

## 2020-08-24 NOTE — Progress Notes (Signed)
Follow-Up Visit   Subjective  Charlene Daniels is a 50 y.o. female who presents for the following: Procedure (Patient here today for excision of cyst vs other at suprapubic. ).  The following portions of the chart were reviewed this encounter and updated as appropriate:   Tobacco  Allergies  Meds  Problems  Med Hx  Surg Hx  Fam Hx      Review of Systems:  No other skin or systemic complaints except as noted in HPI or Assessment and Plan.  Objective  Well appearing patient in no apparent distress; mood and affect are within normal limits.  A focused examination was performed including pubic. Relevant physical exam findings are noted in the Assessment and Plan.  Objective  Right SupraPubic: 0.6cm subcutaneous papule   Assessment & Plan  Neoplasm of uncertain behavior of skin Right SupraPubic  Skin excision  Lesion length (cm):  1.5 Total excision diameter (cm):  1.5 Informed consent: discussed and consent obtained   Timeout: patient name, date of birth, surgical site, and procedure verified   Procedure prep:  Patient was prepped and draped in usual sterile fashion Prep type:  Chlorhexidine Anesthesia: the lesion was anesthetized in a standard fashion   Anesthesia comment:  1cc lido with epi, 5cc bupivicaine Anesthetic:  1% lidocaine w/ epinephrine 1-100,000 buffered w/ 8.4% NaHCO3 Instrument used comment:  41mm punch Hemostasis achieved with: suture, pressure and electrodesiccation   Outcome: patient tolerated procedure well with no complications   Post-procedure details: wound care instructions given   Additional details:  Mupirocin and a pressure dressing applied  Skin repair Complexity:  Intermediate Final length (cm):  2 Informed consent: discussed and consent obtained   Timeout: patient name, date of birth, surgical site, and procedure verified   Procedure prep:  Patient was prepped and draped in usual sterile fashion Prep type:  Chlorhexidine Anesthesia:  the lesion was anesthetized in a standard fashion   Anesthetic:  1% lidocaine w/ epinephrine 1-100,000 local infiltration (and bupivicaine) Reason for type of repair: reduce tension to allow closure, reduce the risk of dehiscence, infection, and necrosis, reduce subcutaneous dead space and avoid a hematoma, allow closure of the large defect, allow side-to-side closure without requiring a flap or graft and enhance both functionality and cosmetic results   Undermining: area extensively undermined   Subcutaneous layers (deep stitches):  Suture size:  4-0 Suture type: Vicryl (polyglactin 910)   Stitches:  Buried vertical mattress Fine/surface layer approximation (top stitches):  Suture size:  4-0 Suture type: Prolene (polypropylene)   Suture removal (days):  13 Hemostasis achieved with: pressure and electrodesiccation Outcome: patient tolerated procedure well with no complications   Post-procedure details: wound care instructions given   Additional details:  Mupirocin and a pressure bandage applied   Specimen 1 - Surgical pathology Differential Diagnosis: Cyst vs Other  Check Margins: No 0.6cm subcutaneous papule  Start doxycycline monohydrate 100mg  1 PO BID x 5 days with food. Start mupirocin daily. Start gentamicin daily.  Doxycycline should be taken with food to prevent nausea. Do not lay down for 30 minutes after taking. Be cautious with sun exposure and use good sun protection while on this medication. Pregnant women should not take this medication.     Ordered Medications: mupirocin ointment (BACTROBAN) 2 %  Other Related Medications gentamicin ointment (GARAMYCIN) 0.1 % doxycycline (MONODOX) 100 MG capsule  Return in about 13 days (around 09/06/2020) for Suture Removal.  Graciella Belton, RMA, am acting as scribe for [employer],  MD .  Documentation: I have reviewed the above documentation for accuracy and completeness, and I agree with the above.  Forest Gleason,  MD

## 2020-08-25 ENCOUNTER — Telehealth: Payer: Self-pay

## 2020-08-25 ENCOUNTER — Other Ambulatory Visit: Payer: Self-pay

## 2020-08-25 NOTE — Telephone Encounter (Signed)
Patient doing well following yesterday's surgery, JS 

## 2020-08-30 ENCOUNTER — Encounter: Payer: Self-pay | Admitting: Dermatology

## 2020-08-30 ENCOUNTER — Other Ambulatory Visit: Payer: Self-pay

## 2020-08-30 DIAGNOSIS — D485 Neoplasm of uncertain behavior of skin: Secondary | ICD-10-CM

## 2020-08-30 MED ORDER — DOXYCYCLINE MONOHYDRATE 100 MG PO CAPS: 100.0000 mg | ORAL_CAPSULE | Freq: Two times a day (BID) | ORAL | 0 refills | Status: AC

## 2020-08-30 MED ORDER — DOXYCYCLINE MONOHYDRATE 100 MG PO CAPS
100.0000 mg | ORAL_CAPSULE | Freq: Two times a day (BID) | ORAL | 0 refills | Status: DC
  Filled 2020-08-30: qty 20, 10d supply, fill #0

## 2020-08-30 NOTE — Telephone Encounter (Signed)
Pt called to have her Doxycycline tablets switched from [facility] to [place] erx'd Doxycyline Monohydrate 100 mg #20 tablets 0 RF to [facility] outpatient,   Pt also would like for Dr Laurence Ferrari to know she work week days from 7:30 am to 6 pm at [employer] and she can not come into the office, pt report she dont think the area is infected, so she will watch this area and keep her follow up scheduled appt for Tuesday April 19

## 2020-08-31 ENCOUNTER — Telehealth: Payer: Self-pay

## 2020-08-31 NOTE — Telephone Encounter (Signed)
-----   Message from Alfonso Patten, MD sent at 08/29/2020  3:04 PM EDT ----- Skin , right suprapubic FIBROSING GRANULATION TISSUE -- Deep scarring inflammation, no evidence of cancer  MAs please call. Thank you!

## 2020-08-31 NOTE — Telephone Encounter (Signed)
Patient advised pathology showed inflammation/granulation tissue, JS

## 2020-09-05 ENCOUNTER — Encounter: Payer: Self-pay | Admitting: Dermatology

## 2020-09-06 ENCOUNTER — Other Ambulatory Visit: Payer: Self-pay

## 2020-09-06 ENCOUNTER — Ambulatory Visit (INDEPENDENT_AMBULATORY_CARE_PROVIDER_SITE_OTHER): Payer: 59 | Admitting: Dermatology

## 2020-09-06 ENCOUNTER — Encounter: Payer: Self-pay | Admitting: Dermatology

## 2020-09-06 DIAGNOSIS — T148XXA Other injury of unspecified body region, initial encounter: Secondary | ICD-10-CM

## 2020-09-06 DIAGNOSIS — Z4802 Encounter for removal of sutures: Secondary | ICD-10-CM

## 2020-09-06 NOTE — Progress Notes (Signed)
   Follow-Up Visit   Subjective  Charlene Daniels is a 50 y.o. female who presents for the following: Follow-up (Patient here today for suture removal. ).  The following portions of the chart were reviewed this encounter and updated as appropriate:   Tobacco  Allergies  Meds  Problems  Med Hx  Surg Hx  Fam Hx      Review of Systems:  No other skin or systemic complaints except as noted in HPI or Assessment and Plan.  Objective  Well appearing patient in no apparent distress; mood and affect are within normal limits.  A focused examination was performed including groin. Relevant physical exam findings are noted in the Assessment and Plan.    Assessment & Plan  Open wound  Other Related Procedures Anaerobic and Aerobic Culture    Encounter for Removal of Sutures - Incision site at the right suprapubic is clean and intact with small amount of slightly purulent sanguinous discharge at inferior border - Wound cleansed, sutures removed, wound cleansed and steri strips applied.  - Discussed pathology results showing fibrosing granulation tissue  - Patient advised to keep steri-strips dry until they fall off. - Scars remodel for a full year. - Once steri-strips fall off, patient can apply over-the-counter silicone scar cream each night to help with scar remodeling if desired. - Patient advised to call with any concerns or if they notice any new or changing lesions. - Culture taken today at excision site - Continue doxycycline 100mg  BID until finished with course. May repeat course PRN if she develops any pain or increase in drainage after finishing it.     Return if symptoms worsen or fail to improve.  Graciella Belton, RMA, am acting as scribe for Forest Gleason, MD .  Documentation: I have reviewed the above documentation for accuracy and completeness, and I agree with the above.  Forest Gleason, MD

## 2020-09-06 NOTE — Patient Instructions (Signed)

## 2020-09-13 LAB — ANAEROBIC AND AEROBIC CULTURE

## 2020-09-14 ENCOUNTER — Telehealth: Payer: Self-pay

## 2020-09-14 NOTE — Telephone Encounter (Signed)
If no pain and no increase in drainage, ok to monitor. She should call for any worsening. Thank you

## 2020-09-14 NOTE — Telephone Encounter (Signed)
-----   Message from Virginia Moye, MD sent at 09/13/2020  9:17 PM EDT -----  Mixed skin flora  Comment: Scant growth  Small amount of normal skin bacteria. No evidence of infection.  MAs please call and see how Charlene Daniels is doing. If no pain or drainage at site, I am not concerned for infection. Thank you!    

## 2020-09-14 NOTE — Telephone Encounter (Signed)
Called to inform patient of culture results and to see how she was feeling. Patient states there is no redness or pain at site, but she is still having some drainage.   Routing to provider to advise on drainage.

## 2020-09-14 NOTE — Telephone Encounter (Signed)
-----   Message from Alfonso Patten, MD sent at 09/13/2020  9:17 PM EDT -----  Mixed skin flora  Comment: Scant growth  Small amount of normal skin bacteria. No evidence of infection.  MAs please call and see how Charlene Daniels is doing. If no pain or drainage at site, I am not concerned for infection. Thank you!

## 2020-09-15 NOTE — Telephone Encounter (Signed)
Called patient and informed of recommendations. She verbalized agreement and will call if any new concerns. No further questions at this time.

## 2021-06-12 ENCOUNTER — Other Ambulatory Visit: Payer: Self-pay | Admitting: Internal Medicine

## 2021-06-12 DIAGNOSIS — Z1231 Encounter for screening mammogram for malignant neoplasm of breast: Secondary | ICD-10-CM

## 2021-07-19 ENCOUNTER — Ambulatory Visit: Payer: 59

## 2021-08-30 ENCOUNTER — Ambulatory Visit
Admission: RE | Admit: 2021-08-30 | Discharge: 2021-08-30 | Disposition: A | Discharge status: Home / Self Care | Payer: 59 | Source: Ambulatory Visit | Attending: Internal Medicine | Admitting: Internal Medicine

## 2021-08-30 DIAGNOSIS — Z1231 Encounter for screening mammogram for malignant neoplasm of breast: Secondary | ICD-10-CM | POA: Diagnosis present

## 2022-07-26 ENCOUNTER — Ambulatory Visit: Payer: 59 | Admitting: Dermatology

## 2022-07-26 VITALS — BP 125/77 | HR 69

## 2022-07-26 DIAGNOSIS — L814 Other melanin hyperpigmentation: Secondary | ICD-10-CM | POA: Diagnosis not present

## 2022-07-26 DIAGNOSIS — L720 Epidermal cyst: Secondary | ICD-10-CM

## 2022-07-26 DIAGNOSIS — L578 Other skin changes due to chronic exposure to nonionizing radiation: Secondary | ICD-10-CM | POA: Diagnosis not present

## 2022-07-26 DIAGNOSIS — L821 Other seborrheic keratosis: Secondary | ICD-10-CM | POA: Diagnosis not present

## 2022-07-26 NOTE — Progress Notes (Signed)
   Follow-Up Visit   Subjective  Charlene Daniels is a 52 y.o. female who presents for the following: Skin Problem (Patient here today for a spot at hysterectomy scar. Originally came up as a pimple, sometimes feels a stinging sensation. ).  Patient also with a few rough spots at back that her husband has noticed.   The following portions of the chart were reviewed this encounter and updated as appropriate:   Tobacco  Allergies  Meds  Problems  Med Hx  Surg Hx  Fam Hx      Review of Systems:  No other skin or systemic complaints except as noted in HPI or Assessment and Plan.  Objective  Well appearing patient in no apparent distress; mood and affect are within normal limits.  A focused examination was performed including back, abdomen. Relevant physical exam findings are noted in the Assessment and Plan.  mid abdomen Subcutaneous nodule    Assessment & Plan  Epidermal inclusion cyst mid abdomen  Benign-appearing. Exam most consistent with an epidermal inclusion cyst. Discussed that a cyst is a benign growth that can grow over time and sometimes get irritated or inflamed. Recommend observation if it is not bothersome. Discussed option of surgical excision to remove it if it is growing, symptomatic, or other changes noted. Please call for new or changing lesions so they can be evaluated.    Seborrheic Keratoses - Stuck-on, waxy, tan-brown papules and/or plaques  - Benign-appearing - Discussed benign etiology and prognosis. - Observe - Call for any changes  Melanocytic Nevi - Tan-brown and/or pink-flesh-colored symmetric macules and papules - Benign appearing on exam today - Observation - Call clinic for new or changing moles - Recommend daily use of broad spectrum spf 30+ sunscreen to sun-exposed areas.   Actinic Damage - chronic, secondary to cumulative UV radiation exposure/sun exposure over time - diffuse scaly erythematous macules with underlying  dyspigmentation - Recommend daily broad spectrum sunscreen SPF 30+ to sun-exposed areas, reapply every 2 hours as needed.  - Recommend staying in the shade or wearing long sleeves, sun glasses (UVA+UVB protection) and wide brim hats (4-inch brim around the entire circumference of the hat). - Call for new or changing lesions. - Recommend taking Heliocare sun protection supplement daily in sunny weather for additional sun protection. For maximum protection on the sunniest days, you can take up to 2 capsules of regular Heliocare OR take 1 capsule of Heliocare Ultra. For prolonged exposure (such as a full day in the sun), you can repeat your dose of the supplement 4 hours after your first dose.  Return if symptoms worsen or fail to improve.  Graciella Belton, RMA, am acting as scribe for Forest Gleason, MD .  Documentation: I have reviewed the above documentation for accuracy and completeness, and I agree with the above.  Forest Gleason, MD

## 2022-07-26 NOTE — Patient Instructions (Addendum)
Recommend taking Heliocare sun protection supplement daily in sunny weather for additional sun protection. For maximum protection on the sunniest days, you can take up to 2 capsules of regular Heliocare OR take 1 capsule of Heliocare Ultra. For prolonged exposure (such as a full day in the sun), you can repeat your dose of the supplement 4 hours after your first dose. Heliocare can be purchased at [employer], at some [facility] or at [email or web address].    Some Recommended Sunscreens Include:  Good for Daily Wear (feels like lotion but NOT sweat resistant) Cerave AM Moisturizer with SPF EltaMD UV Lotion  Body or All Over Sunscreen EltaMD UV active for body and face Blue lizard sensitive Sun bum mineral (avoid if sensitive to scent) Aveeno Positively Mineral Neutrogena sheer zinc (Slightly harder to rub in) [facility] clear zinc (Slightly harder to rub in)  Clear Face Sunscreen EltaMD UV Elements CeraVe hydrating sunscreen 50 face  Tinted Face Sunscreen Alastin Hydratint (good for most skin tones, may be slightly dark if you are very fair) Colorescience Sunforgettable Total Protection Face Shield (good for most skin tones) EltaMD UV Physical La Roche Posay Mineral Tinted Cotz Flawless Complexion   Powder Sunscreen (Nice for reapplying or applying on the go) Colorescience Sunforgettable Total Protection Brush on Shield (available in different tints)  Face Sunscreen Available in Different Tints Colorescience Sunforgettable Total Protection Brush on Shield  bareMinerals Complexion Rescue Tinted Hydrating Gel Cream Broad Spectrum SPF 30 UnSun mineral tinted (comes in medium/dark and light/medium)  Kids (over 6 months) - Mineral Sunscreens Recommended eltaMD UV Pure MDsolarSciences KidStick 40 SPF Aveeno Baby Continuous Protection Sensitive Zinc Oxide Blue Lizard Kids mineral based sunscreen lotion Mustela Mineral Sunscreen for face and body Neutrogena Sheer Zinc Kids Sunscreen  Stick  Tinted to look like a tan PCAskin sheer tint body spray   Melanoma ABCDEs  Melanoma is the most dangerous type of skin cancer, and is the leading cause of death from skin disease.  You are more likely to develop melanoma if you: Have light-colored skin, light-colored eyes, or red or blond hair Spend a lot of time in the sun Tan regularly, either outdoors or in a tanning bed Have had blistering sunburns, especially during childhood Have a close family member who has had a melanoma Have atypical moles or large birthmarks  Early detection of melanoma is key since treatment is typically straightforward and cure rates are extremely high if we catch it early.   The first sign of melanoma is often a change in a mole or a new dark spot.  The ABCDE system is a way of remembering the signs of melanoma.  A for asymmetry:  The two halves do not match. B for border:  The edges of the growth are irregular. C for color:  A mixture of colors are present instead of an even brown color. D for diameter:  Melanomas are usually (but not always) greater than 67m - the size of a pencil eraser. E for evolution:  The spot keeps changing in size, shape, and color.  Please check your skin once per month between visits. You can use a small mirror in front and a large mirror behind you to keep an eye on the back side or your body.   If you see any new or changing lesions before your next follow-up, please call to schedule a visit.  Please continue daily skin protection including broad spectrum sunscreen SPF 30+ to sun-exposed areas, reapplying every 2 hours as needed  when you're outdoors.    Due to recent changes in healthcare laws, you may see results of your pathology and/or laboratory studies on MyChart before the doctors have had a chance to review them. We understand that in some cases there may be results that are confusing or concerning to you. Please understand that not all results are received at  the same time and often the doctors may need to interpret multiple results in order to provide you with the best plan of care or course of treatment. Therefore, we ask that you please give [place] 2 business days to thoroughly review all your results before contacting the office for clarification. Should we see a critical lab result, you will be contacted sooner.   If You Need Anything After Your Visit  If you have any questions or concerns for your doctor, please call our main line at [phone] and press option 4 to reach your doctor's medical assistant. If no one answers, please leave a voicemail as directed and we will return your call as soon as possible. Messages left after 4 pm will be answered the following business day.   You may also send [place] a message via [place]. We typically respond to MyChart messages within 1-2 business days.  For prescription refills, please ask your pharmacy to contact our office. Our fax number is [phone].  If you have an urgent issue when the clinic is closed that cannot wait until the next business day, you can page your doctor at the number below.    Please note that while we do our best to be available for urgent issues outside of office hours, we are not available 24/7.   If you have an urgent issue and are unable to reach [place], you may choose to seek medical care at your doctor's office, retail clinic, urgent care center, or emergency room.  If you have a medical emergency, please immediately call 911 or go to the emergency department.  Pager Numbers  - Dr. Nehemiah Massed: [phone]  - Dr. Laurence Ferrari: [phone]  - Dr. Nicole Kindred: [phone]  In the event of inclement weather, please call our main line at [phone] for an update on the status of any delays or closures.  Dermatology Medication Tips: Please keep the boxes that topical medications come in in order to help keep track of the instructions about where and how to use these. Pharmacies  typically print the medication instructions only on the boxes and not directly on the medication tubes.   If your medication is too expensive, please contact our office at [phone] option 4 or send [place] a message through [place].   We are unable to tell what your co-pay for medications will be in advance as this is different depending on your insurance coverage. However, we may be able to find a substitute medication at lower cost or fill out paperwork to get insurance to cover a needed medication.   If a prior authorization is required to get your medication covered by your insurance company, please allow [place] 1-2 business days to complete this process.  Drug prices often vary depending on where the prescription is filled and some pharmacies may offer cheaper prices.  The website www.goodrx.com contains coupons for medications through different pharmacies. The prices here do not account for what the cost may be with help from insurance (it may be cheaper with your insurance), but the website can give you the price if you did not use any insurance.  - You can print  the associated coupon and take it with your prescription to the pharmacy.  - You may also stop by our office during regular business hours and pick up a GoodRx coupon card.  - If you need your prescription sent electronically to a different pharmacy, notify our office through [facility] or by phone at [phone] option 4.     Si Usted Necesita Algo Despus de Su Visita  Tambin puede enviarnos un mensaje a travs de Pharmacist, community. Por lo general respondemos a los mensajes de MyChart en el transcurso de 1 a 2 das hbiles.  Para renovar recetas, por favor pida a su farmacia que se ponga en contacto con nuestra oficina. Harland Dingwall de fax es [place] [phone].  Si tiene un asunto urgente cuando la clnica est cerrada y que no puede esperar hasta el siguiente da hbil, puede llamar/localizar a su doctor(a) al nmero que  aparece a continuacin.   Por favor, tenga en cuenta que aunque hacemos todo lo posible para estar disponibles para asuntos urgentes fuera del horario de [place], no estamos disponibles las 24 horas del da, los 7 das de la [place].   Si tiene un problema urgente y no puede comunicarse con nosotros, puede optar por buscar atencin mdica  en el consultorio de su doctor(a), en una clnica privada, en un centro de atencin urgente o en una sala de emergencias.  Si tiene Engineering geologist, por favor llame inmediatamente al 911 o vaya a la sala de emergencias.  Nmeros de bper  - Dr. Nehemiah Massed: [phone]  - Dra. Moye: [phone]  - Dra. Nicole Kindred: [phone]  En caso de inclemencias del [place], por favor llame a Johnsie Kindred principal al [phone] para una actualizacin sobre el [place] de cualquier retraso o cierre.  Consejos para la medicacin en dermatologa: Por favor, guarde las cajas en las que vienen los medicamentos de uso tpico para ayudarle a seguir las instrucciones sobre dnde y cmo usarlos. Las farmacias generalmente imprimen las instrucciones del medicamento slo en las cajas y no directamente en los tubos del [place].   Si su medicamento es muy caro, por favor, pngase en contacto con Zigmund Daniel llamando al [phone] y presione la opcin 4 o envenos un mensaje a travs de Pharmacist, community.   No podemos decirle cul ser su copago por los medicamentos por adelantado ya que esto es diferente dependiendo de la cobertura de su seguro. Sin embargo, es posible que podamos encontrar un medicamento sustituto a Electrical engineer un formulario para que el seguro cubra el medicamento que se considera necesario.   Si se requiere una autorizacin previa para que su compaa de seguros [place] su medicamento, por favor permtanos de 1 a 2 das hbiles para completar este proceso.  Los precios de los medicamentos varan con frecuencia dependiendo del Environmental consultant de dnde se surte  la receta y alguna farmacias pueden ofrecer precios ms baratos.  El sitio web www.goodrx.com tiene cupones para medicamentos de Airline pilot. Los precios aqu no tienen en cuenta lo que podra costar con la ayuda del seguro (puede ser ms barato con su seguro), pero el sitio web puede darle el precio si no utiliz Research scientist (physical sciences).  - Puede imprimir el cupn correspondiente y llevarlo con su receta a la farmacia.  - Tambin puede pasar por nuestra oficina durante el horario de atencin regular y Charity fundraiser una tarjeta de cupones de GoodRx.  - Si necesita que su receta se enve electrnicamente a Chiropodist, informe a nuestra oficina  a travs de MyChart [place] o por telfono llamando al [phone] y presione la opcin 4.

## 2022-07-29 ENCOUNTER — Encounter: Payer: Self-pay | Admitting: Dermatology

## 2022-08-23 ENCOUNTER — Other Ambulatory Visit: Payer: Self-pay | Admitting: Internal Medicine

## 2022-08-23 ENCOUNTER — Ambulatory Visit
Admission: RE | Admit: 2022-08-23 | Discharge: 2022-08-23 | Disposition: A | Discharge status: Home / Self Care | Payer: 59 | Source: Ambulatory Visit | Attending: Internal Medicine | Admitting: Internal Medicine

## 2022-08-23 DIAGNOSIS — R0789 Other chest pain: Secondary | ICD-10-CM | POA: Insufficient documentation

## 2022-08-23 DIAGNOSIS — I10 Essential (primary) hypertension: Secondary | ICD-10-CM | POA: Insufficient documentation

## 2022-08-23 DIAGNOSIS — E782 Mixed hyperlipidemia: Secondary | ICD-10-CM | POA: Insufficient documentation

## 2022-08-23 DIAGNOSIS — E1169 Type 2 diabetes mellitus with other specified complication: Secondary | ICD-10-CM | POA: Insufficient documentation

## 2022-09-27 ENCOUNTER — Other Ambulatory Visit: Payer: Self-pay | Admitting: Internal Medicine

## 2022-09-27 DIAGNOSIS — Z1231 Encounter for screening mammogram for malignant neoplasm of breast: Secondary | ICD-10-CM

## 2022-10-12 ENCOUNTER — Ambulatory Visit
Admission: RE | Admit: 2022-10-12 | Discharge: 2022-10-12 | Disposition: A | Discharge status: Home / Self Care | Payer: 59 | Source: Ambulatory Visit | Attending: Internal Medicine | Admitting: Internal Medicine

## 2022-10-12 DIAGNOSIS — Z1231 Encounter for screening mammogram for malignant neoplasm of breast: Secondary | ICD-10-CM | POA: Insufficient documentation

## 2023-03-13 ENCOUNTER — Ambulatory Visit: Payer: 59 | Admitting: Dermatology

## 2023-05-17 ENCOUNTER — Other Ambulatory Visit: Payer: Self-pay

## 2023-07-12 ENCOUNTER — Encounter: Payer: Self-pay | Admitting: Internal Medicine

## 2023-07-23 ENCOUNTER — Ambulatory Visit: Admission: RE | Admit: 2023-07-23 | Payer: 59 | Source: Home / Self Care | Admitting: Internal Medicine

## 2023-07-23 SURGERY — COLONOSCOPY WITH PROPOFOL
Anesthesia: General

## 2023-10-08 ENCOUNTER — Other Ambulatory Visit: Payer: Self-pay | Admitting: Internal Medicine

## 2023-10-08 DIAGNOSIS — Z1231 Encounter for screening mammogram for malignant neoplasm of breast: Secondary | ICD-10-CM

## 2023-11-08 ENCOUNTER — Encounter: Payer: Self-pay | Admitting: Internal Medicine

## 2023-11-11 ENCOUNTER — Ambulatory Visit
Admission: RE | Admit: 2023-11-11 | Discharge: 2023-11-11 | Disposition: A | Discharge status: Home / Self Care | Source: Ambulatory Visit | Attending: Internal Medicine | Admitting: Internal Medicine

## 2023-11-11 DIAGNOSIS — Z1231 Encounter for screening mammogram for malignant neoplasm of breast: Secondary | ICD-10-CM | POA: Diagnosis present

## 2023-11-25 ENCOUNTER — Encounter: Payer: Self-pay | Admitting: Internal Medicine

## 2023-11-26 ENCOUNTER — Encounter: Payer: Self-pay | Admitting: Internal Medicine

## 2023-11-26 ENCOUNTER — Ambulatory Visit: Admitting: Certified Registered"

## 2023-11-26 ENCOUNTER — Ambulatory Visit
Admission: RE | Admit: 2023-11-26 | Discharge: 2023-11-26 | Disposition: A | Discharge status: Home / Self Care | Attending: Internal Medicine | Admitting: Internal Medicine

## 2023-11-26 ENCOUNTER — Other Ambulatory Visit: Payer: Self-pay

## 2023-11-26 ENCOUNTER — Encounter
Admission: RE | Disposition: A | Discharge status: Home / Self Care | Payer: Self-pay | Source: Home / Self Care | Attending: Internal Medicine

## 2023-11-26 DIAGNOSIS — E1122 Type 2 diabetes mellitus with diabetic chronic kidney disease: Secondary | ICD-10-CM | POA: Insufficient documentation

## 2023-11-26 DIAGNOSIS — N189 Chronic kidney disease, unspecified: Secondary | ICD-10-CM | POA: Insufficient documentation

## 2023-11-26 DIAGNOSIS — R194 Change in bowel habit: Secondary | ICD-10-CM | POA: Diagnosis not present

## 2023-11-26 DIAGNOSIS — K219 Gastro-esophageal reflux disease without esophagitis: Secondary | ICD-10-CM | POA: Diagnosis not present

## 2023-11-26 DIAGNOSIS — R1032 Left lower quadrant pain: Secondary | ICD-10-CM | POA: Insufficient documentation

## 2023-11-26 DIAGNOSIS — K222 Esophageal obstruction: Secondary | ICD-10-CM | POA: Diagnosis not present

## 2023-11-26 DIAGNOSIS — K3189 Other diseases of stomach and duodenum: Secondary | ICD-10-CM | POA: Diagnosis not present

## 2023-11-26 DIAGNOSIS — I129 Hypertensive chronic kidney disease with stage 1 through stage 4 chronic kidney disease, or unspecified chronic kidney disease: Secondary | ICD-10-CM | POA: Insufficient documentation

## 2023-11-26 DIAGNOSIS — K5730 Diverticulosis of large intestine without perforation or abscess without bleeding: Secondary | ICD-10-CM | POA: Insufficient documentation

## 2023-11-26 DIAGNOSIS — E785 Hyperlipidemia, unspecified: Secondary | ICD-10-CM | POA: Diagnosis not present

## 2023-11-26 HISTORY — PX: COLONOSCOPY: SHX5424

## 2023-11-26 HISTORY — PX: MALONEY DILATION: SHX5535

## 2023-11-26 HISTORY — PX: ESOPHAGOGASTRODUODENOSCOPY: SHX5428

## 2023-11-26 LAB — GLUCOSE, CAPILLARY: Glucose-Capillary: 196 mg/dL — ABNORMAL HIGH (ref 70–99)

## 2023-11-26 SURGERY — COLONOSCOPY
Anesthesia: General

## 2023-11-26 MED ORDER — PROPOFOL 10 MG/ML IV BOLUS
INTRAVENOUS | Status: DC | PRN
  Administered 2023-11-26 (×5): 40 mg via INTRAVENOUS
  Administered 2023-11-26 (×2): 50 mg via INTRAVENOUS
  Administered 2023-11-26 (×3): 40 mg via INTRAVENOUS

## 2023-11-26 MED ORDER — SODIUM CHLORIDE 0.9 % IV SOLN: INTRAVENOUS | Status: DC

## 2023-11-26 MED ORDER — LIDOCAINE HCL (PF) 2 % IJ SOLN
INTRAMUSCULAR | Status: DC | PRN
  Administered 2023-11-26: 40 mg via INTRADERMAL

## 2023-11-26 NOTE — Anesthesia Preprocedure Evaluation (Signed)
 Anesthesia Evaluation  Patient identified by MRN, date of birth, ID band Patient awake    Reviewed: Allergy & Precautions, H&P , NPO status , Patient's Chart, lab work & pertinent test results, reviewed documented beta blocker date and time   History of Anesthesia Complications (+) PONV and history of anesthetic complications  Airway Mallampati: III   Neck ROM: full    Dental  (+) Poor Dentition   Pulmonary neg pulmonary ROS   Pulmonary exam normal        Cardiovascular Exercise Tolerance: Good hypertension, On Medications negative cardio ROS Normal cardiovascular exam Rhythm:regular Rate:Normal     Neuro/Psych  Headaches  negative psych ROS   GI/Hepatic negative GI ROS, Neg liver ROS,,,  Endo/Other  negative endocrine ROSdiabetes, Well Controlled    Renal/GU Renal disease  negative genitourinary   Musculoskeletal   Abdominal   Peds  Hematology negative hematology ROS (+)   Anesthesia Other Findings Past Medical History: No date: Arthritis No date: Chronic kidney disease     Comment:  on meds to protect kidney from DM No date: Colitis, Clostridium difficile No date: DDD (degenerative disc disease), lumbar No date: Diabetes mellitus without complication (HCC) No date: Diverticulitis 03/06/2018: Family history of breast cancer No date: Family history of esophageal cancer 03/06/2018: Family history of ovarian cancer No date: GERD (gastroesophageal reflux disease) No date: Headache(784.0) No date: Hyperlipidemia No date: Hypertension No date: PCOS (polycystic ovarian syndrome) No date: PONV (postoperative nausea and vomiting) Past Surgical History: 2009: ABDOMINAL HYSTERECTOMY 1994: CHOLECYSTECTOMY 01/18/2020: COLONOSCOPY WITH PROPOFOL ; N/A     Comment:  Procedure: COLONOSCOPY WITH PROPOFOL ;  Surgeon: [place],               Ladell POUR, MD;  Location: [facility] ENDOSCOPY;  Service:               Gastroenterology;   Laterality: N/A; 1994: DILATION AND CURETTAGE OF UTERUS 01/18/2020: ESOPHAGOGASTRODUODENOSCOPY; N/A     Comment:  Procedure: ESOPHAGOGASTRODUODENOSCOPY (EGD);  Surgeon:               Toledo, Ladell POUR, MD;  Location: [facility] ENDOSCOPY;                Service: Gastroenterology;  Laterality: N/A; 1999: LYMPH NODE DISSECTION; Right     Comment:  right neck cervical LN dissection after Cat-scratch               fever 09/19/2017: SHOULDER ARTHROSCOPY WITH OPEN ROTATOR CUFF REPAIR; Right     Comment:  Procedure: SHOULDER ARTHROSCOPY WITH OPEN ROTATOR CUFF               REPAIR & SUBACROMINAL DECOMPRESSION.;  Surgeon:               Marchia Drivers, MD;  Location: [facility] ORS;  Service:               Orthopedics;  Laterality: Right; 02/12/2013: SHOULDER ARTHROSCOPY WITH ROTATOR CUFF REPAIR AND  SUBACROMIAL DECOMPRESSION; Left     Comment:  Procedure: LEFT SHOULDER ARTHROSCOPY WITH SUBACROMIAL               DECOMPRESSION, DISTAL CLAVICLE RESECTION, POSSIBLE BICEP               TENOTOMY, POSSIBLE ROTATOR CUFF REPAIR;  Surgeon: Drivers CHRISTELLA Pointer, MD;  Location: [facility] OR;  Service: Orthopedics;  Laterality: Left; 1998: TONSILLECTOMY   Reproductive/Obstetrics negative OB ROS                              Anesthesia Physical Anesthesia Plan  ASA: 3  Anesthesia Plan: General   Post-op Pain Management:    Induction:   PONV Risk Score and Plan:   Airway Management Planned:   Additional Equipment:   Intra-op Plan:   Post-operative Plan:   Informed Consent: I have reviewed the patients History and Physical, chart, labs and discussed the procedure including the risks, benefits and alternatives for the proposed anesthesia with the patient or authorized representative who has indicated his/her understanding and acceptance.     Dental Advisory Given  Plan Discussed with: CRNA  Anesthesia Plan Comments:         Anesthesia Quick Evaluation

## 2023-11-26 NOTE — H&P (Signed)
 Outpatient short stay form Pre-procedure 11/26/2023 9:01 AM Charlene Daniels K. Aundria, M.D.  Primary Physician: Oneil Pinal, M.D.  Reason for visit:  Abdominal pain, LLQ (left lower quadrant)  Chronic constipation  Diverticulosis of colon  Gastroesophageal reflux disease, unspecified whether esophagitis present     History of present illness:  11/13/2010: CT abdomen and pelvis: Indication left lower left upper quadrant abdominal pain. Imp:  1. The findings are consistent with inflammation of the distal aspect of the transverse colon most likely due to diverticulitis no other forms of colitis could be present. There is no evidence of perforation or obstruction nor  ileus. 2. I see no acute urinary tract abnormality nor acute hepatobiliary abnormality.   03/05/2017: CT abdomen pelvis with contrast: Indication left lower quadrant abdominal pain:IMPRESSION: Left colonic diverticulosis. Inflammatory stranding around the  distal descending colon compatible with active diverticulitis. No complicating features. Fatty infiltration of the liver. Shotty retroperitoneal lymph nodes, stable.  Prior hysterectomy and cholecystectomy.   -01/05/2019: CT abdomen pelvis with contrast: Indication severe nausea vomiting diarrhea mid epigastric pain for 5 days: IMPRESSION: Distal colonic diverticulosis without evidence of diverticulitis. Small umbilical hernia containing fat. No acute abnormalities. Liver normal in appearance.   - 01/18/2020: EGD: Indication screening for Barrett's, dysphagia, GERD: Impression: Esophageal mucosal variant. Benign-appearing esophageal stenosis. Dilated. 1 cm hiatal hernia. Erythematous mucosa in the stomach. Normal examined duodenum. Pathology: Esophagus proximal and distal, negative EOE, dysplasia or malignancy. No repeat.  -01/18/2020: Colonoscopy: Indication follow-up diverticulitis: Impression: Nonbleeding internal hemorrhoids. Diverticulosis in the sigmoid colon. Examined in a she was  otherwise normal. Repeat in 10 years.  HISTORY OF PRESENT ILLNESS   Charlene Daniels is a 53 year old female with history of tension, diabetes mellitus type 2, lymphadenopathy, GERD, diverticulitis, polycystic ovarian disease who is sent by her primary care provider for evaluation of constipation with rectal pressure.  Reports that since she had her upper endoscopy in 2021 she is swallowing much better. She is taking the pantoprazole. She will have a rare episode of heartburn with chest pain. No epigastric pain, nausea/vomiting, or melena.   Her main concern today is that she wants a repeat colonoscopy for change in bowel habits. Patient had a cholecystectomy 1994 and has had 5-8 postprandial diarrhea stools since then. She had CT documented diverticulitis in 2012 and 2018 for LUQ and LLQ abd severe pain. Her most recent CT for LLQ abd pain was negative for diverticulitis.  About 5 months ago her bowel habits changed to constipation. She had increase in her Mounjaro dose about 2 months ago. She reports her bowel changes started before the Mounjaro was begun. She felt like she had a diverticulitis attack on 04/17/2023 and was called in empiric Augmentin and that resolved her LLQ abd pain.  She has been treating constipation with a prebiotic and probiotic mix daily. She also takes a bowel cleanse called Plexus Pro Bio 5. She takes daily magnesium oxide 400 mg for leg cramps. She takes 2-4 senna tablets several times a week. She tried MiraLAX twice. On this regimen, she currently has a bowel movement every third day that is formed, hard and has to strain to pass the stool. No hematochezia.   She has felt LLQ abdominal tenderness over the last 4 days. Describes this as a 4 out of 10 sharp, cramping pain without radiation. Bowel movement does help relieve the pain but does not resolve it. She has had some mild nausea, and she vomited once on 12/28. She has had normal appetite and  diet no further nausea or  vomiting. No fevers or chills. No dysuria. No flank pain. No vaginal complaints. Denies risk of pregnancy. She is requesting antibiotics - Cipro  and Flagyl  today because she believes she has diverticulitis.     Current Facility-Administered Medications:    0.9 %  sodium chloride  infusion, , Intravenous, Continuous, [place], Mervyn Pflaum K, MD, Last Rate: 20 mL/hr at 11/26/23 0856, New Bag at 11/26/23 0856  Medications Prior to Admission  Medication Sig Dispense Refill Last Dose/Taking   acetaminophen  (TYLENOL ) 500 MG tablet Take 1,000 mg by mouth 2 (two) times daily as needed (for pain.).   11/25/2023   aspirin EC 81 MG tablet Take 81 mg by mouth at bedtime.   Past Week   cholecalciferol (VITAMIN D) 1000 units tablet Take 1,000 Units by mouth at bedtime.   Past Week   ergocalciferol (VITAMIN D2) 1.25 MG (50000 UT) capsule Take 50,000 Units by mouth once a week.   Past Week   estradiol (ESTRACE) 2 MG tablet Take 1 mg by mouth at bedtime.  99 Past Week   glimepiride (AMARYL) 2 MG tablet Take 2 mg by mouth daily at 12 noon.  98 Past Week   lisinopril-hydrochlorothiazide (PRINZIDE,ZESTORETIC) 20-12.5 MG per tablet Take 0.5 tablets by mouth at bedtime.    11/25/2023   metFORMIN (GLUCOPHAGE) 1000 MG tablet Take 1,000 mg by mouth 2 (two) times daily with a meal.   11/25/2023   pantoprazole (PROTONIX) 40 MG tablet Take 40 mg by mouth at bedtime.    11/25/2023   Probiotic Product (PROBIOTIC PO) Take 1 capsule by mouth at bedtime.   Past Week   spironolactone (ALDACTONE) 50 MG tablet Take 50 mg by mouth at bedtime.    11/25/2023   vitamin B-12 (CYANOCOBALAMIN) 1000 MCG tablet Take 1,000 mcg by mouth at bedtime.   Past Week   Ascorbic Acid (VITAMIN C PO) Take 1 tablet by mouth at bedtime. (Patient not taking: Reported on 11/26/2023)   Not Taking   calcium carbonate (TUMS EX) 750 MG chewable tablet Chew 1 tablet by mouth as needed for heartburn.      ciprofloxacin  (CIPRO ) 500 MG tablet Take 1 tablet (500 mg total) by mouth  2 (two) times daily. (Patient not taking: Reported on 11/26/2023) 14 tablet 0 Not Taking   Dulaglutide 0.75 MG/0.5ML SOPN Inject 0.75 mg into the skin once a week. (Patient not taking: Reported on 11/19/2023)   Not Taking   empagliflozin (JARDIANCE) 25 MG TABS tablet Take by mouth daily. (Patient not taking: Reported on 07/16/2023)      estradiol (ESTRACE) 2 MG tablet TAKE 1 TABLET BY MOUTH ONCE DAILY 90 tablet 98    Garlic 1000 MG CAPS Take 1,000 mg by mouth at bedtime.      gentamicin  ointment (GARAMYCIN ) 0.1 % Apply to the affected area(s) daily. 15 g 0    ibuprofen (ADVIL,MOTRIN) 200 MG tablet Take 800 mg by mouth 2 (two) times daily as needed (for pain.).      KRILL OIL PO Take 1 capsule by mouth at bedtime.      mupirocin  ointment (BACTROBAN ) 2 % Apply to the affected area(s) daily. 22 g 0    OVER THE COUNTER MEDICATION Take 5 drops by mouth 2 (two) times daily. CBD OIL      oxyCODONE  (OXY IR/ROXICODONE ) 5 MG immediate release tablet  (Patient not taking: Reported on 11/26/2023)  0 Not Taking   pantoprazole (PROTONIX) 40 MG tablet TAKE 1 TABLET BY MOUTH  2 TIMES DAILY. 180 tablet 1    rosuvastatin (CRESTOR) 5 MG tablet Take 5 mg by mouth daily. (Patient not taking: Reported on 11/26/2023)   Not Taking     Allergies  Allergen Reactions   Darvocet [Propoxyphene N-Acetaminophen ] Nausea Only and Other (See Comments)    headache   Ultram [Tramadol] Other (See Comments)    hallucinations   Sulfa Antibiotics Rash    Rash on wrist     Past Medical History:  Diagnosis Date   Arthritis    Chronic kidney disease    on meds to protect kidney from DM   Colitis, Clostridium difficile    DDD (degenerative disc disease), lumbar    Diabetes mellitus without complication (HCC)    Diverticulitis    Family history of breast cancer 03/06/2018   Family history of esophageal cancer    Family history of ovarian cancer 03/06/2018   GERD (gastroesophageal reflux disease)    Headache(784.0)     Hyperlipidemia    Hypertension    PCOS (polycystic ovarian syndrome)    PONV (postoperative nausea and vomiting)     Review of systems:  Otherwise negative.    Physical Exam  Gen: Alert, oriented. Appears stated age.  HEENT: [place]/AT. PERRLA. Lungs: CTA, no wheezes. CV: RR nl S1, S2. Abd: soft, benign, no masses. BS+ Ext: No edema. Pulses 2+    Planned procedures: Proceed with colonoscopy. The patient understands the nature of the planned procedure, indications, risks, alternatives and potential complications including but not limited to bleeding, infection, perforation, damage to internal organs and possible oversedation/side effects from anesthesia. The patient agrees and gives consent to proceed.  Please refer to procedure notes for findings, recommendations and patient disposition/instructions.     Remedios Mckone K. Aundria, M.D. Gastroenterology 11/26/2023  9:01 AM

## 2023-11-26 NOTE — Transfer of Care (Signed)
 Immediate Anesthesia Transfer of Care Note  Patient: Charlene Daniels  Procedure(s) Performed: COLONOSCOPY EGD (ESOPHAGOGASTRODUODENOSCOPY) DILATION, ESOPHAGUS, USING MALONEY DILATOR  Patient Location: PACU and Endoscopy Unit  Anesthesia Type:MAC  Level of Consciousness: awake and oriented  Airway & Oxygen Therapy: Patient Spontanous Breathing and Patient connected to nasal cannula oxygen  Post-op Assessment: Report given to RN and Post -op Vital signs reviewed and stable  Post vital signs: Reviewed and stable  Last Vitals:  Vitals Value Taken Time  BP 157/76   Temp    Pulse 69 11/26/23 09:41  Resp 17 11/26/23 09:41  SpO2 98 % 11/26/23 09:41  Vitals shown include unfiled device data.  Last Pain:  Vitals:   11/26/23 0836  TempSrc: Temporal  PainSc: 5          Complications: No notable events documented.

## 2023-11-26 NOTE — Op Note (Signed)
 [facility] Gastroenterology Patient Name: Charlene Daniels Procedure Date: 11/26/2023 8:58 AM MRN: [id number] Account #: [id number] Date of Birth: 07-11-1970 Admit Type: Outpatient Age: 53 Room: [facility] ENDO ROOM 1 Gender: Female Note Status: Finalized Instrument Name: Arvis [id number] Procedure:             Colonoscopy Indications:           Abdominal pain in the left lower quadrant, Change in                         bowel habits, Constipation Providers:             [name] Aundria MD, MD Referring MD:          [name], MD (Referring MD) Medicines:             Propofol  per Anesthesia Complications:         No immediate complications. Estimated blood loss: None. Procedure:             Pre-Anesthesia Assessment:                        - The risks and benefits of the procedure and the                         sedation options and risks were discussed with the                         patient. All questions were answered and informed                         consent was obtained.                        - Patient identification and proposed procedure were                         verified prior to the procedure by the nurse. The                         procedure was verified in the procedure room.                        - ASA Grade Assessment: III - A patient with severe                         systemic disease.                        - After reviewing the risks and benefits, the patient                         was deemed in satisfactory condition to undergo the                         procedure.                        After obtaining informed consent, the colonoscope was  passed under direct vision. Throughout the procedure,                         the patient's blood pressure, pulse, and oxygen                         saturations were monitored continuously. The                         Colonoscope was introduced through the anus and                          advanced to the the cecum, identified by appendiceal                         orifice and ileocecal valve. The colonoscopy was                         performed without difficulty. The patient tolerated                         the procedure well. The quality of the bowel                         preparation was adequate. The ileocecal valve,                         appendiceal orifice, and rectum were photographed. Findings:      The perianal and digital rectal examinations were normal. Pertinent       negatives include normal sphincter tone and no palpable rectal lesions.      Many small-mouthed diverticula were found in the sigmoid colon and       descending colon.      The exam was otherwise without abnormality on direct and retroflexion       views. Impression:            - Diverticulosis in the sigmoid colon and in the                         descending colon.                        - The examination was otherwise normal on direct and                         retroflexion views.                        - No specimens collected. Recommendation:        - Patient has a contact number available for                         emergencies. The signs and symptoms of potential                         delayed complications were discussed with the patient.                         Return to normal activities tomorrow. Written  discharge instructions were provided to the patient.                        - High fiber diet.                        - Continue present medications.                        - Miralax 1 capful (17 grams) in 8 ounces of water PO                         daily.                        - Monitor results to esophageal dilation                        - Return to my office in 4 months.                        - Telephone GI office to schedule appointment.                        - The findings and recommendations were discussed with                          the patient. Procedure Code(s):     --- Professional ---                        [phone], Colonoscopy, flexible; diagnostic, including                         collection of specimen(s) by brushing or washing, when                         performed (separate procedure) Diagnosis Code(s):     --- Professional ---                        K57.30, Diverticulosis of large intestine without                         perforation or abscess without bleeding                        K59.00, Constipation, unspecified                        R19.4, Change in bowel habit                        R10.32, Left lower quadrant pain CPT copyright 2022 [employer]. All rights reserved. The codes documented in this report are preliminary and upon coder review may  be revised to meet current compliance requirements. [name] MD, MD 11/26/2023 9:42:35 AM This report has been signed electronically. Number of Addenda: 0 Note Initiated On: 11/26/2023 8:58 AM Scope Withdrawal Time: 0 hours 7 minutes 52 seconds  Total Procedure Duration: 0 hours 15 minutes 7 seconds  Estimated Blood Loss:  Estimated blood loss: none.  [name]

## 2023-11-26 NOTE — Op Note (Signed)
 [facility] Gastroenterology Patient Name: Charlene Daniels Procedure Date: 11/26/2023 8:59 AM MRN: [id number] Account #: [id number] Date of Birth: 1970-10-30 Admit Type: Outpatient Age: 53 Room: [facility] ENDO ROOM 1 Gender: Female Note Status: Supervisor Override Instrument Name: Barnie Endoscope [id number] Procedure:             Upper GI endoscopy Indications:           Dysphagia, Gastro-esophageal reflux disease Providers:             Maud Rubendall K. Aundria MD, MD Referring MD:          [name], MD (Referring MD) Medicines:             Propofol  per Anesthesia Complications:         No immediate complications. Estimated blood loss:                         Minimal. Procedure:             Pre-Anesthesia Assessment:                        - The risks and benefits of the procedure and the                         sedation options and risks were discussed with the                         patient. All questions were answered and informed                         consent was obtained.                        - Patient identification and proposed procedure were                         verified prior to the procedure by the nurse. The                         procedure was verified in the procedure room.                        - ASA Grade Assessment: III - A patient with severe                         systemic disease.                        - After reviewing the risks and benefits, the patient                         was deemed in satisfactory condition to undergo the                         procedure.                        After obtaining informed consent, the endoscope was  passed under direct vision. Throughout the procedure,                         the patient's blood pressure, pulse, and oxygen                         saturations were monitored continuously. The Endoscope                         was introduced through the mouth, and advanced to the                          third part of duodenum. The upper GI endoscopy was                         accomplished without difficulty. The patient tolerated                         the procedure well. Findings:      No endoscopic abnormality was evident in the esophagus to explain the       patient's complaint of dysphagia. It was decided, however, to proceed       with dilation in the distal esophagus. The scope was withdrawn. Dilation       was performed with a Maloney dilator with no resistance at 54 Fr.      The exam of the esophagus was otherwise normal.      Patchy mildly erythematous mucosa without bleeding was found in the       entire examined stomach. Two biopsies were obtained with cold forceps       for diagnostic purposes random. Estimated blood loss was minimal.      The exam of the stomach was otherwise normal.      The examined duodenum was normal. Impression:            - No endoscopic esophageal abnormality to explain                         patient's dysphagia. Esophagus dilated. Dilated.                        - Erythematous mucosa in the stomach.                        - Normal examined duodenum.                        - Biopsies performed random. Recommendation:        - Await pathology results.                        - Monitor results to esophageal dilation                        - Proceed with colonoscopy Procedure Code(s):     --- Professional ---                        [id number], Esophagogastroduodenoscopy, flexible,  transoral; with biopsy, single or multiple                        [id number], Dilation of esophagus, by unguided sound or                         bougie, single or multiple passes Diagnosis Code(s):     --- Professional ---                        K21.9, Gastro-esophageal reflux disease without                         esophagitis                        R10.32, Left lower quadrant pain                        K31.89, Other diseases of stomach  and duodenum                        R13.10, Dysphagia, unspecified CPT copyright 2022 American [employer] Association. All rights reserved. The codes documented in this report are preliminary and upon coder review may  be revised to meet current compliance requirements. [name] MD, MD 11/26/2023 9:20:08 AM This report has been signed electronically. Number of Addenda: 0 Note Initiated On: 11/26/2023 8:59 AM Estimated Blood Loss:  Estimated blood loss was minimal.      [facility]

## 2023-11-27 ENCOUNTER — Encounter: Payer: Self-pay | Admitting: Internal Medicine

## 2023-11-27 LAB — SURGICAL PATHOLOGY

## 2023-12-02 NOTE — Anesthesia Postprocedure Evaluation (Signed)
 Anesthesia Post Note  Patient: Charlene Daniels  Procedure(s) Performed: COLONOSCOPY EGD (ESOPHAGOGASTRODUODENOSCOPY) DILATION, ESOPHAGUS, USING MALONEY DILATOR  Patient location during evaluation: PACU Anesthesia Type: General Level of consciousness: awake and alert Pain management: pain level controlled Vital Signs Assessment: post-procedure vital signs reviewed and stable Respiratory status: spontaneous breathing, nonlabored ventilation, respiratory function stable and patient connected to nasal cannula oxygen Cardiovascular status: blood pressure returned to baseline and stable Postop Assessment: no apparent nausea or vomiting Anesthetic complications: no   No notable events documented.   Last Vitals:  Vitals:   11/26/23 0951 11/26/23 1010  BP: 139/75 133/87  Pulse: 71   Resp: 19 13  Temp:    SpO2: 99%     Last Pain:  Vitals:   11/27/23 0718  TempSrc:   PainSc: 0-No pain                 Lynwood KANDICE Clause

## 2024-01-02 ENCOUNTER — Other Ambulatory Visit: Payer: Self-pay | Admitting: Internal Medicine

## 2024-01-02 DIAGNOSIS — R590 Localized enlarged lymph nodes: Secondary | ICD-10-CM

## 2024-01-02 DIAGNOSIS — E1169 Type 2 diabetes mellitus with other specified complication: Secondary | ICD-10-CM

## 2024-01-10 ENCOUNTER — Other Ambulatory Visit: Payer: Self-pay | Admitting: Rheumatology

## 2024-01-10 DIAGNOSIS — G8929 Other chronic pain: Secondary | ICD-10-CM

## 2024-01-10 DIAGNOSIS — Z872 Personal history of diseases of the skin and subcutaneous tissue: Secondary | ICD-10-CM

## 2024-01-13 ENCOUNTER — Encounter: Payer: Self-pay | Admitting: Rheumatology
